# Patient Record
Sex: Female | Born: 1950
Health system: Southern US, Community
[De-identification: ages and names within clinical notes are randomized; demographics above are authoritative.]

## PROBLEM LIST (undated history)

## (undated) DIAGNOSIS — F419 Anxiety disorder, unspecified: Secondary | ICD-10-CM

## (undated) DIAGNOSIS — F32A Depression, unspecified: Secondary | ICD-10-CM

## (undated) DIAGNOSIS — E079 Disorder of thyroid, unspecified: Secondary | ICD-10-CM

## (undated) DIAGNOSIS — I10 Essential (primary) hypertension: Secondary | ICD-10-CM

## (undated) DIAGNOSIS — M1990 Unspecified osteoarthritis, unspecified site: Secondary | ICD-10-CM

## (undated) DIAGNOSIS — F4321 Adjustment disorder with depressed mood: Secondary | ICD-10-CM

## (undated) DIAGNOSIS — D649 Anemia, unspecified: Secondary | ICD-10-CM

## (undated) DIAGNOSIS — Z87440 Personal history of urinary (tract) infections: Secondary | ICD-10-CM

## (undated) DIAGNOSIS — T4145XA Adverse effect of unspecified anesthetic, initial encounter: Secondary | ICD-10-CM

## (undated) DIAGNOSIS — R112 Nausea with vomiting, unspecified: Secondary | ICD-10-CM

## (undated) DIAGNOSIS — T8859XA Other complications of anesthesia, initial encounter: Secondary | ICD-10-CM

## (undated) DIAGNOSIS — F418 Other specified anxiety disorders: Secondary | ICD-10-CM

## (undated) DIAGNOSIS — Z9889 Other specified postprocedural states: Secondary | ICD-10-CM

## (undated) DIAGNOSIS — F329 Major depressive disorder, single episode, unspecified: Secondary | ICD-10-CM

## (undated) HISTORY — PX: TOTAL THYROIDECTOMY: SHX2547

## (undated) HISTORY — DX: Unspecified osteoarthritis, unspecified site: M19.90

## (undated) HISTORY — DX: Anxiety disorder, unspecified: F41.9

## (undated) HISTORY — PX: SHOULDER SURGERY: SHX246

## (undated) HISTORY — PX: PARATHYROIDECTOMY: SHX19

## (undated) HISTORY — DX: Disorder of thyroid, unspecified: E07.9

## (undated) HISTORY — DX: Adjustment disorder with depressed mood: F43.21

## (undated) HISTORY — PX: EYE SURGERY: SHX253

## (undated) HISTORY — PX: APPENDECTOMY: SHX54

## (undated) HISTORY — DX: Major depressive disorder, single episode, unspecified: F32.9

## (undated) HISTORY — PX: COLONOSCOPY: SHX174

## (undated) HISTORY — DX: Depression, unspecified: F32.A

## (undated) HISTORY — DX: Other specified anxiety disorders: F41.8

---

## 1992-02-28 HISTORY — PX: TUBAL LIGATION: SHX77

## 1993-02-27 HISTORY — PX: OTHER SURGICAL HISTORY: SHX169

## 1998-05-07 ENCOUNTER — Emergency Department (HOSPITAL_COMMUNITY): Admission: EM | Admit: 1998-05-07 | Discharge: 1998-05-07 | Payer: Self-pay | Admitting: *Deleted

## 1998-05-07 ENCOUNTER — Encounter: Payer: Self-pay | Admitting: Emergency Medicine

## 1998-11-24 ENCOUNTER — Other Ambulatory Visit: Admission: RE | Admit: 1998-11-24 | Discharge: 1998-11-24 | Payer: Self-pay | Admitting: Obstetrics and Gynecology

## 1999-02-28 HISTORY — PX: OTHER SURGICAL HISTORY: SHX169

## 1999-03-29 ENCOUNTER — Encounter: Admission: RE | Admit: 1999-03-29 | Discharge: 1999-03-29 | Payer: Self-pay | Admitting: Obstetrics and Gynecology

## 1999-03-29 ENCOUNTER — Encounter: Payer: Self-pay | Admitting: Obstetrics and Gynecology

## 1999-07-13 ENCOUNTER — Encounter: Payer: Self-pay | Admitting: Orthopedic Surgery

## 1999-07-19 ENCOUNTER — Inpatient Hospital Stay (HOSPITAL_COMMUNITY): Admission: RE | Admit: 1999-07-19 | Discharge: 1999-07-23 | Payer: Self-pay | Admitting: Orthopedic Surgery

## 1999-11-21 ENCOUNTER — Other Ambulatory Visit: Admission: RE | Admit: 1999-11-21 | Discharge: 1999-11-21 | Payer: Self-pay | Admitting: Obstetrics and Gynecology

## 2001-04-15 ENCOUNTER — Encounter: Payer: Self-pay | Admitting: Obstetrics and Gynecology

## 2001-04-15 ENCOUNTER — Encounter: Admission: RE | Admit: 2001-04-15 | Discharge: 2001-04-15 | Payer: Self-pay | Admitting: Obstetrics and Gynecology

## 2001-08-06 ENCOUNTER — Encounter: Admission: RE | Admit: 2001-08-06 | Discharge: 2001-08-06 | Payer: Self-pay | Admitting: Obstetrics and Gynecology

## 2001-09-19 ENCOUNTER — Other Ambulatory Visit: Admission: RE | Admit: 2001-09-19 | Discharge: 2001-09-19 | Payer: Self-pay | Admitting: Obstetrics and Gynecology

## 2001-09-27 HISTORY — PX: BREAST REDUCTION SURGERY: SHX8

## 2002-09-24 ENCOUNTER — Other Ambulatory Visit: Admission: RE | Admit: 2002-09-24 | Discharge: 2002-09-24 | Payer: Self-pay | Admitting: Obstetrics and Gynecology

## 2003-09-28 DIAGNOSIS — F418 Other specified anxiety disorders: Secondary | ICD-10-CM

## 2003-09-28 HISTORY — DX: Other specified anxiety disorders: F41.8

## 2003-09-30 ENCOUNTER — Other Ambulatory Visit: Admission: RE | Admit: 2003-09-30 | Discharge: 2003-09-30 | Payer: Self-pay | Admitting: Obstetrics and Gynecology

## 2003-11-11 ENCOUNTER — Encounter: Admission: RE | Admit: 2003-11-11 | Discharge: 2003-11-11 | Payer: Self-pay | Admitting: *Deleted

## 2005-01-02 ENCOUNTER — Other Ambulatory Visit: Admission: RE | Admit: 2005-01-02 | Discharge: 2005-01-02 | Payer: Self-pay | Admitting: Obstetrics and Gynecology

## 2006-04-26 ENCOUNTER — Other Ambulatory Visit: Admission: RE | Admit: 2006-04-26 | Discharge: 2006-04-26 | Payer: Self-pay | Admitting: Obstetrics & Gynecology

## 2006-05-15 ENCOUNTER — Encounter: Admission: RE | Admit: 2006-05-15 | Discharge: 2006-05-15 | Payer: Self-pay | Admitting: Obstetrics & Gynecology

## 2007-08-28 HISTORY — PX: THUMB FUSION: SUR636

## 2007-09-30 ENCOUNTER — Encounter: Payer: Self-pay | Admitting: Family Medicine

## 2007-10-01 ENCOUNTER — Encounter: Payer: Self-pay | Admitting: Family Medicine

## 2007-10-01 ENCOUNTER — Other Ambulatory Visit: Admission: RE | Admit: 2007-10-01 | Discharge: 2007-10-01 | Payer: Self-pay | Admitting: Obstetrics and Gynecology

## 2007-10-01 LAB — CONVERTED CEMR LAB: Pap Smear: NORMAL

## 2007-10-29 ENCOUNTER — Ambulatory Visit: Payer: Self-pay | Admitting: Family Medicine

## 2007-10-29 DIAGNOSIS — N951 Menopausal and female climacteric states: Secondary | ICD-10-CM | POA: Insufficient documentation

## 2007-10-29 DIAGNOSIS — M1990 Unspecified osteoarthritis, unspecified site: Secondary | ICD-10-CM | POA: Insufficient documentation

## 2007-12-12 ENCOUNTER — Encounter: Payer: Self-pay | Admitting: Family Medicine

## 2008-11-09 ENCOUNTER — Encounter: Admission: RE | Admit: 2008-11-09 | Discharge: 2008-11-09 | Payer: Self-pay | Admitting: Obstetrics & Gynecology

## 2010-03-20 ENCOUNTER — Encounter: Payer: Self-pay | Admitting: Endocrinology

## 2010-03-27 LAB — CONVERTED CEMR LAB
Albumin: 4.3 g/dL (ref 3.5–5.2)
CO2: 29 meq/L (ref 19–32)
Calcium: 11.2 mg/dL — ABNORMAL HIGH (ref 8.4–10.5)
GFR calc Af Amer: 74 mL/min
GFR calc non Af Amer: 61 mL/min
Glucose, Bld: 84 mg/dL (ref 70–99)
Potassium: 3.9 meq/L (ref 3.5–5.1)
Sodium: 142 meq/L (ref 135–145)
TSH: 1.56 microintl units/mL (ref 0.35–5.50)

## 2010-05-19 ENCOUNTER — Encounter: Payer: Self-pay | Admitting: Family Medicine

## 2010-05-19 ENCOUNTER — Ambulatory Visit (INDEPENDENT_AMBULATORY_CARE_PROVIDER_SITE_OTHER): Payer: PRIVATE HEALTH INSURANCE | Admitting: Family Medicine

## 2010-05-19 VITALS — BP 140/90 | HR 59 | Temp 98.1°F | Wt 130.0 lb

## 2010-05-19 DIAGNOSIS — E785 Hyperlipidemia, unspecified: Secondary | ICD-10-CM

## 2010-05-19 DIAGNOSIS — E7800 Pure hypercholesterolemia, unspecified: Secondary | ICD-10-CM

## 2010-05-19 DIAGNOSIS — E213 Hyperparathyroidism, unspecified: Secondary | ICD-10-CM

## 2010-05-19 DIAGNOSIS — N19 Unspecified kidney failure: Secondary | ICD-10-CM

## 2010-05-19 DIAGNOSIS — M129 Arthropathy, unspecified: Secondary | ICD-10-CM

## 2010-05-19 DIAGNOSIS — M1990 Unspecified osteoarthritis, unspecified site: Secondary | ICD-10-CM

## 2010-05-19 DIAGNOSIS — E039 Hypothyroidism, unspecified: Secondary | ICD-10-CM

## 2010-05-19 MED ORDER — DICLOFENAC SODIUM 75 MG PO TBEC: 75.0000 mg | DELAYED_RELEASE_TABLET | Freq: Two times a day (BID) | ORAL | Status: DC

## 2010-05-19 NOTE — Patient Instructions (Addendum)
Take celebrex until finished 1 twice daily then start diclofenac fo arthrits We'll call results of lab studies

## 2010-05-20 LAB — BASIC METABOLIC PANEL
BUN: 15 mg/dL (ref 6–23)
CO2: 27 mEq/L (ref 19–32)
Calcium: 10.2 mg/dL (ref 8.4–10.5)
Creatinine, Ser: 0.9 mg/dL (ref 0.4–1.2)
GFR: 67.34 mL/min (ref >60.00)
Glucose, Bld: 80 mg/dL (ref 70–99)
Sodium: 140 mEq/L (ref 135–145)

## 2010-05-20 LAB — HEPATIC FUNCTION PANEL
ALT: 19 U/L (ref 0–35)
Albumin: 4.4 g/dL (ref 3.5–5.2)
Bilirubin, Direct: 0.1 mg/dL (ref 0.0–0.3)
Total Protein: 7.1 g/dL (ref 6.0–8.3)

## 2010-05-20 LAB — LDL CHOLESTEROL, DIRECT: Direct LDL: 148.7 mg/dL

## 2010-05-20 LAB — LIPID PANEL
Cholesterol: 226 mg/dL — ABNORMAL HIGH (ref 0–200)
HDL: 65.3 mg/dL (ref >39.00)
Triglycerides: 70 mg/dL (ref 0.0–149.0)

## 2010-05-23 ENCOUNTER — Encounter: Payer: Self-pay | Admitting: Family Medicine

## 2010-05-23 NOTE — Progress Notes (Signed)
  Subjective:    Patient ID: Cassandra Mcfarland, female    DOB: 04-Jan-1951, 60 y.o.   MRN: [id number] This 60 year old white married tract is pleasant female applied to go obtained her left wrist which the left thumb has been fused also pain in the left knee which has persisted and she had been taken Aleve and Advil as needed for pain is never completely gone. She is postmenopausal and her menopausal symptoms are controlled with  Estrogen and testosterone combination of 0.625-0.25 mg Patient desires to have laboratory studies done especially parathyroid level which is been elevated in the past also lipids and calcium level be done, had been under the care of an endocrinologist Problem with hypercalcemia and passed it also having some anxiety and insomnia problems episodically HPI    Review of Systems patient has no complaints see history of present illness, review of systems negative otherwise     Objective:   Physical Exam patient is well well-nourished female who is in no distress but is complaining of pain of her left hand and left knee HEENT negative carotid pulses negative thyroid nonpalpable Heart pulse size no murmurs Lungs are clear to palpation percussion and auscultation no rales no wheezing Breasts not examined Abdominal exam negative liver spleen kidney nonpalpable no masses no tenderness Marked tenderness of the left wrist and base the left thumb Right knee replaced, tenderness left knee medial and lateral aspect No edema        Assessment & Plan:  Osteoarthritis to treat with Celebrex for 2 months and followup with diclofenac Hypercalcemia plan do have lab studies Hyperparathyroidism to evaluate level PTH Anxiety treat with alprazolam

## 2010-05-30 ENCOUNTER — Other Ambulatory Visit (HOSPITAL_COMMUNITY): Payer: Self-pay | Admitting: Endocrinology

## 2010-06-09 ENCOUNTER — Encounter (HOSPITAL_COMMUNITY)
Admission: RE | Admit: 2010-06-09 | Discharge: 2010-06-09 | Disposition: A | Discharge status: Home / Self Care | Payer: PRIVATE HEALTH INSURANCE | Source: Ambulatory Visit | Attending: Endocrinology | Admitting: Endocrinology

## 2010-06-09 ENCOUNTER — Ambulatory Visit (HOSPITAL_COMMUNITY)
Admission: RE | Admit: 2010-06-09 | Discharge: 2010-06-09 | Disposition: A | Discharge status: Home / Self Care | Payer: PRIVATE HEALTH INSURANCE | Source: Ambulatory Visit | Attending: Endocrinology | Admitting: Endocrinology

## 2010-06-09 DIAGNOSIS — E213 Hyperparathyroidism, unspecified: Secondary | ICD-10-CM | POA: Insufficient documentation

## 2010-06-09 MED ORDER — TECHNETIUM TC 99M SESTAMIBI - CARDIOLITE
25.0000 | Freq: Once | INTRAVENOUS | Status: AC | PRN
  Administered 2010-06-09: 17:00:00 26.2 via INTRAVENOUS

## 2010-06-20 ENCOUNTER — Encounter (HOSPITAL_COMMUNITY): Payer: PRIVATE HEALTH INSURANCE

## 2010-06-20 ENCOUNTER — Telehealth: Payer: Self-pay | Admitting: Family Medicine

## 2010-06-20 MED ORDER — ALPRAZOLAM 0.5 MG PO TABS: 0.5000 mg | ORAL_TABLET | Freq: Three times a day (TID) | ORAL | Status: DC | PRN

## 2010-06-20 NOTE — Telephone Encounter (Signed)
Patient would like for Dr Scotty Court to return her call. Has some issues that she would like to discuss.

## 2010-06-20 NOTE — Telephone Encounter (Signed)
Call from pharmasit at [facility] wanted to get xanax filled---ok to fill for 6 months per Dr. Scotty Court

## 2010-06-21 ENCOUNTER — Ambulatory Visit (HOSPITAL_COMMUNITY)
Admission: RE | Admit: 2010-06-21 | Discharge: 2010-06-21 | Disposition: A | Discharge status: Home / Self Care | Payer: PRIVATE HEALTH INSURANCE | Source: Ambulatory Visit | Attending: Surgery | Admitting: Surgery

## 2010-06-21 ENCOUNTER — Other Ambulatory Visit: Payer: Self-pay | Admitting: Surgery

## 2010-06-21 ENCOUNTER — Other Ambulatory Visit (HOSPITAL_COMMUNITY): Payer: Self-pay | Admitting: Surgery

## 2010-06-21 ENCOUNTER — Encounter (HOSPITAL_COMMUNITY): Payer: PRIVATE HEALTH INSURANCE

## 2010-06-21 DIAGNOSIS — E079 Disorder of thyroid, unspecified: Secondary | ICD-10-CM

## 2010-06-21 DIAGNOSIS — Z01812 Encounter for preprocedural laboratory examination: Secondary | ICD-10-CM | POA: Insufficient documentation

## 2010-06-21 DIAGNOSIS — Z01810 Encounter for preprocedural cardiovascular examination: Secondary | ICD-10-CM | POA: Insufficient documentation

## 2010-06-21 DIAGNOSIS — Z01818 Encounter for other preprocedural examination: Secondary | ICD-10-CM | POA: Insufficient documentation

## 2010-06-21 DIAGNOSIS — I10 Essential (primary) hypertension: Secondary | ICD-10-CM | POA: Insufficient documentation

## 2010-06-21 LAB — CBC
HCT: 41.6 % (ref 36.0–46.0)
Hemoglobin: 13.6 g/dL (ref 12.0–15.0)
MCH: 29.1 pg (ref 26.0–34.0)
MCHC: 32.7 g/dL (ref 30.0–36.0)
RBC: 4.67 MIL/uL (ref 3.87–5.11)

## 2010-06-21 LAB — DIFFERENTIAL
Eosinophils Absolute: 0.1 10*3/uL (ref 0.0–0.7)
Lymphs Abs: 1.9 10*3/uL (ref 0.7–4.0)
Monocytes Absolute: 0.3 10*3/uL (ref 0.1–1.0)
Monocytes Relative: 7 % (ref 3–12)
Neutrophils Relative %: 56 % (ref 43–77)

## 2010-06-21 LAB — BASIC METABOLIC PANEL
BUN: 11 mg/dL (ref 6–23)
CO2: 25 mEq/L (ref 19–32)
Calcium: 10.8 mg/dL — ABNORMAL HIGH (ref 8.4–10.5)
Glucose, Bld: 91 mg/dL (ref 70–99)
Potassium: 4.7 mEq/L (ref 3.5–5.1)
Sodium: 139 mEq/L (ref 135–145)

## 2010-06-21 LAB — URINALYSIS, ROUTINE W REFLEX MICROSCOPIC
Bilirubin Urine: NEGATIVE
Glucose, UA: NEGATIVE mg/dL
Hgb urine dipstick: NEGATIVE
Ketones, ur: NEGATIVE mg/dL
Nitrite: NEGATIVE
Specific Gravity, Urine: 1.011 (ref 1.005–1.030)
pH: 7 (ref 5.0–8.0)

## 2010-06-21 LAB — SURGICAL PCR SCREEN
MRSA, PCR: NEGATIVE
Staphylococcus aureus: NEGATIVE

## 2010-06-28 ENCOUNTER — Ambulatory Visit (HOSPITAL_COMMUNITY)
Admission: RE | Admit: 2010-06-28 | Discharge: 2010-06-28 | Disposition: A | Discharge status: Home / Self Care | Payer: PRIVATE HEALTH INSURANCE | Source: Ambulatory Visit | Attending: Surgery | Admitting: Surgery

## 2010-06-28 ENCOUNTER — Other Ambulatory Visit: Payer: Self-pay | Admitting: Surgery

## 2010-06-28 DIAGNOSIS — I10 Essential (primary) hypertension: Secondary | ICD-10-CM | POA: Insufficient documentation

## 2010-06-28 DIAGNOSIS — IMO0002 Reserved for concepts with insufficient information to code with codable children: Secondary | ICD-10-CM | POA: Insufficient documentation

## 2010-06-28 DIAGNOSIS — Z79899 Other long term (current) drug therapy: Secondary | ICD-10-CM | POA: Insufficient documentation

## 2010-06-28 DIAGNOSIS — E213 Hyperparathyroidism, unspecified: Secondary | ICD-10-CM | POA: Insufficient documentation

## 2010-06-28 DIAGNOSIS — Z96659 Presence of unspecified artificial knee joint: Secondary | ICD-10-CM | POA: Insufficient documentation

## 2010-06-28 DIAGNOSIS — M1710 Unilateral primary osteoarthritis, unspecified knee: Secondary | ICD-10-CM | POA: Insufficient documentation

## 2010-07-02 NOTE — Op Note (Signed)
Cassandra Mcfarland, Cassandra Mcfarland                ACCOUNT NO.:  [id number]  MEDICAL RECORD NO.:  [id number]           PATIENT TYPE:  O  LOCATION:  DAYL                         FACILITY:  [facility]  PHYSICIAN:  Velora Heckler, MD      DATE OF BIRTH:  1950-12-19  DATE OF PROCEDURE:  06/28/2010                               OPERATIVE REPORT   PREOPERATIVE DIAGNOSIS:  Primary hyperparathyroidism.  POSTOPERATIVE DIAGNOSIS:  Primary hyperparathyroidism.  PROCEDURE:  Left inferior minimally invasive parathyroidectomy.  SURGEON:  Velora Heckler, MD, FACS  ANESTHESIA:  General.  ESTIMATED BLOOD LOSS:  Minimal.  PREPARATION:  ChloraPrep.  COMPLICATIONS:  None.  INDICATIONS:  The patient is a 60 year old white female from [place], [place].  The patient has had elevated serum calcium levels for approximately 5 years.  This was picked up on routine laboratory work.  She also has hypertension which has become more difficult to control recently.  The patient was seen by Dr. Talmage Nap. Laboratory work showed an elevated calcium of 11.2 with intact PTH level of 112.  Nuclear medicine parathyroid scan was obtained which localized activity to the left inferior position.  The patient now comes to surgery for neck exploration.  BODY OF REPORT.:  Procedure was done in OR #1 at the [facility].  The patient was brought to the operating room, placed in supine position on the operating room table.  Following administration of general anesthesia, the patient was positioned and then prepped and draped in the usual strict aseptic fashion.  After ascertaining that an adequate level of anesthesia had been achieved, an inferior left neck incision was made with a #15 blade.  Dissection was carried through subcutaneous tissues and platysma.  Subplatysmal flaps were developed circumferentially with the electrocautery.  A Weitlaner retractor was placed for exposure.  Strap muscles were incised in  the midline and reflected to the left exposing the inferior pole of the left thyroid lobe.  Exploration just beneath the inferior pole in the tracheoesophageal groove reveals an enlarged parathyroid gland.  This was gently dissected out.  Vascular structures were divided between small Ligaclips and the gland was excised.  It measured approximately 1 cm in diameter.  It was submitted to pathology.  Frozen section by Dr. Miki Kins confirms hyperplastic parathyroid tissue consistent with parathyroid adenoma.  Good hemostasis was achieved.  Surgicel was placed in the operative field.  Strap muscles were reapproximated in the midline with interrupted 3-0 Vicryl sutures.  Platysma was closed with interrupted 3- 0 Vicryl sutures.  Localanesthetic was infiltrated circumferentially.  Skin was closed with running 4-0 Monocryl subcuticular suture.  Wound was washed and dried, and Benzoin and Steri-Strips were applied.  The patient was awakened from anesthesia and brought to the recovery room.  The patient tolerated the procedure well.   Velora Heckler, MD, FACS     TMG/MEDQ  D:  06/28/2010  T:  06/28/2010  Job:  [id number]  cc:   Dorisann Frames, M.D. Fax: [phone]  Ellin Saba., MD [address] [place] [address]  Electronically Signed by Darnell Level MD  on 07/02/2010 12:39:56 PM

## 2010-07-15 NOTE — Discharge Summary (Signed)
[facility]  Patient:    Cassandra Mcfarland, Cassandra Mcfarland                 MRN: [id number] Adm. Date:  [id number] Disc. Date: [id number] Attending:  Loanne Drilling Dictator:   Marveen Reeks Dasnoit, P.A.                           Discharge Summary  ADMISSION DIAGNOSIS:  End-stage osteoarthritic degeneration of the right knee.  DISCHARGE DIAGNOSIS:  End-stage osteoarthritic degeneration of the right knee.  OPERATION PERFORMED:  Right total knee replacement.  HISTORY:  Patient is a 60 year old white female who has been followed by Dr. Ollen Gross for continued right knee pain.  She has a history of right knee pain that dates back to approximately 12 to 15 years ago when she developed medial-sided pain with mechanical symptoms.  At that time, she was doing some very heavy-impact aerobics and heavy exercise.  She underwent previous arthroscopic surgery and found to have medial compartment arthritis with degeneration.  Her pain has been progressive.  She has undergone a series of Hyalgan injections, followed by a series of Synvisc injections without relief.  It was felt that she would eventually need to require total knee replacement.  She was referred to Dr. Zandra Abts for consideration of unicompartmental replacement.  She presented to Dr. Lequita Halt for a second opinion.  It was felt that because of her amount of degeneration, that she would need to undergo total knee replacement.  The procedure, risks, benefits, complications and postoperative course were discussed with the patient at length and she was in agreement with this plan.  LABORATORY AND X-RAY FINDINGS:  Labs on admission revealed a hemoglobin of 13.8 with hematocrit of 40.6, WBC of 5.8 and 249,000 platelets.  Coagulation studies within normal limits.  Chemistry profile revealed a slightly elevated calcium at 10.6 and was otherwise normal.  Urinalysis was normal.  EKG revealed normal sinus rhythm.  Chest  x-ray revealed no cardiopulmonary disease.  HOSPITAL COURSE:  Patient was admitted after undergoing the above-listed surgery Dr. Ollen Gross.  She was started on Ancef for antibiotic prophylaxis; she was also started on Coumadin for DVT prophylaxis.  Physical therapy was begun at weightbearing as tolerated.  First day postop, patient states she had a rough night.  Hemoglobin was 10.6.  She was afebrile with stable vital signs.  She was having a rough time with her CPM, especially with full extension.  Hemovac was clotted and was therefore removed without complication.  Her dressing was dry.  There was minimal swelling.  We added Protonix for some mild nausea and reflux.  She was placed in ice packs.  The following day, on Jul 21, 1999, she was afebrile, feeling much better that day.  Dressing was changed and the wound itself looked very good.  Hemoglobin was stable at 10.2 with hematocrit of 30.6.  There was no hemarthrosis. Physical therapy was progressing very nicely.  On Jul 22, 1999, her third day postop, hemoglobin was 9.7 with hematocrit of 29.4, doing well overall, her pain was improving and there were no complaints of shortness of breath, chest pain or calf pain.  Physical therapy was progressing nicely.  She had a good appetite, voiding without difficulty, no bowel movement yet but she was moving gas.  CPM was 0-65 degrees and she was tolerating this well.  On exam, her dressing was dry, she had minimal swelling, there was  no drainage, her calf was soft.  The following day, on Jul 23, 1999, her fourth day postop, she remained afebrile with stable vital signs, she was doing very well overall, her pain was markedly decreased, her mobility was improving, she had a good appetite and voiding well.  Her physical therapy had progressed to the point where she was ready for discharge from their standpoint.  There were no complaints of shortness of breath, chest pain or calf pain.  On  exam, her dressing was dry, neurovascular and motor functions were intact, her calf was soft.  Plan at this time:  The patient was felt to be stable and ready for discharge to home.  DISCHARGE MEDICATIONS: 1. Percocet 5 mg one or two p.o. q.4-6h. p.r.n. pain. 2. Robaxin 500 mg one p.o. q.8h. p.r.n. muscle spasms. 3. Coumadin 2.5 mg one p.o. q.d. at 6 p.m. or as directed.  DIET:  Regular.  ACTIVITY:  She will continue to ambulate with a walker.  She will be weightbearing as tolerated.  RETURN APPOINTMENT:  She will return to see Dr. Ollen Gross in our office in 10 days time or sooner; she will call for an appointment.  WOUND CARE:  She will continue with daily dressing changes.  Patient started showering on Jul 22, 1999 and she can continue this as tolerated.  ADDITIONAL INSTRUCTIONS:  She will be seen as an outpatient at [facility] for outpatient physical therapy; she will also come to [facility] for blood draws for her pro times, with the first being on Jul 26, 1999.  CONDITION ON DISCHARGE:  Stable and improving.  FINAL DISCHARGE DIAGNOSIS:  End-stage osteoarthritic degeneration of the right knee requiring right total knee replacement. DD:  07/26/99 TD:  07/29/99 Job: [id number]

## 2010-07-15 NOTE — Op Note (Signed)
[facility]  Patient:    Cassandra Mcfarland, Cassandra Mcfarland                 MRN: [id number] Proc. Date: 07/19/99 Adm. Date:  [id number] Disc. Date: [id number] Attending:  Ollen Gross V                           Operative Report  PREOPERATIVE DIAGNOSIS:  Osteoarthritis, right knee.  POSTOPERATIVE DIAGNOSIS:  Osteoarthritis, right knee.  PROCEDURE:  Right total knee arthroplasty.  SURGEON:  Georgena Spurling, M.D.  ASSISTANT:  Druscilla Brownie. Shela Nevin, P.A.  ANESTHESIA:  Spinal.  ESTIMATED BLOOD LOSS:  Minimal.  DRAIN:  Hemovac x 1.  TOURNIQUET TIME:  50 minutes at 350 mmHg.  COMPLICATIONS:  None.  CONDITION:  Stable to recovery.  BRIEF CLINICAL NOTE:  Cassandra Mcfarland is a 60 year old female who has had significant right knee pain for several years now.  She has failed arthroscopic debridement and multiple injections.  She is getting progressively increasing pain and has bone-on-bone changes medially.  She presents now for total knee arthroplasty.  DESCRIPTION OF PROCEDURE:  After successful administration of spinal anesthetic, a tourniquet was placed high on the right thigh and the right lower extremity prepped and draped in the usual sterile fashion.  The extremity was wrapped in Esmarch, knee flexed, the tourniquet inflated to 350 mmHg.  Standard midline incision was made, skin cut with a 10 blade through subcutaneous tissue to the level of the extensor mechanism.  A fresh blade was used to make a medial parapatellar arthrotomy.  Soft tissue over the medial proximal tibia, subperiosteal elevation to the joint line with a knife, and into the semimembranosus bursa with a curved osteotome.  The lateral patellofemoral ligament then cut, the patella everted, the knee flexed 90 degrees.  ACL and PCL were removed.  A drill was used to make a starting hole in the distal femur.  Of note, she has bone-on-bone changes in the medial compartment as well as defect down to bone  in the patella.  Once the drill hole was created, the canal was irrigated and then the liner guide placed 5 degrees right valgus.  Referencing off the femoral condyles, rotation was marked and the block pinned to remove 9 mm off the distal femur.  Distal femoral resection was made with an oscillating saw.  Sizing block was placed, and 2.5 was the most appropriate size.  The 3 degree external rotation jig matched her epicondylar axis.  The cutting block was placed and the anterior and posterior cuts made.  Tibia was then subluxed forward and the menisci are removed.  Extramedullary tibial alignment guide was then placed and referencing proximally at the medial aspect of the tibial tubercle and distally along the tibial crest and second metatarsal axis.  The block is subsequently pinned so as to remove 10 mm off the nondeficient lateral side. Resection was made with an oscillating saw.  A size 2.5 was the most appropriate tibial size also.  The intercondylar cutting block was then placed on the femur and intercondylar and chamfer cuts are made.  The femoral trial size 2.5 posterior stabilizer is placed with a 2.5 tibia and a 10 mm posterior stabilized rotating platform insert.  She achieved full extension with excellent varus-valgus balance, past 90 degrees of flexion.  At this point, the patella is everted, thickness measured to be 21, and a free-hand resection taken down to 11 mm.  A 38  mm template is placed, and the lug holes are drilled.  Trial patella is placed and tracks normally.  At this point, the trial components are removed, the tibial base plate is placed back on the tibia, and pinned into position and the modular drill and keel punch utilized.  Cut bony surface was then prepared with pulsatile lavage and cement mix.  Once ready for implantation, the tibia, then femur and patella cemented in place, and the patella was held with the patella clamp.  Extruded cement was removed.   Trial 10 mm insert was placed, the knee held in full extension, and all the extruded cement is removed.  Once the cement is fully hardened, the patella clamp is removed and the permanent permanent 10 mm posterior stabilized rotating platform inserted, placed into the tibial tray.  Once again, she has excellent stability throughout and full motion.  The wound was then copiously irrigated with antibiotic solution and the extensor mechanism closed over one limb of the Hemovac drain with interrupted #1 PDS.  The tourniquet was then released with a total time of 50 minutes.  The subcutaneous tissue was closed with interrupted 2-0 Vicryl, subcuticular with running 4-0 Monocryl.  The incision was clean and dry and Steri-Strips and a bulky sterile dressing applied.  Drain was hooked to suction and the patient was awakened, placed into a knee immobilizer, and transported to recovery room in stable condition. DD:  07/19/99 TD:  07/23/99 Job: [id number]

## 2010-07-15 NOTE — H&P (Signed)
[facility]  Patient:    Cassandra Mcfarland, Cassandra Mcfarland                      MRN: [id number] Adm. Date:  07/19/99 Attending:  Ollen Gross, M.D. Dictator:   Alexzandrew L. Julien Girt, P.A.-C. CC:         Ollen Gross, M.D.             Leroy Sea., M.D.                         History and Physical  Date of office visit and history and physical, Jul 13, 1999.  DATE OF BIRTH:  17-Apr-1950  CHIEF COMPLAINT:  Right knee pain.  HISTORY OF PRESENT ILLNESS:  The patient is a 60 year old female who comes in and is evaluated by Dr. Ollen Gross for a continued right knee pain.  She comes in for evaluation for surgery.  She has a history of right knee pain that dates back to approximately 12-15 years ago when she developed medial side pain and mechanical symptoms.  At that time she was doing very heavy impact aerobics and heavy exercise.  She was seen by Dr. Kristeen Miss in the past and underwent arthroscopic surgery and found to have medial compartment arthritis and degeneration.  Her pain has been progressive.  She has undergone a series of Hyalgan injections followed by a series of synvisc injections without relief.  It was felt at that time that she would eventually require total knee replacement arthroplasty.  She was subsequently referred to Dr. Zandra Abts at [facility] for consideration of a unicondylar replacement.  She came for a second opinion.  Risks and benefits of the procedures were discussed at length with the patient and after long consideration she has decided to proceed with total knee replacement arthroplasty.  Risks and benefits of the procedure have been reviewed with the patient and she has elected to proceed with surgery.  ALLERGIES:  TETANUS INJECTION caused swelling and breathing difficulty. INTOLERANCES:  CODEINE causes nausea.  CURRENT MEDICATIONS:  Glucosamine and vitamins.  PAST MEDICAL HISTORY:  Osteoarthritis.  History of right wrist  fracture. History of left rib fractures.  PAST SURGICAL HISTORY:  Appendectomy, tubal ligation, abdominoplasty, face lift, rhinoplasty, knee arthroscopy.  SOCIAL HISTORY:  Married, two children.  Denies the use of tobacco products. Very seldom intake of alcohol.  FAMILY HISTORY:  Father, age 62, deceased secondary to a gunshot wound accident.  Mother deceased, age 26, secondary to lymphocytic leukemia.  REVIEW OF SYSTEMS:  General:  No fever, chills, or night sweats.  Neuro:  No seizures, syncope, or paralysis.  Respiratory:  No shortness of breath, productive cough, or hemoptysis.  Cardiovascular:  No chest pain, angina, orthopnea.  GI:  No nausea, vomiting, diarrhea, or constipation.  GU:  No dysuria, hematuria, or discharge.  Musculoskeletal:  Pertinent to that of the right knee found in the history of present illness.  PHYSICAL EXAMINATION:  VITAL SIGNS:  Pulse 64, respirations 14, blood pressure 132/82.  GENERAL:  The patient is a 60 year old white female, thin frame, well-developed, well-nourished.  Appears to be in no acute distress.  She is alert, oriented and cooperative at the time of exam.  HEENT:  Normocephalic, atraumatic.  Pupils round, reactive.  Oropharynx is clear.  EOMs are intact.  NECK:  Supple.  CHEST:  Clear to auscultation and percussion.  HEART:  Regular rate and rhythm.  No murmurs.  ABDOMEN:  Soft, flat abdomen, nontender to palpation.  Bowel sounds present. No rebound or guarding.  RECTAL, BREASTS, GENITALIA:  Not done.  Not pertinent to present illness.  EXTREMITIES:  Significant to the right lower extremity.  The patients range of motion of the right knee is 0-135 degrees.  She has no palpable effusion. Tender to palpation along the medial joint line.  Motor function is intact. There is 5/5 strength in the right leg.  X-RAYS:  X-rays taken in the office show bone-on-bone degeneration and arthritic changes in the right knee.  IMPRESSION:   End-stage osteoarthritis, right knee.  PLAN: The patient will be admitted to [facility] to undergo a right total knee replacement arthroplasty.  The patient was seen by her medical physician, Dr. Scotty Court, for preoperative medical clearance.  She has been cleared to proceed with surgery.  Dr. Scotty Court will be notified of the room number on admission and will be consulted if needed for any medical assistance with this patient during the hospital course. DD:  07/14/99 TD:  07/14/99 Job: [id number]

## 2010-07-24 ENCOUNTER — Emergency Department (HOSPITAL_COMMUNITY)
Admission: EM | Admit: 2010-07-24 | Discharge: 2010-07-25 | Disposition: A | Discharge status: Home / Self Care | Payer: PRIVATE HEALTH INSURANCE | Attending: Emergency Medicine | Admitting: Emergency Medicine

## 2010-07-24 ENCOUNTER — Emergency Department (HOSPITAL_COMMUNITY): Payer: PRIVATE HEALTH INSURANCE

## 2010-07-24 DIAGNOSIS — I10 Essential (primary) hypertension: Secondary | ICD-10-CM | POA: Insufficient documentation

## 2010-07-24 DIAGNOSIS — R55 Syncope and collapse: Secondary | ICD-10-CM | POA: Insufficient documentation

## 2010-07-24 DIAGNOSIS — R040 Epistaxis: Secondary | ICD-10-CM | POA: Insufficient documentation

## 2010-07-24 DIAGNOSIS — R42 Dizziness and giddiness: Secondary | ICD-10-CM | POA: Insufficient documentation

## 2010-07-24 DIAGNOSIS — R110 Nausea: Secondary | ICD-10-CM | POA: Insufficient documentation

## 2010-07-24 LAB — URINALYSIS, ROUTINE W REFLEX MICROSCOPIC
Glucose, UA: NEGATIVE mg/dL
Ketones, ur: NEGATIVE mg/dL
Nitrite: NEGATIVE
Protein, ur: NEGATIVE mg/dL
Urobilinogen, UA: 0.2 mg/dL (ref 0.0–1.0)

## 2010-07-24 LAB — COMPREHENSIVE METABOLIC PANEL
ALT: 40 U/L — ABNORMAL HIGH (ref 0–35)
Alkaline Phosphatase: 55 U/L (ref 39–117)
BUN: 15 mg/dL (ref 6–23)
CO2: 22 mEq/L (ref 19–32)
Chloride: 105 mEq/L (ref 96–112)
GFR calc non Af Amer: >60 mL/min (ref >60)
Glucose, Bld: 101 mg/dL — ABNORMAL HIGH (ref 70–99)
Potassium: 3.5 mEq/L (ref 3.5–5.1)
Sodium: 140 mEq/L (ref 135–145)
Total Bilirubin: 0.2 mg/dL — ABNORMAL LOW (ref 0.3–1.2)

## 2010-07-24 LAB — DIFFERENTIAL
Basophils Absolute: 0 10*3/uL (ref 0.0–0.1)
Lymphocytes Relative: 41 % (ref 12–46)
Lymphs Abs: 2.4 10*3/uL (ref 0.7–4.0)
Neutro Abs: 2.9 10*3/uL (ref 1.7–7.7)

## 2010-07-24 LAB — CBC
HCT: 37.9 % (ref 36.0–46.0)
Hemoglobin: 12.9 g/dL (ref 12.0–15.0)
RBC: 4.31 MIL/uL (ref 3.87–5.11)
WBC: 5.8 10*3/uL (ref 4.0–10.5)

## 2010-07-26 ENCOUNTER — Ambulatory Visit (INDEPENDENT_AMBULATORY_CARE_PROVIDER_SITE_OTHER): Payer: PRIVATE HEALTH INSURANCE | Admitting: Family Medicine

## 2010-07-26 ENCOUNTER — Encounter: Payer: Self-pay | Admitting: Family Medicine

## 2010-07-26 VITALS — BP 160/90 | HR 81 | Temp 98.0°F | Wt 129.5 lb

## 2010-07-26 DIAGNOSIS — F411 Generalized anxiety disorder: Secondary | ICD-10-CM

## 2010-07-26 DIAGNOSIS — M7550 Bursitis of unspecified shoulder: Secondary | ICD-10-CM

## 2010-07-26 DIAGNOSIS — I10 Essential (primary) hypertension: Secondary | ICD-10-CM

## 2010-07-26 DIAGNOSIS — IMO0002 Reserved for concepts with insufficient information to code with codable children: Secondary | ICD-10-CM

## 2010-07-26 DIAGNOSIS — M75100 Unspecified rotator cuff tear or rupture of unspecified shoulder, not specified as traumatic: Secondary | ICD-10-CM

## 2010-07-26 DIAGNOSIS — F419 Anxiety disorder, unspecified: Secondary | ICD-10-CM

## 2010-07-26 MED ORDER — AMLODIPINE BESYLATE 10 MG PO TABS: ORAL_TABLET | ORAL | Status: AC

## 2010-07-26 NOTE — Patient Instructions (Addendum)
Acromioclavicular  bursitis left shoulder, no gym for 1 week Hypertension start 10mg  amlodypine daily appt for blood pressure by doctor on Tuesday Alprazolam 0.25 am and PM

## 2010-08-02 ENCOUNTER — Encounter: Payer: Self-pay | Admitting: Family Medicine

## 2010-08-02 ENCOUNTER — Ambulatory Visit (INDEPENDENT_AMBULATORY_CARE_PROVIDER_SITE_OTHER): Payer: PRIVATE HEALTH INSURANCE | Admitting: Family Medicine

## 2010-08-02 VITALS — Temp 97.7°F | Wt 132.0 lb

## 2010-08-02 DIAGNOSIS — M75100 Unspecified rotator cuff tear or rupture of unspecified shoulder, not specified as traumatic: Secondary | ICD-10-CM

## 2010-08-02 DIAGNOSIS — M7550 Bursitis of unspecified shoulder: Secondary | ICD-10-CM

## 2010-08-02 DIAGNOSIS — F419 Anxiety disorder, unspecified: Secondary | ICD-10-CM

## 2010-08-02 DIAGNOSIS — I10 Essential (primary) hypertension: Secondary | ICD-10-CM

## 2010-08-02 DIAGNOSIS — IMO0002 Reserved for concepts with insufficient information to code with codable children: Secondary | ICD-10-CM

## 2010-08-02 DIAGNOSIS — F411 Generalized anxiety disorder: Secondary | ICD-10-CM

## 2010-08-03 ENCOUNTER — Encounter (INDEPENDENT_AMBULATORY_CARE_PROVIDER_SITE_OTHER): Payer: Self-pay | Admitting: Surgery

## 2010-08-07 ENCOUNTER — Encounter: Payer: Self-pay | Admitting: Family Medicine

## 2010-08-07 MED ORDER — METHYLPREDNISOLONE ACETATE 80 MG/ML IJ SUSP: 120.0000 mg | Freq: Once | INTRAMUSCULAR | Status: DC

## 2010-08-07 NOTE — Progress Notes (Signed)
  Subjective:    Patient ID: Cassandra Mcfarland, female    DOB: 1951/01/21, 60 y.o.   MRN: [id number] This 60 year old white married female had 2 episodes of epistaxis 5 days ago and then a recurrent 3 days ago and was seen in the emergency room where she was found to have elevated blood curve her approximately 200, but has not had any bleeding since that time she is in today regarding 2 problems her blood pressure which is 160/90 as well as pain in the left shoulder. She been treated orally for acromioclavicular bursitis and that his rate turned and also painful on movement as well as at night Patient has no other complaint no headaches nor dizzinessHPI when seen in the hospital the patient was given Norvasc 10 mg p.o. As well as 5 mg metoprolol tartrate    Review of Systemssee history of present illness     Objective:   Physical Exampatient is a fit 24-year-old white female tract is cooperative and alert and no distress HEENT reveals examination of the nose a clotted area of an posterior left nostril but no congestion normal problem nasal Heart and lungs no abnormality Examination of the left shoulder reveals tenderness over the acromioclavicular bursa pain on posterior movement as well as elevation As stated above blood pressure 160/90        Assessment & Plan:  Acromioclavicular bursitis left shoulder injected with 120 mg Depo-Medrol +1.5 cc 1% lidocaine Hypertension amlodipine 10 mg q.d. Epistaxis under control Anxiety start alprazolam oh 0.25 mg in a.m. And p.m.

## 2010-08-15 NOTE — Patient Instructions (Signed)
Hypertension well controlled continue amlodipine 10 mg q. Day Subdeltoid bursitis is under control continue diclofenac 75 mg twice daily for at least another month

## 2010-08-15 NOTE — Progress Notes (Signed)
  Subjective:    Patient ID: Cassandra Mcfarland, female    DOB: 07-23-1950, 60 y.o.   MRN: [id number] This 60 year old white married female lives in the 2 followup on her recent diagnoses of hypertension as well as to discuss her progress of subdeltoid bursitis her lites also no problems with epistaxis recently no new complaints HPI    Review of Systems  Constitutional: Negative.   HENT: Negative.   Eyes: Negative.   Cardiovascular: Negative.   Gastrointestinal: Negative.   Genitourinary: Negative.   Musculoskeletal: Positive for arthralgias.  Skin: Negative.   Neurological: Negative.   Hematological: Negative.   Psychiatric/Behavioral: Negative.        Objective:   Physical Exam the patient is a well-developed wnourished white female who is in no distress cooperative and attractive blood pressure initially 140/80 repeated 136/80 Examination left shoulder reveals minimal tenderness over the subdeltoid bursa area full range of motion no limitations         Assessment & Plan:  Hypertension is well controlled continue amlodipine 10 mg each day Anxiety continue alprazolam as needed Menopausal syndrome continue Estratest h.s. And progesterone Continue diclofenac for least one month, diclofenac 75 mg b.i.d. pc

## 2010-09-05 ENCOUNTER — Encounter (INDEPENDENT_AMBULATORY_CARE_PROVIDER_SITE_OTHER): Payer: Self-pay | Admitting: Surgery

## 2010-09-05 ENCOUNTER — Ambulatory Visit (INDEPENDENT_AMBULATORY_CARE_PROVIDER_SITE_OTHER): Payer: PRIVATE HEALTH INSURANCE | Admitting: Surgery

## 2010-09-05 NOTE — Progress Notes (Signed)
HISTORY: Patient returns for followup having undergone minimally invasive parathyroidectomy on Jun 28, 2010. Laboratory studies on August 19, 2010 show a normal calcium of 9.9 and a low intact PTH level of 4.5.   PERTINENT REVIEW OF SYSTEMS: The patient complains of frequent headaches. She is having problems with control of her hypertension. She has been seen in the emergency room for nosebleeds. She has a scheduled followup appointment with her primary care physician to deal with these issues in the near future.   EXAM: Anterior neck shows a well-healed surgical incision with excellent cosmetic result. Palpation reveals no tenderness. There is no sinus seroma. There is no sign of infection. His quality is normal.   IMPRESSION: Status post minimally invasive parathyroidectomy with normalization of serum calcium levels   PLAN: Patient is released to full activity without restriction. She should have a serum calcium level obtained on a regular basis by her primary care physician. She will return to this office as needed.

## 2010-09-06 ENCOUNTER — Telehealth: Payer: Self-pay

## 2010-09-06 MED ORDER — LISINOPRIL 20 MG PO TABS: 20.0000 mg | ORAL_TABLET | Freq: Every day | ORAL | Status: DC

## 2010-09-06 NOTE — Telephone Encounter (Signed)
Pt called and stated her rx for BP is causing her to have terrible headaches, ankle swelling  And evere  leg cramps.  Pt states she went off her BP medicine for 4 days  And her symptoms went away but her BP went back up.  Pt states once she started the medicaiton again her symptoms came back.  Pt would like to know what she needs to do.    Per Dr. Scotty Court pt is to start Lisinopril 20 mg qd and leave off amlodipine for 2 days then start half a pill and make an appt for next week.    Pt is aware of change in medication. rx has been faxed to pharmacy.

## 2010-09-14 ENCOUNTER — Ambulatory Visit (INDEPENDENT_AMBULATORY_CARE_PROVIDER_SITE_OTHER): Payer: PRIVATE HEALTH INSURANCE | Admitting: Family Medicine

## 2010-09-14 ENCOUNTER — Encounter: Payer: Self-pay | Admitting: Family Medicine

## 2010-09-14 VITALS — BP 138/88 | HR 80 | Temp 98.1°F | Wt 128.0 lb

## 2010-09-14 DIAGNOSIS — I10 Essential (primary) hypertension: Secondary | ICD-10-CM

## 2010-09-14 DIAGNOSIS — T50905A Adverse effect of unspecified drugs, medicaments and biological substances, initial encounter: Secondary | ICD-10-CM

## 2010-09-14 DIAGNOSIS — R51 Headache: Secondary | ICD-10-CM

## 2010-09-14 DIAGNOSIS — M25512 Pain in left shoulder: Secondary | ICD-10-CM

## 2010-09-14 NOTE — Patient Instructions (Signed)
I am sorry you reacted to the Norvasc but we can observe  For a while  Checking at home or call and come in to the office to be checked by the nurse . In the meantime no medicine, if elevated to 150/ or more then start lisinopril 10 mg each day,call me when you start Get an appointment with Dr Butler Denmark

## 2010-10-25 ENCOUNTER — Ambulatory Visit (INDEPENDENT_AMBULATORY_CARE_PROVIDER_SITE_OTHER): Payer: PRIVATE HEALTH INSURANCE | Admitting: Family Medicine

## 2010-10-25 ENCOUNTER — Encounter: Payer: Self-pay | Admitting: Family Medicine

## 2010-10-25 VITALS — BP 130/88 | HR 65 | Temp 97.9°F | Wt 128.0 lb

## 2010-10-25 DIAGNOSIS — I10 Essential (primary) hypertension: Secondary | ICD-10-CM

## 2010-10-25 DIAGNOSIS — G8929 Other chronic pain: Secondary | ICD-10-CM

## 2010-10-25 DIAGNOSIS — F430 Acute stress reaction: Secondary | ICD-10-CM

## 2010-10-25 DIAGNOSIS — R51 Headache: Secondary | ICD-10-CM

## 2010-10-25 DIAGNOSIS — R0989 Other specified symptoms and signs involving the circulatory and respiratory systems: Secondary | ICD-10-CM

## 2010-10-25 MED ORDER — BUTALBITAL-APAP-CAFFEINE 50-325-40 MG PO TABS: ORAL_TABLET | ORAL | Status: AC

## 2010-10-25 MED ORDER — DIAZEPAM 5 MG PO TABS: ORAL_TABLET | ORAL | Status: DC

## 2010-10-25 MED ORDER — BUTALBITAL-APAP-CAFFEINE 50-325-40 MG PO TABS: ORAL_TABLET | ORAL | Status: DC

## 2010-10-25 NOTE — Patient Instructions (Signed)
You labile hypertension which is aggravated by your multiple areas of stress we have discussed the feel it would be with advantageous for you to take Valium 5 mg 3 times daily for stress or if you take alprazolam at bedtime leg off the Valium Start lisinopril 10 mg daily for hypertension Return in one month call is needed

## 2010-10-25 NOTE — Progress Notes (Signed)
  Subjective:    Patient ID: Cassandra Mcfarland, female    DOB: 1950-07-25, 60 y.o.   MRN: [id number] This 60 year old white married female is in today because her blood pressure is been fluctuating and was 170/102 today and the patient did have headache and blood pressure has been fluctuating from 1:30 to 170. The patient is under considerable stress with a new grandchild baby plus a very sick sister and dealing with a very stressful lawsuit. Patient also helps a daughter is in the cookie business Patient has been having headaches periodically and only has Demerol to take but rather have something not as strong. HPI    Review of Systems see history of present illness     Objective:   Physical Exam patient is a well-built well-nourished white female who appears to be in stress but no distress blood pressure x2 130/88 Heart normal sounds regular rhythm Lungs clear to palpation percussion auscultation        Assessment & Plan:  Labile hypertension to start lisinopril 10 mg so treat acute stress reaction resulting in headaches her with Valium 5 mg 3 times a day plus Fioricet as needed not over 4 for per day

## 2010-10-31 NOTE — Progress Notes (Signed)
  Subjective:    Patient ID: Cassandra Mcfarland, female    DOB: 08/27/1950, 60 y.o.   MRN: [id number] This 60 year old white married female who has had some anxiety but had rather severe symptoms from elevated blood pressure was seen in the emergency room and treated with amlodipine and milligram did respond to the treatment however since that time she has reacted to the medication is become shaky dizzy headachy Eric she'll also complains of pain under the left shoulder blade and has had previous shoulder problems subdeltoid bursitis and has been treated by Dr.Grammig in the his also had some legs hand cramping, no other symptoms on system review HPI    Review of Systems see history of his nose     Objective:   Physical Exam the patient is a well-built well-nourished white female contracted who appears to be having discomfort but no severe distress Pressure 138/88 Lungs clear palpation percussion auscultation no rales no dullness no wheezing heart examination revealed no abnormalitiexamination her some tenderness over the subdeltoid bursal area some discomfort on movement         Assessment & Plan:  Since the amlodipine caused you to be shaky dizzy and headache I recommend that she stop this and not take any medication and follow with regular blood pressure readings in fact return in one week to have blood pressure read by a nurse have give you a prescription for lisinopril 10 mg but did not start until we discuss it Subdeltoid bursitis left shoulder recommend seeing Dr. Butler Denmark Continue alprazolam for anxiety and tension

## 2010-11-22 ENCOUNTER — Other Ambulatory Visit: Payer: Self-pay | Admitting: Nurse Practitioner

## 2010-11-22 DIAGNOSIS — M8580 Other specified disorders of bone density and structure, unspecified site: Secondary | ICD-10-CM

## 2010-11-24 ENCOUNTER — Other Ambulatory Visit: Payer: Self-pay | Admitting: Obstetrics & Gynecology

## 2010-11-24 ENCOUNTER — Other Ambulatory Visit: Payer: Self-pay | Admitting: Nurse Practitioner

## 2010-11-24 DIAGNOSIS — Z1231 Encounter for screening mammogram for malignant neoplasm of breast: Secondary | ICD-10-CM

## 2010-12-01 ENCOUNTER — Ambulatory Visit
Admission: RE | Admit: 2010-12-01 | Discharge: 2010-12-01 | Disposition: A | Discharge status: Home / Self Care | Payer: PRIVATE HEALTH INSURANCE | Source: Ambulatory Visit | Attending: Nurse Practitioner | Admitting: Nurse Practitioner

## 2010-12-01 ENCOUNTER — Ambulatory Visit
Admission: RE | Admit: 2010-12-01 | Discharge: 2010-12-01 | Disposition: A | Discharge status: Home / Self Care | Payer: PRIVATE HEALTH INSURANCE | Source: Ambulatory Visit | Attending: Obstetrics & Gynecology | Admitting: Obstetrics & Gynecology

## 2010-12-01 DIAGNOSIS — Z1231 Encounter for screening mammogram for malignant neoplasm of breast: Secondary | ICD-10-CM

## 2010-12-01 DIAGNOSIS — M8580 Other specified disorders of bone density and structure, unspecified site: Secondary | ICD-10-CM

## 2010-12-05 ENCOUNTER — Other Ambulatory Visit: Payer: Self-pay | Admitting: Family Medicine

## 2010-12-06 ENCOUNTER — Other Ambulatory Visit: Payer: Self-pay | Admitting: Obstetrics & Gynecology

## 2010-12-06 DIAGNOSIS — R928 Other abnormal and inconclusive findings on diagnostic imaging of breast: Secondary | ICD-10-CM

## 2010-12-15 ENCOUNTER — Telehealth: Payer: Self-pay

## 2010-12-15 NOTE — Telephone Encounter (Signed)
Pt called and stated she went to Dr. Junie Bame and had a tear in her rotator cuff. Pt states she was put on Tramadol and is going to get an injection.  Pt stated she would like to know if it is okay to take tramadol with lisinopril.  Pt states her bp has been in the 118's and 112's. 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 Scotty Court.

## 2010-12-16 NOTE — Telephone Encounter (Signed)
Pt called and is req to get labs ordered for calcium,pth, cholesterol. Pt also is needing to speak with Dr Scotty Court asap re: MRI results. Pt needs labs drawn by 12/29/10.

## 2010-12-16 NOTE — Telephone Encounter (Signed)
Per Dr. Scotty Court ok for pt to take lisinopril and tramadol. Dr. Scotty Court request a report of MRI

## 2010-12-19 ENCOUNTER — Ambulatory Visit
Admission: RE | Admit: 2010-12-19 | Discharge: 2010-12-19 | Disposition: A | Discharge status: Home / Self Care | Payer: PRIVATE HEALTH INSURANCE | Source: Ambulatory Visit | Attending: Obstetrics & Gynecology | Admitting: Obstetrics & Gynecology

## 2010-12-19 DIAGNOSIS — R928 Other abnormal and inconclusive findings on diagnostic imaging of breast: Secondary | ICD-10-CM

## 2010-12-22 NOTE — Telephone Encounter (Signed)
Left a message for pt to return call 

## 2011-01-12 ENCOUNTER — Ambulatory Visit (INDEPENDENT_AMBULATORY_CARE_PROVIDER_SITE_OTHER): Payer: PRIVATE HEALTH INSURANCE | Admitting: Internal Medicine

## 2011-01-12 ENCOUNTER — Ambulatory Visit: Payer: PRIVATE HEALTH INSURANCE | Admitting: Family Medicine

## 2011-01-12 ENCOUNTER — Encounter: Payer: Self-pay | Admitting: Internal Medicine

## 2011-01-12 DIAGNOSIS — E039 Hypothyroidism, unspecified: Secondary | ICD-10-CM

## 2011-01-12 NOTE — Patient Instructions (Signed)
Limit your sodium (Salt) intake    It is important that you exercise regularly, at least 20 minutes 3 to 4 times per week.  If you develop chest pain or shortness of breath seek  medical attention.  Call or return to clinic prn if these symptoms worsen or fail to improve as anticipated.  

## 2011-01-12 NOTE — Progress Notes (Signed)
  Subjective:    Patient ID: Cassandra Mcfarland, female    DOB: [birth year], 60 y.o.   MRN: [id number]  HPI  60 year old patient who is seen today to discuss an abnormal MRI involving her left shoulder area. She has been followed by [facility] orthopedics and her left shoulder pain secondary to a partial rotator cuff tear has steadily improved. 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 This has been evaluated by orthopedics and they feel this lesion also is benign. She has a history of primary hyperparathyroidism and is status post parathyroidectomy. She also has treated hypothyroidism   Review of Systems  Constitutional: Negative.   HENT: Negative for hearing loss, congestion, sore throat, rhinorrhea, dental problem, sinus pressure and tinnitus.   Eyes: Negative for pain, discharge and visual disturbance.  Respiratory: Negative for cough and shortness of breath.   Cardiovascular: Negative for chest pain, palpitations and leg swelling.  Gastrointestinal: Negative for nausea, vomiting, abdominal pain, diarrhea, constipation, blood in stool and abdominal distention.  Genitourinary: Negative for dysuria, urgency, frequency, hematuria, flank pain, vaginal bleeding, vaginal discharge, difficulty urinating, vaginal pain and pelvic pain.  Musculoskeletal: Negative for joint swelling (left shoulder pain improving), arthralgias and gait problem.  Skin: Negative for rash.  Neurological: Negative for dizziness, syncope, speech difficulty, weakness, numbness and headaches.  Hematological: Negative for adenopathy.  Psychiatric/Behavioral: Negative for behavioral problems, dysphoric mood and agitation. The patient is not nervous/anxious.        Objective:   Physical Exam  Constitutional: She appears well-developed and well-nourished. No distress.          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 This was discussed and the patient reassured Status post parathyroidectomy. We'll check a calcium level and PTH Hypothyroidism. We'll check a followup TSH

## 2011-01-13 LAB — TSH: TSH: 1.53 u[IU]/mL (ref 0.35–5.50)

## 2011-01-18 ENCOUNTER — Telehealth: Payer: Self-pay | Admitting: Family Medicine

## 2011-01-18 NOTE — Telephone Encounter (Signed)
Pt requesting results of lab work.

## 2011-01-23 ENCOUNTER — Telehealth: Payer: Self-pay | Admitting: Family Medicine

## 2011-01-23 NOTE — Telephone Encounter (Signed)
Spoke with pt r/t labs - WNL

## 2011-01-23 NOTE — Telephone Encounter (Signed)
Pt called 11/21, but was still listed as a Scotty Court pt so the call may have been routed incorrectly. At any rate, pt had labs on 11/15 with Dr. Kirtland Bouchard and would like a call with results. Thanks!

## 2011-01-24 ENCOUNTER — Telehealth: Payer: Self-pay | Admitting: Internal Medicine

## 2011-01-24 NOTE — Telephone Encounter (Signed)
Faxed. Pt aware

## 2011-01-24 NOTE — Telephone Encounter (Signed)
Attempted to call pt.  Will try to call at a later time. Labs are stable.

## 2011-01-24 NOTE — Telephone Encounter (Signed)
Pt called and said that her endocrinologist, Dr Horald Pollen at [facility], is needing to get a copy of pts lab results faxed over to his office. The Fax # is [phone]

## 2011-02-03 ENCOUNTER — Encounter: Payer: Self-pay | Admitting: Internal Medicine

## 2011-05-10 DIAGNOSIS — H269 Unspecified cataract: Secondary | ICD-10-CM | POA: Insufficient documentation

## 2011-06-02 ENCOUNTER — Other Ambulatory Visit: Payer: Self-pay | Admitting: Family Medicine

## 2011-08-07 ENCOUNTER — Other Ambulatory Visit: Payer: Self-pay | Admitting: Family Medicine

## 2011-09-25 ENCOUNTER — Other Ambulatory Visit: Payer: Self-pay | Admitting: Family Medicine

## 2011-09-26 ENCOUNTER — Other Ambulatory Visit: Payer: Self-pay

## 2011-09-26 MED ORDER — LISINOPRIL 20 MG PO TABS: 20.0000 mg | ORAL_TABLET | Freq: Every day | ORAL | Status: DC

## 2011-09-26 NOTE — Telephone Encounter (Signed)
Needs to be seen to "establish care" - [place] transfer pt

## 2011-10-11 ENCOUNTER — Other Ambulatory Visit: Payer: Self-pay | Admitting: Family Medicine

## 2011-10-12 ENCOUNTER — Other Ambulatory Visit: Payer: Self-pay

## 2011-10-12 MED ORDER — DIAZEPAM 5 MG PO TABS: ORAL_TABLET | ORAL | Status: DC

## 2011-11-27 ENCOUNTER — Other Ambulatory Visit: Payer: Self-pay | Admitting: Internal Medicine

## 2011-12-14 ENCOUNTER — Telehealth: Payer: Self-pay

## 2011-12-14 ENCOUNTER — Telehealth: Payer: Self-pay | Admitting: Family Medicine

## 2011-12-14 DIAGNOSIS — E039 Hypothyroidism, unspecified: Secondary | ICD-10-CM

## 2011-12-14 NOTE — Telephone Encounter (Signed)
Per dr Amador Cunas - ok to do labs - fasting - order done - VM - LMTCB if questions - need lab only appt now and rov in 4 mos

## 2011-12-14 NOTE — Telephone Encounter (Signed)
Please call/notify patient that lab/test/procedure is normal;  suggest return office visit in one to 4 months

## 2011-12-14 NOTE — Telephone Encounter (Signed)
Pt calling. Last seen Nov. 2012. Would like repeat thyroid labs including calcium that she had at that time, as well lipids. States she does not necessarily want an OV, unless needed based on lab results. Does not want Vit D - just had it done at [facility]. Please advise.

## 2011-12-14 NOTE — Telephone Encounter (Signed)
Attempt to call pt- VM - LMTCB if questions- will send copy to home - no lipid was done.

## 2011-12-14 NOTE — Telephone Encounter (Signed)
Pt had Ca / PTH / TSH  And lipids - last seen 12/2010 ,no up coming appt - but will make appt if needed. These labs were done at last visit. Please advies

## 2011-12-19 ENCOUNTER — Other Ambulatory Visit (INDEPENDENT_AMBULATORY_CARE_PROVIDER_SITE_OTHER): Payer: PRIVATE HEALTH INSURANCE

## 2011-12-19 DIAGNOSIS — Z13220 Encounter for screening for lipoid disorders: Secondary | ICD-10-CM

## 2011-12-19 DIAGNOSIS — E039 Hypothyroidism, unspecified: Secondary | ICD-10-CM

## 2011-12-19 LAB — LIPID PANEL
Cholesterol: 289 mg/dL — ABNORMAL HIGH (ref 0–200)
HDL: 42.3 mg/dL (ref >39.00)
Total CHOL/HDL Ratio: 7
Triglycerides: 151 mg/dL — ABNORMAL HIGH (ref 0.0–149.0)

## 2011-12-20 LAB — PTH, INTACT AND CALCIUM: PTH: 26 pg/mL (ref 14.0–72.0)

## 2011-12-25 ENCOUNTER — Telehealth: Payer: Self-pay | Admitting: Internal Medicine

## 2011-12-25 NOTE — Telephone Encounter (Signed)
Forward to inform Copy sent to home

## 2011-12-25 NOTE — Progress Notes (Signed)
Quick Note:  Attempt to call patient lmtcb to discuss results and new med to start ______

## 2011-12-25 NOTE — Progress Notes (Signed)
Quick Note:  See phone note - pt aware ______

## 2011-12-25 NOTE — Telephone Encounter (Signed)
Please send copy of labs the patient. Suggest repeat fasting lipid profile in 6-8 weeks before considering treatment

## 2011-12-25 NOTE — Telephone Encounter (Signed)
Caller: [name]/Patient; Patient Name: Cassandra Mcfarland; PCP: Eleonore Chiquito [facility]); Best Callback Phone Number: [phone] States had blood work 10-22 and has not heard from office with results. Advised per EPIC, cholesterol was elevated/289. Advised Atorvastatin 40mg  1 daily #90 was sent to pharmacy. Patient thinks was mistake in blood work and states is going to think about it before starting med. States may schedule appointment to see doctor prior to starting mediicine. Advised other test results were normal.  Would like copy of lab results sent to her home. PLEASE SEND PATIENT A COPY OF LABS DONE 10-22.

## 2011-12-26 ENCOUNTER — Telehealth: Payer: Self-pay

## 2011-12-26 NOTE — Telephone Encounter (Signed)
Copy already sent to home adderess

## 2011-12-26 NOTE — Telephone Encounter (Signed)
Spoke with pt- ROV make to discuss lab and do a fasting lipid repeat.

## 2012-01-03 ENCOUNTER — Ambulatory Visit (INDEPENDENT_AMBULATORY_CARE_PROVIDER_SITE_OTHER): Payer: PRIVATE HEALTH INSURANCE | Admitting: Internal Medicine

## 2012-01-03 ENCOUNTER — Encounter: Payer: Self-pay | Admitting: Internal Medicine

## 2012-01-03 VITALS — BP 106/72 | Wt 130.0 lb

## 2012-01-03 DIAGNOSIS — Z23 Encounter for immunization: Secondary | ICD-10-CM

## 2012-01-03 DIAGNOSIS — I10 Essential (primary) hypertension: Secondary | ICD-10-CM

## 2012-01-03 DIAGNOSIS — E785 Hyperlipidemia, unspecified: Secondary | ICD-10-CM

## 2012-01-03 DIAGNOSIS — E039 Hypothyroidism, unspecified: Secondary | ICD-10-CM

## 2012-01-03 LAB — COMPREHENSIVE METABOLIC PANEL
AST: 24 U/L (ref 0–37)
BUN: 20 mg/dL (ref 6–23)
Calcium: 9.1 mg/dL (ref 8.4–10.5)
Chloride: 107 mEq/L (ref 96–112)
Creatinine, Ser: 1 mg/dL (ref 0.4–1.2)
GFR: 60.06 mL/min (ref >60.00)
Total Bilirubin: 0.7 mg/dL (ref 0.3–1.2)

## 2012-01-03 LAB — LIPID PANEL
HDL: 43 mg/dL (ref >39.00)
Triglycerides: 176 mg/dL — ABNORMAL HIGH (ref 0.0–149.0)
VLDL: 35.2 mg/dL (ref 0.0–40.0)

## 2012-01-03 NOTE — Patient Instructions (Signed)
Limit your sodium (Salt) intake    It is important that you exercise regularly, at least 20 minutes 3 to 4 times per week.  If you develop chest pain or shortness of breath seek  medical attention.  Please check your blood pressure on a regular basis.  If it is consistently greater than 150/90, please make an office appointment.  Cholesterol Cholesterol is a white, waxy, fat-like protein needed by your body in small amounts. The liver makes all the cholesterol you need. It is carried from the liver by the blood through the blood vessels. Deposits (plaque) may build up on blood vessel walls. This makes the arteries narrower and stiffer. Plaque increases the risk for heart attack and stroke. You cannot feel your cholesterol level even if it is very high. The only way to know is by a blood test to check your lipid (fats) levels. Once you know your cholesterol levels, you should keep a record of the test results. Work with your caregiver to to keep your levels in the desired range. WHAT THE RESULTS MEAN:  Total cholesterol is a rough measure of all the cholesterol in your blood.   LDL is the so-called bad cholesterol. This is the type that deposits cholesterol in the walls of the arteries. You want this level to be low.   HDL is the good cholesterol because it cleans the arteries and carries the LDL away. You want this level to be high.   Triglycerides are fat that the body can either burn for energy or store. High levels are closely linked to heart disease.  DESIRED LEVELS:  Total cholesterol below 200.   LDL below 100 for people at risk, below 70 for very high risk.   HDL above 50 is good, above 60 is best.   Triglycerides below 150.  HOW TO LOWER YOUR CHOLESTEROL:  Diet.   Choose fish or white meat chicken and [place], roasted or baked. Limit fatty cuts of red meat, fried foods, and processed meats, such as sausage and lunch meat.   Eat lots of fresh fruits and vegetables. Choose whole  grains, beans, pasta, potatoes and cereals.   Use only small amounts of olive, corn or canola oils. Avoid butter, mayonnaise, shortening or palm kernel oils. Avoid foods with trans-fats.   Use skim/nonfat milk and low-fat/nonfat yogurt and cheeses. Avoid whole milk, cream, ice cream, egg yolks and cheeses. Healthy desserts include angel food cake, ginger snaps, animal crackers, hard candy, popsicles, and low-fat/nonfat frozen yogurt. Avoid pastries, cakes, pies and cookies.   Exercise.   A regular program helps decrease LDL and raises HDL.   Helps with weight control.   Do things that increase your activity level like gardening, walking, or taking the stairs.   Medication.   May be prescribed by your caregiver to help lowering cholesterol and the risk for heart disease.   You may need medicine even if your levels are normal if you have several risk factors.  HOME CARE INSTRUCTIONS    Follow your diet and exercise programs as suggested by your caregiver.   Take medications as directed.   Have blood work done when your caregiver feels it is necessary.  MAKE SURE YOU:    Understand these instructions.   Will watch your condition.   Will get help right away if you are not doing well or get worse.  Document Released: 11/08/2000 Document Revised: 05/08/2011 Document Reviewed: 05/01/2007 [facility] Patient Information 2013 [place], [place].   Fat and Cholesterol  Control Diet Cholesterol levels in your body are determined significantly by your diet. Cholesterol levels may also be related to heart disease. The following material helps to explain this relationship and discusses what you can do to help keep your heart healthy. Not all cholesterol is bad. Low-density lipoprotein (LDL) cholesterol is the "bad" cholesterol. It may cause fatty deposits to build up inside your arteries. High-density lipoprotein (HDL) cholesterol is "good." It helps to remove the "bad" LDL cholesterol from your  blood. Cholesterol is a very important risk factor for heart disease. Other risk factors are high blood pressure, smoking, stress, heredity, and weight. The heart muscle gets its supply of blood through the coronary arteries. If your LDL cholesterol is high and your HDL cholesterol is low, you are at risk for having fatty deposits build up in your coronary arteries. This leaves less room through which blood can flow. Without sufficient blood and oxygen, the heart muscle cannot function properly and you may feel chest pains (angina pectoris). When a coronary artery closes up entirely, a part of the heart muscle may die causing a heart attack (myocardial infarction). CHECKING CHOLESTEROL When your caregiver sends your blood to a lab to be examined for cholesterol, a complete lipid (fat) profile may be done. With this test, the total amount of cholesterol and levels of LDL and HDL are determined. Triglycerides are a type of fat that circulates in the blood. They can also be used to determine heart disease risk. The list below describes what the numbers should be: Test: Total Cholesterol.  Less than 200 mg/dl.  Test: LDL "bad cholesterol."  Less than 100 mg/dl.   Less than 70 mg/dl if you are at very high risk of a heart attack or sudden cardiac death.  Test: HDL "good cholesterol."  Greater than 50 mg/dl for women.   Greater than 40 mg/dl for men.  Test: Triglycerides.  Less than 150 mg/dl.  CONTROLLING CHOLESTEROL WITH DIET Although exercise and lifestyle factors are important, your diet is key. That is because certain foods are known to raise cholesterol and others to lower it. The goal is to balance foods for their effect on cholesterol and more importantly, to replace saturated and trans fat with other types of fat, such as monounsaturated fat, polyunsaturated fat, and omega-3 fatty acids. On average, a person should consume no more than 15 to 17 g of saturated fat daily. Saturated and trans  fats are considered "bad" fats, and they will raise LDL cholesterol. Saturated fats are primarily found in animal products such as meats, butter, and cream. However, that does not mean you need to give up all your favorite foods. Today, there are good tasting, low-fat, low-cholesterol substitutes for most of the things you like to eat. Choose low-fat or nonfat alternatives. Choose round or loin cuts of red meat. These types of cuts are lowest in fat and cholesterol. Chicken (without the skin), fish, veal, and ground [place] breast are great choices. Eliminate fatty meats, such as hot dogs and salami. Even shellfish have little or no saturated fat. Have a 3 oz (85 g) portion when you eat lean meat, poultry, or fish. Trans fats are also called "partially hydrogenated oils." They are oils that have been scientifically manipulated so that they are solid at room temperature resulting in a longer shelf life and improved taste and texture of foods in which they are added. Trans fats are found in stick margarine, some tub margarines, cookies, crackers, and baked goods.  When baking and cooking, oils are a great substitute for butter. The monounsaturated oils are especially beneficial since it is believed they lower LDL and raise HDL. The oils you should avoid entirely are saturated tropical oils, such as coconut and palm.   Remember to eat a lot from food groups that are naturally free of saturated and trans fat, including fish, fruit, vegetables, beans, grains (barley, rice, couscous, bulgur wheat), and pasta (without cream sauces).   IDENTIFYING FOODS THAT LOWER CHOLESTEROL   Soluble fiber may lower your cholesterol. This type of fiber is found in fruits such as apples, vegetables such as broccoli, potatoes, and carrots, legumes such as beans, peas, and lentils, and grains such as barley. Foods fortified with plant sterols (phytosterol) may also lower cholesterol. You should eat at least 2 g per day of these foods  for a cholesterol lowering effect.   Read package labels to identify low-saturated fats, trans fat free, and low-fat foods at the supermarket. Select cheeses that have only 2 to 3 g saturated fat per ounce. Use a heart-healthy tub margarine that is free of trans fats or partially hydrogenated oil. When buying baked goods (cookies, crackers), avoid partially hydrogenated oils. Breads and muffins should be made from whole grains (whole-wheat or whole oat flour, instead of "flour" or "enriched flour"). Buy non-creamy canned soups with reduced salt and no added fats.   FOOD PREPARATION TECHNIQUES   Never deep-fry. If you must fry, either stir-fry, which uses very little fat, or use non-stick cooking sprays. When possible, broil, bake, or roast meats, and steam vegetables. Instead of putting butter or margarine on vegetables, use lemon and herbs, applesauce, and cinnamon (for squash and sweet potatoes), nonfat yogurt, salsa, and low-fat dressings for salads.   LOW-SATURATED FAT / LOW-FAT FOOD SUBSTITUTES Meats / Saturated Fat (g)  Avoid: Steak, marbled (3 oz/85 g) / 11 g   Choose: Steak, lean (3 oz/85 g) / 4 g   Avoid: Hamburger (3 oz/85 g) / 7 g   Choose: Hamburger, lean (3 oz/85 g) / 5 g   Avoid: Ham (3 oz/85 g) / 6 g   Choose: Ham, lean cut (3 oz/85 g) / 2.4 g   Avoid: Chicken, with skin, dark meat (3 oz/85 g) / 4 g   Choose: Chicken, skin removed, dark meat (3 oz/85 g) / 2 g   Avoid: Chicken, with skin, light meat (3 oz/85 g) / 2.5 g   Choose: Chicken, skin removed, light meat (3 oz/85 g) / 1 g  Dairy / Saturated Fat (g)  Avoid: Whole milk (1 cup) / 5 g   Choose: Low-fat milk, 2% (1 cup) / 3 g   Choose: Low-fat milk, 1% (1 cup) / 1.5 g   Choose: Skim milk (1 cup) / 0.3 g   Avoid: Hard cheese (1 oz/28 g) / 6 g   Choose: Skim milk cheese (1 oz/28 g) / 2 to 3 g   Avoid: Cottage cheese, 4% fat (1 cup) / 6.5 g   Choose: Low-fat cottage cheese, 1% fat (1 cup) / 1.5 g   Avoid: Ice  cream (1 cup) / 9 g   Choose: Sherbet (1 cup) / 2.5 g   Choose: Nonfat frozen yogurt (1 cup) / 0.3 g   Choose: Frozen fruit bar / trace   Avoid: Whipped cream (1 tbs) / 3.5 g   Choose: Nondairy whipped topping (1 tbs) / 1 g  Condiments / Saturated Fat (g)  Avoid: Mayonnaise (1  tbs) / 2 g   Choose: Low-fat mayonnaise (1 tbs) / 1 g   Avoid: Butter (1 tbs) / 7 g   Choose: Extra light margarine (1 tbs) / 1 g   Avoid: Coconut oil (1 tbs) / 11.8 g   Choose: Olive oil (1 tbs) / 1.8 g   Choose: Corn oil (1 tbs) / 1.7 g   Choose: Safflower oil (1 tbs) / 1.2 g   Choose: Sunflower oil (1 tbs) / 1.4 g   Choose: Soybean oil (1 tbs) / 2.4 g   Choose: Canola oil (1 tbs) / 1 g  Document Released: 02/13/2005 Document Revised: 05/08/2011 Document Reviewed: 08/04/2010 [facility] Patient Information 2013 [place], [place].   Hypercholesterolemia High Blood Cholesterol Cholesterol is a white, waxy, fat-like protein needed by your body in small amounts. The liver makes all the cholesterol you need. It is carried from the liver by the blood through the blood vessels. Deposits (plaque) may build up on blood vessel walls. This makes the arteries narrower and stiffer. Plaque increases the risk for heart attack and stroke. You cannot feel your cholesterol level even if it is very high. The only way to know is by a blood test to check your lipid (fats) levels. Once you know your cholesterol levels, you should keep a record of the test results. Work with your caregiver to to keep your levels in the desired range. WHAT THE RESULTS MEAN:  Total cholesterol is a rough measure of all the cholesterol in your blood.   LDL is the so-called bad cholesterol. This is the type that deposits cholesterol in the walls of the arteries. You want this level to be low.   HDL is the good cholesterol because it cleans the arteries and carries the LDL away. You want this level to be high.   Triglycerides are fat that the  body can either burn for energy or store. High levels are closely linked to heart disease.  DESIRED LEVELS:  Total cholesterol below 200.   LDL below 100 for people at risk, below 70 for very high risk.   HDL above 50 is good, above 60 is best.   Triglycerides below 150.  HOW TO LOWER YOUR CHOLESTEROL:  Diet.   Choose fish or white meat chicken and [place], roasted or baked. Limit fatty cuts of red meat, fried foods, and processed meats, such as sausage and lunch meat.   Eat lots of fresh fruits and vegetables. Choose whole grains, beans, pasta, potatoes and cereals.   Use only small amounts of olive, corn or canola oils. Avoid butter, mayonnaise, shortening or palm kernel oils. Avoid foods with trans-fats.   Use skim/nonfat milk and low-fat/nonfat yogurt and cheeses. Avoid whole milk, cream, ice cream, egg yolks and cheeses. Healthy desserts include angel food cake, gingersnaps, animal crackers, hard candy, popsicles, and low-fat/nonfat frozen yogurt. Avoid pastries, cakes, pies and cookies.   Exercise.   A regular program helps decrease LDL and raises HDL.   Helps with weight control.   Do things that increase your activity level like gardening, walking, or taking the stairs.   Medication.   May be prescribed by your caregiver to help lowering cholesterol and the risk for heart disease.   You may need medicine even if your levels are normal if you have several risk factors.  HOME CARE INSTRUCTIONS    Follow your diet and exercise programs as suggested by your caregiver.   Take medications as directed.   Have blood work  done when your caregiver feels it is necessary.  MAKE SURE YOU:    Understand these instructions.   Will watch your condition.   Will get help right away if you are not doing well or get worse.  Document Released: 02/13/2005 Document Revised: 05/08/2011 Document Reviewed: 08/01/2006 [facility] Patient Information 2013 [place], [place].

## 2012-01-03 NOTE — Progress Notes (Signed)
Subjective:    Patient ID: Cassandra Mcfarland, female    DOB: 1950/07/16, 61 y.o.   MRN: [id number]  HPI  61 year old patient who is seen today for followup. She has had some recent laboratory studies done that revealed significant dyslipidemia and a fairly dramatic change from lab studies one year prior she is here today to discuss statin therapy. Multiple questions were addressed. She does have a history of hypertension and a family history of premature heart disease.  Past Medical History  Diagnosis Date  . Osteoarthritis   . Thyroid disease     hyper parathyroidism    History   Social History  . Marital Status: Married    Spouse Name: N/A    Number of Children: N/A  . Years of Education: N/A   Occupational History  . Not on file.   Social History Main Topics  . Smoking status: Never Smoker   . Smokeless tobacco: Never Used  . Alcohol Use: 4.2 oz/week    7 Glasses of wine per week     Comment: occassionally  . Drug Use: No  . Sexually Active: Yes    Birth Control/ Protection: Post-menopausal   Other Topics Concern  . Not on file   Social History Narrative  . No narrative on file    Past Surgical History  Procedure Date  . Appendectomy   . Thumb fusion     Dr. Cyndee Brightly  . Toatal knee replacement 2001  . Breast redctuion 1998  . Abddominoplasty 1994  . Parathyroidectomy     Family History  Problem Relation Age of Onset  . Coronary artery disease Father   . Heart disease Father   . Coronary artery disease Brother   . Cancer Mother     Allergies  Allergen Reactions  . Ciprofloxacin   . Codeine   . Tetanus-Diphtheria Toxoids Td     Current Outpatient Prescriptions on File Prior to Visit  Medication Sig Dispense Refill  . diazepam (VALIUM) 5 MG tablet 1 morn midafternoon and hs for stress  90 tablet  2  . diclofenac (VOLTAREN) 75 MG EC tablet TAKE 1 TABLET BY MOUTH TWICE DAILY AFTER A MEAL  60 tablet  1  . estrogen-methylTESTOSTERone (ESTRATEST HS)  0.625-1.25 MG per tablet Take 1 tablet by mouth daily.        Marland Kitchen lisinopril (PRINIVIL,ZESTRIL) 20 MG tablet Take 1 tablet (20 mg total) by mouth daily.  90 tablet  0  . medroxyPROGESTERone (PROVERA) 10 MG tablet Take 10 mg by mouth daily.        . Multiple Vitamin (MULTIVITAMIN) capsule Take 1 capsule by mouth daily.        Marland Kitchen ALPRAZolam (XANAX) 0.5 MG tablet TAKE 1 TABLET BY MOUTH THREE TIMES DAILY AS NEEDED  90 tablet  1  . ergocalciferol (VITAMIN D2) 50000 UNITS capsule Take 50,000 Units by mouth once a week.         Current Facility-Administered Medications on File Prior to Visit  Medication Dose Route Frequency Provider Last Rate Last Dose  . methylPREDNISolone acetate (DEPO-MEDROL) injection 120 mg  120 mg Intra-articular Once Damian Leavell., MD        BP 106/72  Wt 130 lb (58.968 kg)       Review of Systems  Constitutional: Negative.   HENT: Negative for hearing loss, congestion, sore throat, rhinorrhea, dental problem, sinus pressure and tinnitus.   Eyes: Negative for pain, discharge and visual disturbance.  Respiratory: Negative for cough  and shortness of breath.   Cardiovascular: Negative for chest pain, palpitations and leg swelling.  Gastrointestinal: Negative for nausea, vomiting, abdominal pain, diarrhea, constipation, blood in stool and abdominal distention.  Genitourinary: Negative for dysuria, urgency, frequency, hematuria, flank pain, vaginal bleeding, vaginal discharge, difficulty urinating, vaginal pain and pelvic pain.  Musculoskeletal: Positive for arthralgias. Negative for joint swelling and gait problem.  Skin: Negative for rash.  Neurological: Negative for dizziness, syncope, speech difficulty, weakness, numbness and headaches.  Hematological: Negative for adenopathy.  Psychiatric/Behavioral: Negative for behavioral problems, dysphoric mood and agitation. The patient is not nervous/anxious.        Objective:   Physical Exam  Constitutional: She  is oriented to person, place, and time. She appears well-developed and well-nourished.  HENT:  Head: Normocephalic.  Right Ear: External ear normal.  Left Ear: External ear normal.  Mouth/Throat: Oropharynx is clear and moist.  Eyes: Conjunctivae normal and EOM are normal. Pupils are equal, round, and reactive to light.  Neck: Normal range of motion. Neck supple. No thyromegaly present.  Cardiovascular: Normal rate, regular rhythm, normal heart sounds and intact distal pulses.   Pulmonary/Chest: Effort normal and breath sounds normal.  Abdominal: Soft. Bowel sounds are normal. She exhibits no mass. There is no tenderness.  Musculoskeletal: Normal range of motion.  Lymphadenopathy:    She has no cervical adenopathy.  Neurological: She is alert and oriented to person, place, and time.  Skin: Skin is warm and dry. No rash noted.  Psychiatric: She has a normal mood and affect. Her behavior is normal.          Assessment & Plan:   Hypertension well controlled Rule out dyslipidemia. We'll recheck a lipid profile Hypothyroidism stable  Recheck 6 months

## 2012-01-04 ENCOUNTER — Other Ambulatory Visit: Payer: Self-pay | Admitting: Internal Medicine

## 2012-01-04 MED ORDER — ATORVASTATIN CALCIUM 20 MG PO TABS: 20.0000 mg | ORAL_TABLET | Freq: Every day | ORAL | Status: DC

## 2012-01-04 NOTE — Progress Notes (Signed)
Quick Note:  rx done ______ 

## 2012-01-19 ENCOUNTER — Other Ambulatory Visit: Payer: Self-pay | Admitting: *Deleted

## 2012-01-19 NOTE — Telephone Encounter (Signed)
Pharmacy request denied too soon for medication refill.

## 2012-02-05 ENCOUNTER — Ambulatory Visit: Payer: PRIVATE HEALTH INSURANCE | Admitting: Internal Medicine

## 2012-02-12 ENCOUNTER — Other Ambulatory Visit: Payer: Self-pay | Admitting: Internal Medicine

## 2012-03-21 ENCOUNTER — Other Ambulatory Visit: Payer: Self-pay | Admitting: Internal Medicine

## 2012-03-21 NOTE — Telephone Encounter (Signed)
Pt would like to switch to simvastatin 20mg  due to cost. Pt stated MD would like her to repeat her lipid feb 2014. Please put order in system.

## 2012-04-01 ENCOUNTER — Telehealth: Payer: Self-pay | Admitting: Internal Medicine

## 2012-04-01 NOTE — Telephone Encounter (Signed)
Okay to switch to simvastatin 20 mg dispense 100 tabs directions one by mouth each bedtime refills x3  Once sees started the new medication then set her up for a 2 month followup with Dr. Kirtland Bouchard.,,,,,,,, have her come in a week ahead of time for fasting lipid and liver panel

## 2012-04-01 NOTE — Telephone Encounter (Signed)
Pt would like to switch to simvastatin 20mg  due to cost. Pt stated MD would like her to repeat her lipid feb 2014. Please put order in system. Per patient she has been waiting for a response since 03/21/12 and now she is completely out of meds. Please assist and refer to last refill note. Please inform patient when done.

## 2012-04-05 MED ORDER — SIMVASTATIN 20 MG PO TABS: 20.0000 mg | ORAL_TABLET | Freq: Every day | ORAL | Status: DC

## 2012-04-23 ENCOUNTER — Other Ambulatory Visit: Payer: Self-pay | Admitting: Internal Medicine

## 2012-04-25 ENCOUNTER — Other Ambulatory Visit: Payer: Self-pay | Admitting: Internal Medicine

## 2012-05-13 ENCOUNTER — Other Ambulatory Visit: Payer: Self-pay | Admitting: Dermatology

## 2012-05-14 ENCOUNTER — Telehealth: Payer: Self-pay | Admitting: Gynecology

## 2012-05-14 NOTE — Telephone Encounter (Signed)
PT WOULD LIKE DR LATHROP TO CALL HER CONCERNING A MEDICATION CHANG/GW

## 2012-05-15 MED ORDER — ESTRADIOL-NORETHINDRONE ACET 0.5-0.1 MG PO TABS: 1.0000 | ORAL_TABLET | Freq: Every day | ORAL | Status: DC

## 2012-05-15 NOTE — Telephone Encounter (Signed)
PATIENT IS REQUESTING TO HAVE MEDICATION CHANGE DUE TO COST. ESTRATEST HS IS NOW #140.00 FOR 3 MONTH SUPPLY. CAN NOT AFFORD THIS EVEN THOUGH THIS HELPS HER WITH SYMPTOMS.  PLEASE ADVISE WHAT HER OPTIONS COULD BE. STATES MAYBE ESTROGEN ONLY WITH NO TESTOSTERONE? ALSO IS ON PROVERA10 MG. CB # [phone]

## 2012-05-15 NOTE — Telephone Encounter (Signed)
Ok to d/c estratest and change pt to estrogen only product such as estradiol 0.5mg  #90, rf 1 it will be generic, or if she wants can try an estrgen/progestin combination that is generic such as activella #3, rf1, and not need to take the prometrium. please document which she wants

## 2012-05-15 NOTE — Telephone Encounter (Signed)
Called patient , explained her option . Chose to complete medication she currently has Extratest HS and Provera and would like to try Activella 0.5mg /0.1mg  take one q day disp 30 with one refill per Dr. Farrel Gobble.

## 2012-05-15 NOTE — Telephone Encounter (Signed)
CHART IN YOUR OFFICE TO REFERR TO MED LIST. Cassandra Mcfarland

## 2012-05-27 ENCOUNTER — Other Ambulatory Visit: Payer: Self-pay | Admitting: Internal Medicine

## 2012-05-31 ENCOUNTER — Telehealth: Payer: Self-pay | Admitting: Gynecology

## 2012-05-31 NOTE — Telephone Encounter (Signed)
Pt would like to remain on estratest, no refill needed at this time.

## 2012-06-03 ENCOUNTER — Telehealth: Payer: Self-pay | Admitting: *Deleted

## 2012-07-02 ENCOUNTER — Other Ambulatory Visit: Payer: Self-pay | Admitting: Internal Medicine

## 2012-07-03 ENCOUNTER — Other Ambulatory Visit: Payer: Self-pay | Admitting: *Deleted

## 2012-07-03 MED ORDER — LISINOPRIL 20 MG PO TABS: ORAL_TABLET | ORAL | Status: DC

## 2012-07-12 ENCOUNTER — Other Ambulatory Visit: Payer: Self-pay | Admitting: Certified Nurse Midwife

## 2012-07-12 ENCOUNTER — Telehealth: Payer: Self-pay | Admitting: *Deleted

## 2012-07-12 MED ORDER — MEDROXYPROGESTERONE ACETATE 10 MG PO TABS: 10.0000 mg | ORAL_TABLET | Freq: Every day | ORAL | Status: DC

## 2012-07-12 NOTE — Telephone Encounter (Signed)
Please advise. aex was 11/24/11 & looks like rx was only given for 90 with 1 refill.

## 2012-07-12 NOTE — Telephone Encounter (Signed)
Per Dr. Farrel Gobble ok to refill until 9/14- Pt was here for AEX 11/24/11 #90/1 refill given for both provera and Estratest. #90/1 rf sent to [facility].

## 2012-07-12 NOTE — Telephone Encounter (Signed)
Routed to tl

## 2012-07-12 NOTE — Telephone Encounter (Signed)
Pharmacy needs clarification (prescription).

## 2012-07-12 NOTE — Telephone Encounter (Signed)
Pharmacy clarified pt is to take one by mouth days 1-7 of every month

## 2012-07-12 NOTE — Telephone Encounter (Signed)
Per Dr. Lathrop okay to refill until 9/14 

## 2012-07-12 NOTE — Telephone Encounter (Signed)
Per Dr. Farrel Gobble okay to refill until 9/14

## 2012-07-29 ENCOUNTER — Telehealth: Payer: Self-pay | Admitting: Internal Medicine

## 2012-07-29 DIAGNOSIS — E785 Hyperlipidemia, unspecified: Secondary | ICD-10-CM

## 2012-07-29 NOTE — Telephone Encounter (Signed)
Pt called, stated 7mo ago Dr Kirtland Bouchard put her on lipid med and told her to get a recheck on cholesterol in 7mos. I check ed the dictation - it does say to recheck in 7mos. However, I have no orders. Not sure if she needs lipid or lipid & liver. She is coming in for labs on Fri 6/6. Please advise. Thank you.

## 2012-07-29 NOTE — Telephone Encounter (Signed)
Pt needs to be seen and then he will order labs, recheck 6 months is a visit with him. Please schedule appt with Dr.K for when he returns and cancel lab appt.

## 2012-07-29 NOTE — Telephone Encounter (Signed)
I called and spoke w/patient. She states the labs were to know if the rx was working, and that if the labs are good and the rx is working, she does not see the sense in paying $200 for an OV. I stated I would have to pass this info to Dr Kirtland Bouchard and he would decide whether she needed labs, or OV and labs. I told her he was out this week and we'd let her know next week after he returns.  I cancelled the lab appt for 6/6 and did not sched an OV at this time.

## 2012-08-02 ENCOUNTER — Other Ambulatory Visit: Payer: PRIVATE HEALTH INSURANCE

## 2012-08-05 NOTE — Telephone Encounter (Signed)
Appt sched. Pt aware. Encounter closed.

## 2012-08-05 NOTE — Telephone Encounter (Signed)
Okay to order lipid profile only without ROV

## 2012-08-05 NOTE — Telephone Encounter (Signed)
Please see message and advise whether pt needs follow up appt or just lab work ordered?

## 2012-08-08 ENCOUNTER — Other Ambulatory Visit (INDEPENDENT_AMBULATORY_CARE_PROVIDER_SITE_OTHER): Payer: PRIVATE HEALTH INSURANCE

## 2012-08-08 DIAGNOSIS — E785 Hyperlipidemia, unspecified: Secondary | ICD-10-CM

## 2012-08-08 LAB — LIPID PANEL
Cholesterol: 136 mg/dL (ref 0–200)
HDL: 46.1 mg/dL (ref >39.00)
LDL Cholesterol: 74 mg/dL (ref 0–99)
VLDL: 15.6 mg/dL (ref 0.0–40.0)

## 2012-10-14 ENCOUNTER — Other Ambulatory Visit: Payer: Self-pay

## 2012-10-14 DIAGNOSIS — Z1231 Encounter for screening mammogram for malignant neoplasm of breast: Secondary | ICD-10-CM

## 2012-10-25 ENCOUNTER — Ambulatory Visit
Admission: RE | Admit: 2012-10-25 | Discharge: 2012-10-25 | Disposition: A | Discharge status: Home / Self Care | Payer: PRIVATE HEALTH INSURANCE | Source: Ambulatory Visit

## 2012-10-25 DIAGNOSIS — Z1231 Encounter for screening mammogram for malignant neoplasm of breast: Secondary | ICD-10-CM

## 2012-10-29 ENCOUNTER — Other Ambulatory Visit: Payer: Self-pay | Admitting: Internal Medicine

## 2012-11-20 ENCOUNTER — Encounter: Payer: Self-pay | Admitting: Gynecology

## 2012-11-27 ENCOUNTER — Ambulatory Visit (INDEPENDENT_AMBULATORY_CARE_PROVIDER_SITE_OTHER): Payer: PRIVATE HEALTH INSURANCE | Admitting: Gynecology

## 2012-11-27 ENCOUNTER — Encounter: Payer: Self-pay | Admitting: Gynecology

## 2012-11-27 VITALS — BP 100/68 | HR 66 | Resp 18 | Ht 65.5 in | Wt 133.0 lb

## 2012-11-27 DIAGNOSIS — N951 Menopausal and female climacteric states: Secondary | ICD-10-CM

## 2012-11-27 DIAGNOSIS — Z01419 Encounter for gynecological examination (general) (routine) without abnormal findings: Secondary | ICD-10-CM

## 2012-11-27 DIAGNOSIS — Z79890 Hormone replacement therapy (postmenopausal): Secondary | ICD-10-CM

## 2012-11-27 DIAGNOSIS — Z7189 Other specified counseling: Secondary | ICD-10-CM

## 2012-11-27 MED ORDER — EST ESTROGENS-METHYLTEST 0.625-1.25 MG PO TABS: 1.0000 | ORAL_TABLET | Freq: Every day | ORAL | Status: DC

## 2012-11-27 NOTE — Progress Notes (Signed)
62 y.o. Married Caucasian female   G2P2002 here for annual exam. Pt reports menses are regular.  She does not report hot flashes, does not have night sweats, does not have vaginal dryness.  She is using lubricants, .  She does report menopasual bleeding withdrawal.  Pt is tired of the bleeding but would like to continue with current treatment  Patient's last menstrual period was 11/05/2012.          Sexually active: yes  The current method of family planning is post menopausal status.    Exercising: yes  yoga, pilates 2x/wk ; weights 2x/wk Last pap: 11/24/11 NEG HR HPV Abnormal PAP: yes, 1992/1993 Mammogram: 10/29/12 BI RADS 1 BSE: yes Colonoscopy: 06/25/2006 DEXA: 10/12 Alcohol:  3 glasses of wine Tobacco: no  Hgb: PCP ; Urine: PCP  Health Maintenance  Topic Date Due  . Tetanus/tdap  10/03/1970  . Zostavax  10/03/2011  . Influenza Vaccine  09/27/2012  . Mammogram  10/26/2014  . Pap Smear  11/24/2014  . Colonoscopy  06/24/2016    Family History  Problem Relation Age of Onset  . Coronary artery disease Father   . Heart disease Father   . Heart attack Father   . Coronary artery disease Brother   . Hypertension Brother   . Cancer Mother   . Leukemia Mother 18  . Osteoporosis Mother   . Hypertension Sister   . Osteoporosis Sister   . Osteoporosis Maternal Aunt   . Hypertension Brother     Patient Active Problem List   Diagnosis Date Noted  . Dyslipidemia 01/03/2012  . Hypertension 01/03/2012  . HYPOTHYROIDISM 10/29/2007  . HYPERCALCEMIA 10/29/2007  . MENOPAUSAL SYNDROME 10/29/2007  . OSTEOARTHRITIS 10/29/2007    Past Medical History  Diagnosis Date  . Osteoarthritis   . Thyroid disease     hyper parathyroidism  . Anxiety   . Depression   . Situational depression   . Situational anxiety 09/2003    Past Surgical History  Procedure Laterality Date  . Thumb fusion      Dr. Cyndee Brightly  . Toatal knee replacement Right 2001  . Abddominoplasty  1995  .  Parathyroidectomy  5/1/112    due to hypercalcemia  . Tubal ligation  1994  . Breast reduction surgery  09/2001    & Lift  . Thumb fusion  08/2007    with bone graft- post trauma  . Appendectomy  Age 34    Allergies: Ciprofloxacin; Codeine; and Tetanus-diphtheria toxoids td  Current Outpatient Prescriptions  Medication Sig Dispense Refill  . ALPRAZolam (XANAX) 0.5 MG tablet TAKE 1 TABLET BY MOUTH THREE TIMES DAILY AS NEEDED  90 tablet  1  . diazepam (VALIUM) 5 MG tablet as needed. 1 morn midafternoon and hs for stress      . diclofenac (VOLTAREN) 75 MG EC tablet TAKE 1 TABLET BY MOUTH TWICE DAILY AFTER A MEAL  60 tablet  2  . EST ESTROGENS-METHYLTEST HS 0.625-1.25 MG per tablet TAKE 1 TABLET BY MOUTH EVERY DAY  90 tablet  1  . lisinopril (PRINIVIL,ZESTRIL) 20 MG tablet 10 mg. TAKE 1 TABLET BY MOUTH EVERY DAY      . medroxyPROGESTERone (PROVERA) 10 MG tablet Take 1 tablet (10 mg total) by mouth daily. Take one by mouth days 1-7 of the first of every month.  90 tablet  1  . Multiple Vitamin (MULTIVITAMIN) capsule Take 1 capsule by mouth daily.        . simvastatin (ZOCOR) 20 MG tablet  Take 1 tablet (20 mg total) by mouth at bedtime.  90 tablet  3  . ergocalciferol (VITAMIN D2) 50000 UNITS capsule Take 50,000 Units by mouth once a week.         Current Facility-Administered Medications  Medication Dose Route Frequency Provider Last Rate Last Dose  . methylPREDNISolone acetate (DEPO-MEDROL) injection 120 mg  120 mg Intra-articular Once Damian Leavell., MD        ROS: Pertinent items are noted in HPI.  Exam:    BP 100/68  Pulse 66  Resp 18  LMP 11/05/2012 Weight change: @WEIGHTCHANGE @ Last 3 height recordings:  Ht Readings from Last 3 Encounters:  No data found for Ht   General appearance: alert, cooperative and appears stated age Head: Normocephalic, without obvious abnormality, atraumatic Neck: no adenopathy, no carotid bruit, no JVD, supple, symmetrical, trachea  midline and thyroid not enlarged, symmetric, no tenderness/mass/nodules Lungs: clear to auscultation bilaterally Breasts: normal appearance, no masses or tenderness Heart: regular rate and rhythm, S1, S2 normal, no murmur, click, rub or gallop Abdomen: soft, non-tender; bowel sounds normal; no masses,  no organomegaly Extremities: extremities normal, atraumatic, no cyanosis or edema Skin: Skin color, texture, turgor normal. No rashes or lesions Lymph nodes: Cervical, supraclavicular, and axillary nodes normal. no inguinal nodes palpated Neurologic: Grossly normal   Pelvic: External genitalia:  no lesions              Urethra: normal appearing urethra with no masses, tenderness or lesions              Bartholins and Skenes: normal                 Vagina: normal appearing vagina with normal color and discharge, no lesions              Cervix: normal appearance              Pap taken: no        Bimanual Exam:  Uterus:  uterus is normal size, shape, consistency and nontender                                      Adnexa:    normal adnexa in size, nontender and no masses                                      Rectovaginal: Confirms                                      Anus:  normal sphincter tone, no lesions  A: well woman Hormone replacement     P: mammogram annually pap smear guidelines reviewed counseled on breast self exam, mammography screening, use and side effects of HRT, adequate intake of calcium and vitamin D, diet and exercise Reviewed findings of [facility] regarding increased breast cancer in progestin arm, pt would like to continue-refill given return annually or prn Discussed PAP guideline changes, importance of weight bearing exercises, calcium, vit D and balanced diet.  An After Visit Summary was printed and given to the patient.

## 2012-11-27 NOTE — Patient Instructions (Signed)

## 2013-01-02 ENCOUNTER — Other Ambulatory Visit: Payer: Self-pay

## 2013-02-03 ENCOUNTER — Other Ambulatory Visit: Payer: Self-pay | Admitting: Internal Medicine

## 2013-02-25 ENCOUNTER — Encounter: Payer: Self-pay | Admitting: Internal Medicine

## 2013-02-25 ENCOUNTER — Ambulatory Visit (INDEPENDENT_AMBULATORY_CARE_PROVIDER_SITE_OTHER): Payer: PRIVATE HEALTH INSURANCE | Admitting: Internal Medicine

## 2013-02-25 VITALS — BP 120/72 | HR 66 | Temp 97.4°F | Resp 18 | Wt 132.0 lb

## 2013-02-25 DIAGNOSIS — E039 Hypothyroidism, unspecified: Secondary | ICD-10-CM

## 2013-02-25 DIAGNOSIS — M1990 Unspecified osteoarthritis, unspecified site: Secondary | ICD-10-CM

## 2013-02-25 DIAGNOSIS — E785 Hyperlipidemia, unspecified: Secondary | ICD-10-CM

## 2013-02-25 DIAGNOSIS — I10 Essential (primary) hypertension: Secondary | ICD-10-CM

## 2013-02-25 LAB — CBC WITH DIFFERENTIAL/PLATELET
Basophils Relative: 0.7 % (ref 0.0–3.0)
Eosinophils Absolute: 0.1 10*3/uL (ref 0.0–0.7)
Eosinophils Relative: 1.2 % (ref 0.0–5.0)
HCT: 38.7 % (ref 36.0–46.0)
Hemoglobin: 13.1 g/dL (ref 12.0–15.0)
MCHC: 33.9 g/dL (ref 30.0–36.0)
MCV: 91.6 fl (ref 78.0–100.0)
Monocytes Absolute: 0.5 10*3/uL (ref 0.1–1.0)
Neutro Abs: 2.8 10*3/uL (ref 1.4–7.7)
Neutrophils Relative %: 53.4 % (ref 43.0–77.0)
RBC: 4.22 Mil/uL (ref 3.87–5.11)
WBC: 5.2 10*3/uL (ref 4.5–10.5)

## 2013-02-25 LAB — COMPREHENSIVE METABOLIC PANEL
ALT: 20 U/L (ref 0–35)
AST: 23 U/L (ref 0–37)
Alkaline Phosphatase: 36 U/L — ABNORMAL LOW (ref 39–117)
BUN: 15 mg/dL (ref 6–23)
Chloride: 107 mEq/L (ref 96–112)
Creatinine, Ser: 1 mg/dL (ref 0.4–1.2)
Glucose, Bld: 78 mg/dL (ref 70–99)
Total Bilirubin: 0.8 mg/dL (ref 0.3–1.2)

## 2013-02-25 LAB — TSH: TSH: 1.48 u[IU]/mL (ref 0.35–5.50)

## 2013-02-25 MED ORDER — ALPRAZOLAM 0.5 MG PO TABS: ORAL_TABLET | ORAL | Status: DC

## 2013-02-25 MED ORDER — SIMVASTATIN 20 MG PO TABS: 20.0000 mg | ORAL_TABLET | Freq: Every day | ORAL | Status: DC

## 2013-02-25 MED ORDER — DIAZEPAM 5 MG PO TABS: ORAL_TABLET | ORAL | Status: DC

## 2013-02-25 MED ORDER — LISINOPRIL 20 MG PO TABS: 20.0000 mg | ORAL_TABLET | Freq: Every day | ORAL | Status: DC

## 2013-02-25 MED ORDER — DICLOFENAC SODIUM 75 MG PO TBEC: DELAYED_RELEASE_TABLET | ORAL | Status: DC

## 2013-02-25 NOTE — Progress Notes (Signed)
Pre-visit discussion using our clinic review tool. No additional management support is needed unless otherwise documented below in the visit note.  

## 2013-02-25 NOTE — Patient Instructions (Signed)
Limit your sodium (Salt) intake    It is important that you exercise regularly, at least 20 minutes 3 to 4 times per week.  If you develop chest pain or shortness of breath seek  medical attention.  Please check your blood pressure on a regular basis.  If it is consistently greater than 150/90, please make an office appointment.  

## 2013-02-25 NOTE — Progress Notes (Signed)
Subjective:    Patient ID: Cassandra Mcfarland, female    DOB: [birth year], 62 y.o.   MRN: [id number]  HPI  62 year old patient who is seen today for followup. She has a history of primary hyperparathyroidism and is status post surgery about 5 years ago. She has mild hypertension dyslipidemia and osteoarthritis. She does have a history of hypothyroidism but apparently has never been treated. She has had a recent gynecologic examination. No new concerns or complaints  Past Medical History  Diagnosis Date  . Osteoarthritis   . Thyroid disease     hyper parathyroidism  . Anxiety   . Depression   . Situational depression   . Situational anxiety 09/2003    History   Social History  . Marital Status: Married    Spouse Name: N/A    Number of Children: N/A  . Years of Education: N/A   Occupational History  . Not on file.   Social History Main Topics  . Smoking status: Never Smoker   . Smokeless tobacco: Never Used  . Alcohol Use: 1.8 oz/week    3 Glasses of wine per week     Comment: occassionally  . Drug Use: No  . Sexual Activity: Yes    Partners: Male    Birth Control/ Protection: Post-menopausal   Other Topics Concern  . Not on file   Social History Narrative  . No narrative on file    Past Surgical History  Procedure Laterality Date  . Thumb fusion      Dr. Cyndee Brightly  . Toatal knee replacement Right 2001  . Abddominoplasty  1995  . Parathyroidectomy  5/1/112    due to hypercalcemia  . Tubal ligation  1994  . Breast reduction surgery  09/2001    & Lift  . Thumb fusion  08/2007    with bone graft- post trauma  . Appendectomy  Age 62    Family History  Problem Relation Age of Onset  . Coronary artery disease Father   . Heart disease Father   . Heart attack Father   . Coronary artery disease Brother   . Hypertension Brother   . Cancer Mother   . Leukemia Mother 61  . Osteoporosis Mother   . Hypertension Sister   . Osteoporosis Sister   . Osteoporosis  Maternal Aunt   . Hypertension Brother     Allergies  Allergen Reactions  . Ciprofloxacin Other (See Comments)    Severe burning, blisters on toes  . Codeine Nausea And Vomiting  . Tetanus-Diphtheria Toxoids Td Swelling    Current Outpatient Prescriptions on File Prior to Visit  Medication Sig Dispense Refill  . ALPRAZolam (XANAX) 0.5 MG tablet TAKE 1 TABLET BY MOUTH THREE TIMES DAILY AS NEEDED  90 tablet  1  . diazepam (VALIUM) 5 MG tablet TAKE 1 TABLET BY MOUTH EVERY MORNING, MIDAFTERNOON AND AT BEDTIME FOR STRESS  90 tablet  0  . diclofenac (VOLTAREN) 75 MG EC tablet TAKE 1 TABLET BY MOUTH TWICE DAILY AFTER A MEAL  60 tablet  2  . estrogen-methylTESTOSTERone (EST ESTROGENS-METHYLTEST HS) 0.625-1.25 MG per tablet Take 1 tablet by mouth daily.  90 tablet  3  . lisinopril (PRINIVIL,ZESTRIL) 20 MG tablet 10 mg. TAKE 1 TABLET BY MOUTH EVERY DAY      . medroxyPROGESTERone (PROVERA) 10 MG tablet Take 1 tablet (10 mg total) by mouth daily. Take one by mouth days 1-7 of the first of every month.  90 tablet  1  .  Multiple Vitamin (MULTIVITAMIN) capsule Take 1 capsule by mouth daily.        . simvastatin (ZOCOR) 20 MG tablet Take 1 tablet (20 mg total) by mouth at bedtime.  90 tablet  3   No current facility-administered medications on file prior to visit.    BP 120/72  Pulse 66  Temp(Src) 97.4 F (36.3 C) (Oral)  Resp 18  Wt 132 lb (59.875 kg)  SpO2 97%  LMP 02/04/2013      Review of Systems  Constitutional: Negative.   HENT: Negative for congestion, dental problem, hearing loss, rhinorrhea, sinus pressure, sore throat and tinnitus.   Eyes: Negative for pain, discharge and visual disturbance.  Respiratory: Negative for cough and shortness of breath.   Cardiovascular: Negative for chest pain, palpitations and leg swelling.  Gastrointestinal: Negative for nausea, vomiting, abdominal pain, diarrhea, constipation, blood in stool and abdominal distention.  Genitourinary: Negative  for dysuria, urgency, frequency, hematuria, flank pain, vaginal bleeding, vaginal discharge, difficulty urinating, vaginal pain and pelvic pain.  Musculoskeletal: Negative for arthralgias, gait problem and joint swelling.  Skin: Negative for rash.  Neurological: Negative for dizziness, syncope, speech difficulty, weakness, numbness and headaches.  Hematological: Negative for adenopathy.  Psychiatric/Behavioral: Negative for behavioral problems, dysphoric mood and agitation. The patient is not nervous/anxious.        Objective:   Physical Exam  Constitutional: She is oriented to person, place, and time. She appears well-developed and well-nourished.  HENT:  Head: Normocephalic.  Right Ear: External ear normal.  Left Ear: External ear normal.  Mouth/Throat: Oropharynx is clear and moist.  Eyes: Conjunctivae and EOM are normal. Pupils are equal, round, and reactive to light.  Neck: Normal range of motion. Neck supple. No thyromegaly present.  Cardiovascular: Normal rate, regular rhythm, normal heart sounds and intact distal pulses.   Pulmonary/Chest: Effort normal and breath sounds normal.  Abdominal: Soft. Bowel sounds are normal. She exhibits no mass. There is no tenderness.  Musculoskeletal: Normal range of motion.  Lymphadenopathy:    She has no cervical adenopathy.  Neurological: She is alert and oriented to person, place, and time.  Skin: Skin is warm and dry. No rash noted.  Psychiatric: She has a normal mood and affect. Her behavior is normal.          Assessment & Plan:   Hypertension well controlled Dyslipidemia. Continue simvastatin and start her disorder. Medications refilled osteoarthritis   Recheck one year or as needed

## 2013-03-10 ENCOUNTER — Telehealth: Payer: Self-pay | Admitting: Internal Medicine

## 2013-03-10 NOTE — Telephone Encounter (Signed)
Pt was viewing her blood work on [employer] and would like nurse to explain the results

## 2013-03-12 NOTE — Telephone Encounter (Signed)
Spoke to pt, had question about kidney test and other labs. Told pt labs normal except GFR is low filtering of kidneys and that will be monitored, and need to keep blood pressure under control. Pt verbalized understanding.

## 2013-03-17 ENCOUNTER — Telehealth: Payer: Self-pay | Admitting: Internal Medicine

## 2013-03-17 NOTE — Telephone Encounter (Signed)
Called pt back, pt had questions about drug screen urine testing done, was upset due to not aware being done and saw on her bill. Explained to pt the new procedure and apologized to her for her not being informed. Pt verbalized understanding.

## 2013-03-17 NOTE — Telephone Encounter (Signed)
Pt would like to know her ua results and is upset that no one told her about the [facility] process/cost, etc.  Patient would like a call back in regards to her results.   Please advise. I gave patient the [facility] billing number.

## 2013-04-02 ENCOUNTER — Encounter: Payer: Self-pay | Admitting: Internal Medicine

## 2013-09-15 ENCOUNTER — Other Ambulatory Visit: Payer: Self-pay | Admitting: Gynecology

## 2013-09-15 DIAGNOSIS — N951 Menopausal and female climacteric states: Secondary | ICD-10-CM

## 2013-09-15 MED ORDER — EST ESTROGENS-METHYLTEST 0.625-1.25 MG PO TABS: 1.0000 | ORAL_TABLET | Freq: Every day | ORAL | Status: DC

## 2013-09-15 NOTE — Addendum Note (Signed)
Addended by: Gerda Diss on: 09/15/2013 02:21 PM   Modules accepted: Orders

## 2013-09-15 NOTE — Telephone Encounter (Addendum)
Sent a 3 mths supply to last until her AEX. No further refills until then Encounter closed  Opened and changed  in error.[place]

## 2013-09-15 NOTE — Telephone Encounter (Addendum)
Last AEX: 11/27/12 Last refill: 11/27/12 #90, 3 ref Current AEX: 11/28/13 Last MMG: 10/29/12 BI-RADS Neg  Pt was given a 90 day supply with 3 refills. She should have enough to cover until her AEX in Oct.  Encounter closed

## 2013-09-15 NOTE — Telephone Encounter (Signed)
Patient was in for aex in October 2014 she still had about 1.5 month left on her rx at that time so she couldn't get the first refill in October. This continued until April when the rx expired with refills so she was unable to get it before it expired to get it to last until her appt in October. She never got all of the refills on the rx. She is short 1 because the pharmacy wouldn't refill it in April because she still had some medication and they said it was to early. She currently only has enough to last her a week. And needs the 1 refill for 3 mth supply to get her until her aex.

## 2013-09-18 ENCOUNTER — Other Ambulatory Visit: Payer: Self-pay

## 2013-09-18 DIAGNOSIS — N951 Menopausal and female climacteric states: Secondary | ICD-10-CM

## 2013-09-18 MED ORDER — EST ESTROGENS-METHYLTEST 0.625-1.25 MG PO TABS: 1.0000 | ORAL_TABLET | Freq: Every day | ORAL | Status: DC

## 2013-09-18 NOTE — Telephone Encounter (Signed)
Last AEX: 11/27/12  Last refill: 11/27/12 #90, 3 ref  Current AEX: 11/28/13  Last MMG: 10/29/12 BI-RADS Neg  Pt called stating her rx refills were expired after 6 mths. Called pharmacy and they verified that. Pt was given a year of refills on 11/27/12. Pt now needs a 90 supply to last until next AEX. Made new encounter from encounter 09/15/13. I missed the rx being controlled and forgot to print the rx and have Dr. Charlies Constable sign.   Please approve or deny

## 2013-09-18 NOTE — Addendum Note (Signed)
Addended by: Gerda Diss on: 09/18/2013 09:28 AM   Modules accepted: Orders

## 2013-09-18 NOTE — Telephone Encounter (Signed)
Pt refills expired after 6 mth per pharmacy. Pt needs a 90 day supply to last until AEX. The rx was never faxed and pharmacy never received a rx.   Can you approve or deny.

## 2013-09-18 NOTE — Telephone Encounter (Signed)
rx faxed to [facility], pt informed Encounter closed

## 2013-10-22 ENCOUNTER — Encounter: Payer: Self-pay | Admitting: Obstetrics and Gynecology

## 2013-11-26 ENCOUNTER — Other Ambulatory Visit: Payer: Self-pay | Admitting: Internal Medicine

## 2013-11-27 NOTE — Telephone Encounter (Signed)
ok 

## 2013-11-28 ENCOUNTER — Ambulatory Visit: Payer: PRIVATE HEALTH INSURANCE | Admitting: Obstetrics and Gynecology

## 2013-11-28 ENCOUNTER — Ambulatory Visit (INDEPENDENT_AMBULATORY_CARE_PROVIDER_SITE_OTHER): Payer: No Typology Code available for payment source | Admitting: Gynecology

## 2013-11-28 ENCOUNTER — Encounter: Payer: Self-pay | Admitting: Gynecology

## 2013-11-28 ENCOUNTER — Ambulatory Visit: Payer: No Typology Code available for payment source | Admitting: Obstetrics and Gynecology

## 2013-11-28 VITALS — BP 114/64 | HR 70 | Resp 12 | Ht 65.5 in | Wt 134.0 lb

## 2013-11-28 DIAGNOSIS — Z79890 Hormone replacement therapy (postmenopausal): Secondary | ICD-10-CM

## 2013-11-28 DIAGNOSIS — Z7189 Other specified counseling: Secondary | ICD-10-CM

## 2013-11-28 DIAGNOSIS — Z01419 Encounter for gynecological examination (general) (routine) without abnormal findings: Secondary | ICD-10-CM

## 2013-11-28 DIAGNOSIS — N951 Menopausal and female climacteric states: Secondary | ICD-10-CM

## 2013-11-28 MED ORDER — EST ESTROGENS-METHYLTEST 0.625-1.25 MG PO TABS: 1.0000 | ORAL_TABLET | Freq: Every day | ORAL | Status: DC

## 2013-11-28 MED ORDER — MEDROXYPROGESTERONE ACETATE 10 MG PO TABS: 10.0000 mg | ORAL_TABLET | Freq: Every day | ORAL | Status: DC

## 2013-11-28 NOTE — Progress Notes (Signed)
63 y.o. Married Caucasian female   G2P2002 here for annual exam. Pt reports menses are regular.  She does not report hot flashes, does not have night sweats, does not have vaginal dryness.  She is not using lubricants..  She does report post-menopasual bleeding. Pt has a routine withdraw bleed on HRT-5d-pt tried to decrease her estro-test but after 57m she noticed depression,  No hot flashes, resumed prior dose and sx resolved after 1w.  Pt is aware of risks of HRT but feels at this time she cannot stop it.  No history of depression.  Patient's last menstrual period was 02/04/2013.          Sexually active: Yes.    The current method of family planning is tubal ligation and post menopausal status.    Exercising: Yes.    weights Last pap: 11/24/11 NEG HR HPV not indicated  Abnormal PAP:  Yes 25 years ago  Mammogram: 10/29/12  BSE: yes  Colonoscopy: 2009 - Normal f/u 10 years DEXA: 12/01/10 Alcohol: no Tobacco: no  Labs: Bluford Kaufmann, MD  Health Maintenance  Topic Date Due  . Tetanus/tdap  10/03/1970  . Zostavax  10/03/2011  . Influenza Vaccine  09/27/2013  . Mammogram  10/26/2014  . Pap Smear  11/28/2015  . Colonoscopy  06/24/2016    Family History  Problem Relation Age of Onset  . Coronary artery disease Father   . Heart disease Father   . Heart attack Father   . Coronary artery disease Brother   . Hypertension Brother   . Cancer Mother   . Leukemia Mother 29  . Osteoporosis Mother   . Hypertension Sister   . Osteoporosis Sister   . Osteoporosis Maternal Aunt   . Hypertension Brother     Patient Active Problem List   Diagnosis Date Noted  . Dyslipidemia 01/03/2012  . Hypertension 01/03/2012  . HYPOTHYROIDISM 10/29/2007  . HYPERCALCEMIA 10/29/2007  . MENOPAUSAL SYNDROME 10/29/2007  . OSTEOARTHRITIS 10/29/2007    Past Medical History  Diagnosis Date  . Osteoarthritis   . Thyroid disease     hyper parathyroidism  . Anxiety   . Depression   . Situational  depression   . Situational anxiety 09/2003    Past Surgical History  Procedure Laterality Date  . Thumb fusion      Dr. Loney Loh  . Toatal knee replacement Right 2001  . Abddominoplasty  1995  . Parathyroidectomy  5/1/112    due to hypercalcemia  . Tubal ligation  1994  . Breast reduction surgery  09/2001    & Lift  . Thumb fusion  08/2007    with bone graft- post trauma  . Appendectomy  Age 69    Allergies: Ciprofloxacin; Codeine; and Tetanus-diphtheria toxoids td  Current Outpatient Prescriptions  Medication Sig Dispense Refill  . ALPRAZolam (XANAX) 0.5 MG tablet TAKE 1 TABLET BY MOUTH THREE TIMES DAILY AS NEEDED.  90 tablet  0  . diazepam (VALIUM) 5 MG tablet TAKE 1 TABLET BY MOUTH EVERY MORNING, MIDAFTERNOON AND AT BEDTIME FOR STRESS  90 tablet  0  . diclofenac (VOLTAREN) 75 MG EC tablet TAKE 1 TABLET BY MOUTH TWICE DAILY AFTER A MEAL  180 tablet  4  . estrogen-methylTESTOSTERone (EST ESTROGENS-METHYLTEST HS) 0.625-1.25 MG per tablet Take 1 tablet by mouth daily.  90 tablet  0  . lisinopril (PRINIVIL,ZESTRIL) 20 MG tablet Take 1 tablet (20 mg total) by mouth daily. TAKE 1 TABLET BY MOUTH EVERY DAY  90 tablet  4  .  medroxyPROGESTERone (PROVERA) 10 MG tablet Take 1 tablet (10 mg total) by mouth daily. Take one by mouth days 1-7 of the first of every month.  90 tablet  1  . Multiple Vitamin (MULTIVITAMIN) capsule Take 1 capsule by mouth daily.        . simvastatin (ZOCOR) 20 MG tablet Take 1 tablet (20 mg total) by mouth at bedtime.  90 tablet  3   No current facility-administered medications for this visit.    ROS: Pertinent items are noted in HPI.  Exam:    Ht 5' 5.5" (1.664 m)  Wt 134 lb (60.782 kg)  BMI 21.95 kg/m2  LMP 02/04/2013 Weight change: @WEIGHTCHANGE @ Last 3 height recordings:  Ht Readings from Last 3 Encounters:  11/28/13 5' 5.5" (1.664 m)  11/27/12 5' 5.5" (1.664 m)   General appearance: alert, cooperative and appears stated age Head: Normocephalic,  without obvious abnormality, atraumatic Neck: no adenopathy, no carotid bruit, no JVD, supple, symmetrical, trachea midline and thyroid not enlarged, symmetric, no tenderness/mass/nodules Lungs: clear to auscultation bilaterally Breasts: Inspection negative, No nipple retraction or dimpling, No nipple discharge or bleeding, No axillary or supraclavicular adenopathy, Normal to palpation without dominant masses Heart: regular rate and rhythm, S1, S2 normal, no murmur, click, rub or gallop Abdomen: soft, non-tender; bowel sounds normal; no masses,  no organomegaly Extremities: extremities normal, atraumatic, no cyanosis or edema Skin: Skin color, texture, turgor normal. No rashes or lesions Lymph nodes: Cervical, supraclavicular, and axillary nodes normal. no inguinal nodes palpated Neurologic: Grossly normal   Pelvic: External genitalia:  no lesions              Urethra: normal appearing urethra with no masses, tenderness or lesions              Bartholins and Skenes: Bartholin's, Urethra, Skene's normal                 Vagina: cystocele and rectocele              Cervix: normal appearance              Pap taken: No.        Bimanual Exam:  Uterus:  uterus is normal size, shape, consistency and nontender                                      Adnexa:    normal adnexa in size, nontender and no masses                                      Rectovaginal: Confirms                                      Anus:  normal sphincter tone, no lesions       1. Encounter for routine gynecological examination  mammogram counseled on breast self exam, mammography screening, osteoporosis-forearm DEXA  -2.1, done for removal of parathyroid, recommend repeat as was baseline, defers due to no insurance to cover,  adequate intake of calcium and vitamin D, diet and exercise return annually or prn Discussed PAP guideline changes, importance of weight bearing exercises, calcium, vit D and balanced diet.   2.  Menopausal symptom  - estrogen-methylTESTOSTERone (EST ESTROGENS-METHYLTEST  HS) 0.625-1.25 MG per tablet; Take 1 tablet by mouth daily.  Dispense: 90 tablet; Refill: 1 - medroxyPROGESTERone (PROVERA) 10 MG tablet; Take 1 tablet (10 mg total) by mouth daily. Take one by mouth days 1-7 of the first of every month.  Dispense: 90 tablet; Refill: 1  3. Counseling for hormone replacement therapy reviewed findings of [facility] and nurses study regarding risks of breast cancer, CVD, CVA-pt understands risks, also understand there are non-hormonal alternatives, defers change in therapy  An After Visit Summary was printed and given to the patient.

## 2013-12-29 ENCOUNTER — Encounter: Payer: Self-pay | Admitting: Gynecology

## 2014-02-02 ENCOUNTER — Telehealth: Payer: Self-pay | Admitting: Internal Medicine

## 2014-02-02 NOTE — Telephone Encounter (Signed)
Left message on voicemail to call office. Pt needs to schedule physical, last seen 01/2013.

## 2014-02-03 NOTE — Telephone Encounter (Signed)
Pt notified Rx called into pharmacy enough till appointment.

## 2014-02-03 NOTE — Telephone Encounter (Signed)
Patient called back and cpx is scheduled for 03/10/14 and labs 03/05/14.  She would like to know if you can fill her medication until she is seen??

## 2014-03-05 ENCOUNTER — Other Ambulatory Visit: Payer: PRIVATE HEALTH INSURANCE

## 2014-03-10 ENCOUNTER — Ambulatory Visit (INDEPENDENT_AMBULATORY_CARE_PROVIDER_SITE_OTHER): Payer: No Typology Code available for payment source | Admitting: Internal Medicine

## 2014-03-10 ENCOUNTER — Encounter: Payer: Self-pay | Admitting: Internal Medicine

## 2014-03-10 VITALS — BP 118/80 | HR 60 | Temp 97.4°F | Resp 18 | Ht 64.75 in | Wt 133.0 lb

## 2014-03-10 DIAGNOSIS — I10 Essential (primary) hypertension: Secondary | ICD-10-CM

## 2014-03-10 DIAGNOSIS — Z0000 Encounter for general adult medical examination without abnormal findings: Secondary | ICD-10-CM

## 2014-03-10 DIAGNOSIS — M159 Polyosteoarthritis, unspecified: Secondary | ICD-10-CM

## 2014-03-10 DIAGNOSIS — E785 Hyperlipidemia, unspecified: Secondary | ICD-10-CM

## 2014-03-10 DIAGNOSIS — M150 Primary generalized (osteo)arthritis: Secondary | ICD-10-CM

## 2014-03-10 LAB — CBC WITH DIFFERENTIAL/PLATELET
BASOS PCT: 0.8 % (ref 0.0–3.0)
Basophils Absolute: 0.1 10*3/uL (ref 0.0–0.1)
Eosinophils Absolute: 0.1 10*3/uL (ref 0.0–0.7)
Eosinophils Relative: 0.7 % (ref 0.0–5.0)
HCT: 39.1 % (ref 36.0–46.0)
Hemoglobin: 12.8 g/dL (ref 12.0–15.0)
LYMPHS ABS: 2.4 10*3/uL (ref 0.7–4.0)
Lymphocytes Relative: 33.9 % (ref 12.0–46.0)
MCHC: 32.6 g/dL (ref 30.0–36.0)
MCV: 93.4 fl (ref 78.0–100.0)
Monocytes Absolute: 0.5 10*3/uL (ref 0.1–1.0)
Monocytes Relative: 7.4 % (ref 3.0–12.0)
NEUTROS PCT: 57.2 % (ref 43.0–77.0)
Neutro Abs: 4 10*3/uL (ref 1.4–7.7)
PLATELETS: 249 10*3/uL (ref 150.0–400.0)
RBC: 4.19 Mil/uL (ref 3.87–5.11)
RDW: 14.2 % (ref 11.5–15.5)
WBC: 7 10*3/uL (ref 4.0–10.5)

## 2014-03-10 LAB — COMPREHENSIVE METABOLIC PANEL
ALBUMIN: 4.5 g/dL (ref 3.5–5.2)
ALT: 22 U/L (ref 0–35)
AST: 25 U/L (ref 0–37)
Alkaline Phosphatase: 38 U/L — ABNORMAL LOW (ref 39–117)
BILIRUBIN TOTAL: 1 mg/dL (ref 0.2–1.2)
BUN: 17 mg/dL (ref 6–23)
CO2: 24 meq/L (ref 19–32)
CREATININE: 1 mg/dL (ref 0.4–1.2)
Calcium: 9.2 mg/dL (ref 8.4–10.5)
Chloride: 102 mEq/L (ref 96–112)
GFR: 62.5 mL/min (ref >60.00)
GLUCOSE: 67 mg/dL — AB (ref 70–99)
Potassium: 4.1 mEq/L (ref 3.5–5.1)
Sodium: 134 mEq/L — ABNORMAL LOW (ref 135–145)
TOTAL PROTEIN: 7.4 g/dL (ref 6.0–8.3)

## 2014-03-10 LAB — TSH: TSH: 2.32 u[IU]/mL (ref 0.35–4.50)

## 2014-03-10 LAB — SEDIMENTATION RATE: Sed Rate: 7 mm/hr (ref 0–22)

## 2014-03-10 MED ORDER — ALPRAZOLAM 0.5 MG PO TABS: 0.5000 mg | ORAL_TABLET | Freq: Every evening | ORAL | Status: DC | PRN

## 2014-03-10 MED ORDER — ALPRAZOLAM 0.5 MG PO TABS: 0.5000 mg | ORAL_TABLET | Freq: Three times a day (TID) | ORAL | Status: DC | PRN

## 2014-03-10 MED ORDER — LISINOPRIL 10 MG PO TABS: 10.0000 mg | ORAL_TABLET | Freq: Every day | ORAL | Status: DC

## 2014-03-10 MED ORDER — DIAZEPAM 5 MG PO TABS: 5.0000 mg | ORAL_TABLET | Freq: Every day | ORAL | Status: DC | PRN

## 2014-03-10 MED ORDER — SIMVASTATIN 20 MG PO TABS: 20.0000 mg | ORAL_TABLET | Freq: Every day | ORAL | Status: DC

## 2014-03-10 NOTE — Progress Notes (Signed)
Subjective:    Patient ID: Cassandra Mcfarland, female    DOB: 1950-10-23, 64 y.o.   MRN: [id number]  HPI  64 year old patient who is seen today for a health maintenance exam.  She has had a recent gynecologic exam She states that she has felt poorly for the past 6 months with general weakness and fatigue.  She describes poor energy level.  Additionally, she describes muscle pain and discomfort.  In spite of her symptoms.  She does get to the gym 3-4 times per week.  No change in weight.  No recent lab Past medical history is pertinent for a history of hyperparathyroidism.  She is status post resection.  She has hypothyroidism and has been compliant with her medications.  She has a history of dyslipidemia and has been on statin therapy.  She has mild hypertension and has been on low-dose lisinopril.  Blood pressure readings generally have been in a low-normal range  Past Medical History  Diagnosis Date  . Osteoarthritis   . Thyroid disease     hyper parathyroidism  . Anxiety   . Depression   . Situational depression   . Situational anxiety 09/2003    History   Social History  . Marital Status: Married    Spouse Name: N/A    Number of Children: N/A  . Years of Education: N/A   Occupational History  . Not on file.   Social History Main Topics  . Smoking status: Never Smoker   . Smokeless tobacco: Never Used  . Alcohol Use: No  . Drug Use: No  . Sexual Activity:    Partners: Male    Birth Control/ Protection: Post-menopausal   Other Topics Concern  . Not on file   Social History Narrative    Past Surgical History  Procedure Laterality Date  . Thumb fusion      Dr. Loney Loh  . Toatal knee replacement Right 2001  . Abddominoplasty  1995  . Parathyroidectomy  5/1/112    due to hypercalcemia  . Tubal ligation  1994  . Breast reduction surgery  09/2001    & Lift  . Thumb fusion  08/2007    with bone graft- post trauma  . Appendectomy  Age 64    Family History    Problem Relation Age of Onset  . Coronary artery disease Father   . Heart disease Father   . Heart attack Father   . Coronary artery disease Brother   . Hypertension Brother   . Cancer Mother   . Leukemia Mother 31  . Osteoporosis Mother   . Hypertension Sister   . Osteoporosis Sister   . Osteoporosis Maternal Aunt   . Hypertension Brother     Allergies  Allergen Reactions  . Ciprofloxacin Other (See Comments)    Severe burning, blisters on toes  . Codeine Nausea And Vomiting  . Tetanus-Diphtheria Toxoids Td Swelling    Current Outpatient Prescriptions on File Prior to Visit  Medication Sig Dispense Refill  . diclofenac (VOLTAREN) 75 MG EC tablet TAKE 1 TABLET BY MOUTH TWICE DAILY AFTER A MEAL 180 tablet 4  . estrogen-methylTESTOSTERone (EST ESTROGENS-METHYLTEST HS) 0.625-1.25 MG per tablet Take 1 tablet by mouth daily. 90 tablet 1  . medroxyPROGESTERone (PROVERA) 10 MG tablet Take 1 tablet (10 mg total) by mouth daily. Take one by mouth days 1-7 of the first of every month. 90 tablet 1  . Multiple Vitamin (MULTIVITAMIN) capsule Take 1 capsule by mouth daily.  No current facility-administered medications on file prior to visit.    BP 118/80 mmHg  Pulse 60  Temp(Src) 97.4 F (36.3 C) (Oral)  Resp 18  Ht 5' 4.75" (1.645 m)  Wt 133 lb (60.328 kg)  BMI 22.29 kg/m2  SpO2 95%  LMP 03/07/2014     Review of Systems  Constitutional: Positive for activity change and fatigue. Negative for fever, appetite change and unexpected weight change.  HENT: Negative for congestion, dental problem, ear pain, hearing loss, mouth sores, nosebleeds, sinus pressure, sore throat, tinnitus, trouble swallowing and voice change.   Eyes: Negative for photophobia, pain, redness and visual disturbance.  Respiratory: Negative for cough, chest tightness and shortness of breath.   Cardiovascular: Negative for chest pain, palpitations and leg swelling.  Gastrointestinal: Negative for nausea,  vomiting, abdominal pain, diarrhea, constipation, blood in stool, abdominal distention and rectal pain.  Genitourinary: Negative for dysuria, urgency, frequency, hematuria, flank pain, vaginal bleeding, vaginal discharge, difficulty urinating, genital sores, vaginal pain, menstrual problem and pelvic pain.  Musculoskeletal: Positive for myalgias. Negative for back pain, arthralgias and neck stiffness.  Skin: Negative for rash.  Neurological: Positive for weakness. Negative for dizziness, syncope, speech difficulty, light-headedness, numbness and headaches.  Hematological: Negative for adenopathy. Does not bruise/bleed easily.  Psychiatric/Behavioral: Negative for suicidal ideas, behavioral problems, self-injury, dysphoric mood and agitation. The patient is not nervous/anxious.        Objective:   Physical Exam  Constitutional: She is oriented to person, place, and time. She appears well-developed and well-nourished.  HENT:  Head: Normocephalic and atraumatic.  Right Ear: External ear normal.  Left Ear: External ear normal.  Mouth/Throat: Oropharynx is clear and moist.  Eyes: Conjunctivae and EOM are normal.  Neck: Normal range of motion. Neck supple. No JVD present. No thyromegaly present.  Cardiovascular: Normal rate, regular rhythm, normal heart sounds and intact distal pulses.   No murmur heard. Pulmonary/Chest: Effort normal and breath sounds normal. She has no wheezes. She has no rales.  Abdominal: Soft. Bowel sounds are normal. She exhibits no distension and no mass. There is no tenderness. There is no rebound and no guarding.  Musculoskeletal: Normal range of motion. She exhibits no edema or tenderness.  Neurological: She is alert and oriented to person, place, and time. She has normal reflexes. No cranial nerve deficit. She exhibits normal muscle tone. Coordination normal.  Skin: Skin is warm and dry. No rash noted.  Surgical scar right knee Soft tissue swelling right lateral  ankle  Psychiatric: She has a normal mood and affect. Her behavior is normal.          Assessment & Plan:   Preventive health exam Generalized fatigue.  We'll check updated lab.  Will have statin therapy and lisinopril.  Place on hold.  Follow-up 6 weeks with lipid panel Hypertension.  Blood pressure low normal.  Hold lisinopril Hypothyroidism.  We'll check TSH History of primary hyperparathyroidism.  Will check a serum calcium

## 2014-03-10 NOTE — Progress Notes (Signed)
Pre visit review using our clinic review tool, if applicable. No additional management support is needed unless otherwise documented below in the visit note. 

## 2014-03-10 NOTE — Patient Instructions (Addendum)
It is important that you exercise regularly, at least 20 minutes 3 to 4 times per week.  If you develop chest pain or shortness of breath seek  medical attention.  Return in 6 weeks for follow-up Health Maintenance Adopting a healthy lifestyle and getting preventive care can go a long way to promote health and wellness. Talk with your health care provider about what schedule of regular examinations is right for you. This is a good chance for you to check in with your provider about disease prevention and staying healthy. In between checkups, there are plenty of things you can do on your own. Experts have done a lot of research about which lifestyle changes and preventive measures are most likely to keep you healthy. Ask your health care provider for more information. WEIGHT AND DIET  Eat a healthy diet  Be sure to include plenty of vegetables, fruits, low-fat dairy products, and lean protein.  Do not eat a lot of foods high in solid fats, added sugars, or salt.  Get regular exercise. This is one of the most important things you can do for your health.  Most adults should exercise for at least 150 minutes each week. The exercise should increase your heart rate and make you sweat (moderate-intensity exercise).  Most adults should also do strengthening exercises at least twice a week. This is in addition to the moderate-intensity exercise.  Maintain a healthy weight  Body mass index (BMI) is a measurement that can be used to identify possible weight problems. It estimates body fat based on height and weight. Your health care provider can help determine your BMI and help you achieve or maintain a healthy weight.  For females 86 years of age and older:   A BMI below 18.5 is considered underweight.  A BMI of 18.5 to 24.9 is normal.  A BMI of 25 to 29.9 is considered overweight.  A BMI of 30 and above is considered obese.  Watch levels of cholesterol and blood lipids  You should start  having your blood tested for lipids and cholesterol at 63 years of age, then have this test every 5 years.  You may need to have your cholesterol levels checked more often if:  Your lipid or cholesterol levels are high.  You are older than 63 years of age.  You are at high risk for heart disease.  CANCER SCREENING   Lung Cancer  Lung cancer screening is recommended for adults 43-45 years old who are at high risk for lung cancer because of a history of smoking.  A yearly low-dose CT scan of the lungs is recommended for people who:  Currently smoke.  Have quit within the past 15 years.  Have at least a 30-pack-year history of smoking. A pack year is smoking an average of one pack of cigarettes a day for 1 year.  Yearly screening should continue until it has been 15 years since you quit.  Yearly screening should stop if you develop a health problem that would prevent you from having lung cancer treatment.  Breast Cancer  Practice breast self-awareness. This means understanding how your breasts normally appear and feel.  It also means doing regular breast self-exams. Let your health care provider know about any changes, no matter how small.  If you are in your 20s or 30s, you should have a clinical breast exam (CBE) by a health care provider every 1-3 years as part of a regular health exam.  If you are 40 or  older, have a CBE every year. Also consider having a breast X-ray (mammogram) every year.  If you have a family history of breast cancer, talk to your health care provider about genetic screening.  If you are at high risk for breast cancer, talk to your health care provider about having an MRI and a mammogram every year.  Breast cancer gene (BRCA) assessment is recommended for women who have family members with BRCA-related cancers. BRCA-related cancers include:  Breast.  Ovarian.  Tubal.  Peritoneal cancers.  Results of the assessment will determine the need for  genetic counseling and BRCA1 and BRCA2 testing. Cervical Cancer Routine pelvic examinations to screen for cervical cancer are no longer recommended for nonpregnant women who are considered low risk for cancer of the pelvic organs (ovaries, uterus, and vagina) and who do not have symptoms. A pelvic examination may be necessary if you have symptoms including those associated with pelvic infections. Ask your health care provider if a screening pelvic exam is right for you.   The Pap test is the screening test for cervical cancer for women who are considered at risk.  If you had a hysterectomy for a problem that was not cancer or a condition that could lead to cancer, then you no longer need Pap tests.  If you are older than 65 years, and you have had normal Pap tests for the past 10 years, you no longer need to have Pap tests.  If you have had past treatment for cervical cancer or a condition that could lead to cancer, you need Pap tests and screening for cancer for at least 20 years after your treatment.  If you no longer get a Pap test, assess your risk factors if they change (such as having a new sexual partner). This can affect whether you should start being screened again.  Some women have medical problems that increase their chance of getting cervical cancer. If this is the case for you, your health care provider may recommend more frequent screening and Pap tests.  The human papillomavirus (HPV) test is another test that may be used for cervical cancer screening. The HPV test looks for the virus that can cause cell changes in the cervix. The cells collected during the Pap test can be tested for HPV.  The HPV test can be used to screen women 74 years of age and older. Getting tested for HPV can extend the interval between normal Pap tests from three to five years.  An HPV test also should be used to screen women of any age who have unclear Pap test results.  After 63 years of age, women  should have HPV testing as often as Pap tests.  Colorectal Cancer  This type of cancer can be detected and often prevented.  Routine colorectal cancer screening usually begins at 63 years of age and continues through 63 years of age.  Your health care provider may recommend screening at an earlier age if you have risk factors for colon cancer.  Your health care provider may also recommend using home test kits to check for hidden blood in the stool.  A small camera at the end of a tube can be used to examine your colon directly (sigmoidoscopy or colonoscopy). This is done to check for the earliest forms of colorectal cancer.  Routine screening usually begins at age 4.  Direct examination of the colon should be repeated every 5-10 years through 63 years of age. However, you may need to be  screened more often if early forms of precancerous polyps or small growths are found. Skin Cancer  Check your skin from head to toe regularly.  Tell your health care provider about any new moles or changes in moles, especially if there is a change in a mole's shape or color.  Also tell your health care provider if you have a mole that is larger than the size of a pencil eraser.  Always use sunscreen. Apply sunscreen liberally and repeatedly throughout the day.  Protect yourself by wearing long sleeves, pants, a wide-brimmed hat, and sunglasses whenever you are outside. HEART DISEASE, DIABETES, AND HIGH BLOOD PRESSURE   Have your blood pressure checked at least every 1-2 years. High blood pressure causes heart disease and increases the risk of stroke.  If you are between 72 years and 40 years old, ask your health care provider if you should take aspirin to prevent strokes.  Have regular diabetes screenings. This involves taking a blood sample to check your fasting blood sugar level.  If you are at a normal weight and have a low risk for diabetes, have this test once every three years after 63 years  of age.  If you are overweight and have a high risk for diabetes, consider being tested at a younger age or more often. PREVENTING INFECTION  Hepatitis B  If you have a higher risk for hepatitis B, you should be screened for this virus. You are considered at high risk for hepatitis B if:  You were born in a country where hepatitis B is common. Ask your health care provider which countries are considered high risk.  Your parents were born in a high-risk country, and you have not been immunized against hepatitis B (hepatitis B vaccine).  You have HIV or AIDS.  You use needles to inject street drugs.  You live with someone who has hepatitis B.  You have had sex with someone who has hepatitis B.  You get hemodialysis treatment.  You take certain medicines for conditions, including cancer, organ transplantation, and autoimmune conditions. Hepatitis C  Blood testing is recommended for:  Everyone born from [birth year] through 1964.  Anyone with known risk factors for hepatitis C. Sexually transmitted infections (STIs)  You should be screened for sexually transmitted infections (STIs) including gonorrhea and chlamydia if:  You are sexually active and are younger than 63 years of age.  You are older than 63 years of age and your health care provider tells you that you are at risk for this type of infection.  Your sexual activity has changed since you were last screened and you are at an increased risk for chlamydia or gonorrhea. Ask your health care provider if you are at risk.  If you do not have HIV, but are at risk, it may be recommended that you take a prescription medicine daily to prevent HIV infection. This is called pre-exposure prophylaxis (PrEP). You are considered at risk if:  You are sexually active and do not regularly use condoms or know the HIV status of your partner(s).  You take drugs by injection.  You are sexually active with a partner who has HIV. Talk with your  health care provider about whether you are at high risk of being infected with HIV. If you choose to begin PrEP, you should first be tested for HIV. You should then be tested every 3 months for as long as you are taking PrEP.  PREGNANCY   If you are premenopausal and you  may become pregnant, ask your health care provider about preconception counseling.  If you may become pregnant, take 400 to 800 micrograms (mcg) of folic acid every day.  If you want to prevent pregnancy, talk to your health care provider about birth control (contraception). OSTEOPOROSIS AND MENOPAUSE   Osteoporosis is a disease in which the bones lose minerals and strength with aging. This can result in serious bone fractures. Your risk for osteoporosis can be identified using a bone density scan.  If you are 35 years of age or older, or if you are at risk for osteoporosis and fractures, ask your health care provider if you should be screened.  Ask your health care provider whether you should take a calcium or vitamin D supplement to lower your risk for osteoporosis.  Menopause may have certain physical symptoms and risks.  Hormone replacement therapy may reduce some of these symptoms and risks. Talk to your health care provider about whether hormone replacement therapy is right for you.  HOME CARE INSTRUCTIONS   Schedule regular health, dental, and eye exams.  Stay current with your immunizations.   Do not use any tobacco products including cigarettes, chewing tobacco, or electronic cigarettes.  If you are pregnant, do not drink alcohol.  If you are breastfeeding, limit how much and how often you drink alcohol.  Limit alcohol intake to no more than 1 drink per day for nonpregnant women. One drink equals 12 ounces of beer, 5 ounces of wine, or 1 ounces of hard liquor.  Do not use street drugs.  Do not share needles.  Ask your health care provider for help if you need support or information about quitting  drugs.  Tell your health care provider if you often feel depressed.  Tell your health care provider if you have ever been abused or do not feel safe at home. Document Released: 08/29/2010 Document Revised: 06/30/2013 Document Reviewed: 01/15/2013 [facility] Patient Information 2015 [place], [place]. This information is not intended to replace advice given to you by your health care provider. Make sure you discuss any questions you have with your health care provider.

## 2014-04-14 ENCOUNTER — Telehealth: Payer: Self-pay | Admitting: Internal Medicine

## 2014-04-14 DIAGNOSIS — E785 Hyperlipidemia, unspecified: Secondary | ICD-10-CM

## 2014-04-14 NOTE — Telephone Encounter (Signed)
Pt would like blood work results from 02-2014. Per AVS follow up in 6 wk with lipid panel. Can pt have labs in advance is so please put order in system.

## 2014-04-14 NOTE — Telephone Encounter (Signed)
Spoke to pt, told her labs were normal and I put order in for Lipid panel to be done on the 23 rd of this month, need to be fasting. Pt verbalized understanding. Also need to schedule lab appt only and then follow up day or two later with Dr.K. Pt verbalized understanding.

## 2014-04-15 ENCOUNTER — Other Ambulatory Visit: Payer: Self-pay | Admitting: Internal Medicine

## 2014-04-22 ENCOUNTER — Other Ambulatory Visit (INDEPENDENT_AMBULATORY_CARE_PROVIDER_SITE_OTHER): Payer: No Typology Code available for payment source

## 2014-04-22 DIAGNOSIS — E785 Hyperlipidemia, unspecified: Secondary | ICD-10-CM

## 2014-04-22 LAB — LIPID PANEL
CHOL/HDL RATIO: 5
CHOLESTEROL: 253 mg/dL — AB (ref 0–200)
HDL: 47 mg/dL (ref >39.00)
LDL Cholesterol: 174 mg/dL — ABNORMAL HIGH (ref 0–99)
NonHDL: 206
Triglycerides: 159 mg/dL — ABNORMAL HIGH (ref 0.0–149.0)
VLDL: 31.8 mg/dL (ref 0.0–40.0)

## 2014-04-29 ENCOUNTER — Ambulatory Visit (INDEPENDENT_AMBULATORY_CARE_PROVIDER_SITE_OTHER): Payer: No Typology Code available for payment source | Admitting: Internal Medicine

## 2014-04-29 ENCOUNTER — Encounter: Payer: Self-pay | Admitting: Internal Medicine

## 2014-04-29 VITALS — BP 160/100 | HR 62 | Temp 97.8°F | Resp 20 | Ht 64.75 in | Wt 133.0 lb

## 2014-04-29 DIAGNOSIS — E785 Hyperlipidemia, unspecified: Secondary | ICD-10-CM

## 2014-04-29 DIAGNOSIS — I10 Essential (primary) hypertension: Secondary | ICD-10-CM

## 2014-04-29 DIAGNOSIS — M159 Polyosteoarthritis, unspecified: Secondary | ICD-10-CM

## 2014-04-29 DIAGNOSIS — M150 Primary generalized (osteo)arthritis: Secondary | ICD-10-CM

## 2014-04-29 DIAGNOSIS — E039 Hypothyroidism, unspecified: Secondary | ICD-10-CM

## 2014-04-29 MED ORDER — LISINOPRIL 10 MG PO TABS: 10.0000 mg | ORAL_TABLET | Freq: Every day | ORAL | Status: DC

## 2014-04-29 NOTE — Progress Notes (Signed)
Pre visit review using our clinic review tool, if applicable. No additional management support is needed unless otherwise documented below in the visit note. 

## 2014-04-29 NOTE — Progress Notes (Signed)
Subjective:    Patient ID: Cassandra Mcfarland, female    DOB: Jan 10, 1951, 64 y.o.   MRN: [id number]  HPI  64 year old patient who is seen today in follow-up.  She was seen for a physical recently and felt quite poorly with weakness and myalgias.  Statin therapy was held as well as lisinopril.  Within 3 days.  She was dramatically improved.  Repeat lipid profile off therapy.  Discussed and reviewed as well as current guidelines concerning treatment of hypercholesterolemia Blood pressure elevated today  Past Medical History  Diagnosis Date  . Osteoarthritis   . Thyroid disease     hyper parathyroidism  . Anxiety   . Depression   . Situational depression   . Situational anxiety 09/2003    History   Social History  . Marital Status: Married    Spouse Name: N/A  . Number of Children: N/A  . Years of Education: N/A   Occupational History  . Not on file.   Social History Main Topics  . Smoking status: Never Smoker   . Smokeless tobacco: Never Used  . Alcohol Use: No  . Drug Use: No  . Sexual Activity:    Partners: Male    Birth Control/ Protection: Post-menopausal   Other Topics Concern  . Not on file   Social History Narrative    Past Surgical History  Procedure Laterality Date  . Thumb fusion      Dr. Loney Loh  . Toatal knee replacement Right 2001  . Abddominoplasty  1995  . Parathyroidectomy  5/1/112    due to hypercalcemia  . Tubal ligation  1994  . Breast reduction surgery  09/2001    & Lift  . Thumb fusion  08/2007    with bone graft- post trauma  . Appendectomy  Age 22    Family History  Problem Relation Age of Onset  . Coronary artery disease Father   . Heart disease Father   . Heart attack Father   . Coronary artery disease Brother   . Hypertension Brother   . Cancer Mother   . Leukemia Mother 3  . Osteoporosis Mother   . Hypertension Sister   . Osteoporosis Sister   . Osteoporosis Maternal Aunt   . Hypertension Brother     Allergies    Allergen Reactions  . Ciprofloxacin Other (See Comments)    Severe burning, blisters on toes  . Codeine Nausea And Vomiting  . Tetanus-Diphtheria Toxoids Td Swelling    Current Outpatient Prescriptions on File Prior to Visit  Medication Sig Dispense Refill  . ALPRAZolam (XANAX) 0.5 MG tablet Take 1 tablet (0.5 mg total) by mouth at bedtime as needed. 90 tablet 1  . diazepam (VALIUM) 5 MG tablet Take 1 tablet (5 mg total) by mouth daily as needed for anxiety. 90 tablet 1  . diclofenac (VOLTAREN) 75 MG EC tablet TAKE 1 TABLET BY MOUTH TWICE DAILY AFTER A MEAL 180 tablet 4  . estrogen-methylTESTOSTERone (EST ESTROGENS-METHYLTEST HS) 0.625-1.25 MG per tablet Take 1 tablet by mouth daily. 90 tablet 1  . medroxyPROGESTERone (PROVERA) 10 MG tablet Take 1 tablet (10 mg total) by mouth daily. Take one by mouth days 1-7 of the first of every month. 90 tablet 1  . Multiple Vitamin (MULTIVITAMIN) capsule Take 1 capsule by mouth daily.       No current facility-administered medications on file prior to visit.    BP 160/100 mmHg  Pulse 62  Temp(Src) 97.8 F (36.6 C) (Oral)  Resp 20  Ht 5' 4.75" (1.645 m)  Wt 133 lb (60.328 kg)  BMI 22.29 kg/m2  SpO2 96%  LMP 04/07/2014       Review of Systems  Constitutional: Negative.   HENT: Negative for congestion, dental problem, hearing loss, rhinorrhea, sinus pressure, sore throat and tinnitus.   Eyes: Negative for pain, discharge and visual disturbance.  Respiratory: Negative for cough and shortness of breath.   Cardiovascular: Negative for chest pain, palpitations and leg swelling.  Gastrointestinal: Negative for nausea, vomiting, abdominal pain, diarrhea, constipation, blood in stool and abdominal distention.  Genitourinary: Negative for dysuria, urgency, frequency, hematuria, flank pain, vaginal bleeding, vaginal discharge, difficulty urinating, vaginal pain and pelvic pain.  Musculoskeletal: Negative for joint swelling, arthralgias and gait  problem.  Skin: Negative for rash.  Neurological: Negative for dizziness, syncope, speech difficulty, weakness, numbness and headaches.  Hematological: Negative for adenopathy.  Psychiatric/Behavioral: Negative for behavioral problems, dysphoric mood and agitation. The patient is not nervous/anxious.        Objective:   Physical Exam  Constitutional: She appears well-developed and well-nourished. No distress.  Blood pressure 140/94          Assessment & Plan:   Hypertension.  Resume lisinopril.  Low-salt diet recommended.  Home blood pressure monitoring recommended.  Patient will call if blood pressure is consistently greater than 140 over 90 Dyslipidemia.  Options discussed including current guidelines for treatment with statin drugs. CHD risk is 6.1 percent over 10 years and optimize risk is 2.9 percent over 10 years.  Patient does not wish to consider other statin drugs  Return in one year for annual physical

## 2014-04-29 NOTE — Patient Instructions (Signed)
Resume lisinopril  Limit your sodium (Salt) intake  Please check your blood pressure on a regular basis.  If it is consistently greater than 150/90, please make an office appointment.  Fat and Cholesterol Control Diet Fat and cholesterol levels in your blood and organs are influenced by your diet. High levels of fat and cholesterol may lead to diseases of the heart, small and large blood vessels, gallbladder, liver, and pancreas. CONTROLLING FAT AND CHOLESTEROL WITH DIET Although exercise and lifestyle factors are important, your diet is key. That is because certain foods are known to raise cholesterol and others to lower it. The goal is to balance foods for their effect on cholesterol and more importantly, to replace saturated and trans fat with other types of fat, such as monounsaturated fat, polyunsaturated fat, and omega-3 fatty acids. On average, a person should consume no more than 15 to 17 g of saturated fat daily. Saturated and trans fats are considered "bad" fats, and they will raise LDL cholesterol. Saturated fats are primarily found in animal products such as meats, butter, and cream. However, that does not mean you need to give up all your favorite foods. Today, there are good tasting, low-fat, low-cholesterol substitutes for most of the things you like to eat. Choose low-fat or nonfat alternatives. Choose round or loin cuts of red meat. These types of cuts are lowest in fat and cholesterol. Chicken (without the skin), fish, veal, and ground [place] breast are great choices. Eliminate fatty meats, such as hot dogs and salami. Even shellfish have little or no saturated fat. Have a 3 oz (85 g) portion when you eat lean meat, poultry, or fish. Trans fats are also called "partially hydrogenated oils." They are oils that have been scientifically manipulated so that they are solid at room temperature resulting in a longer shelf life and improved taste and texture of foods in which they are added.  Trans fats are found in stick margarine, some tub margarines, cookies, crackers, and baked goods.  When baking and cooking, oils are a great substitute for butter. The monounsaturated oils are especially beneficial since it is believed they lower LDL and raise HDL. The oils you should avoid entirely are saturated tropical oils, such as coconut and palm.  Remember to eat a lot from food groups that are naturally free of saturated and trans fat, including fish, fruit, vegetables, beans, grains (barley, rice, couscous, bulgur wheat), and pasta (without cream sauces).  IDENTIFYING FOODS THAT LOWER FAT AND CHOLESTEROL  Soluble fiber may lower your cholesterol. This type of fiber is found in fruits such as apples, vegetables such as broccoli, potatoes, and carrots, legumes such as beans, peas, and lentils, and grains such as barley. Foods fortified with plant sterols (phytosterol) may also lower cholesterol. You should eat at least 2 g per day of these foods for a cholesterol lowering effect.  Read package labels to identify low-saturated fats, trans fat free, and low-fat foods at the supermarket. Select cheeses that have only 2 to 3 g saturated fat per ounce. Use a heart-healthy tub margarine that is free of trans fats or partially hydrogenated oil. When buying baked goods (cookies, crackers), avoid partially hydrogenated oils. Breads and muffins should be made from whole grains (whole-wheat or whole oat flour, instead of "flour" or "enriched flour"). Buy non-creamy canned soups with reduced salt and no added fats.  FOOD PREPARATION TECHNIQUES  Never deep-fry. If you must fry, either stir-fry, which uses very little fat, or use non-stick cooking sprays.  When possible, broil, bake, or roast meats, and steam vegetables. Instead of putting butter or margarine on vegetables, use lemon and herbs, applesauce, and cinnamon (for squash and sweet potatoes). Use nonfat yogurt, salsa, and low-fat dressings for salads.   LOW-SATURATED FAT / LOW-FAT FOOD SUBSTITUTES Meats / Saturated Fat (g)  Avoid: Steak, marbled (3 oz/85 g) / 11 g  Choose: Steak, lean (3 oz/85 g) / 4 g  Avoid: Hamburger (3 oz/85 g) / 7 g  Choose: Hamburger, lean (3 oz/85 g) / 5 g  Avoid: Ham (3 oz/85 g) / 6 g  Choose: Ham, lean cut (3 oz/85 g) / 2.4 g  Avoid: Chicken, with skin, dark meat (3 oz/85 g) / 4 g  Choose: Chicken, skin removed, dark meat (3 oz/85 g) / 2 g  Avoid: Chicken, with skin, light meat (3 oz/85 g) / 2.5 g  Choose: Chicken, skin removed, light meat (3 oz/85 g) / 1 g Dairy / Saturated Fat (g)  Avoid: Whole milk (1 cup) / 5 g  Choose: Low-fat milk, 2% (1 cup) / 3 g  Choose: Low-fat milk, 1% (1 cup) / 1.5 g  Choose: Skim milk (1 cup) / 0.3 g  Avoid: Hard cheese (1 oz/28 g) / 6 g  Choose: Skim milk cheese (1 oz/28 g) / 2 to 3 g  Avoid: Cottage cheese, 4% fat (1 cup) / 6.5 g  Choose: Low-fat cottage cheese, 1% fat (1 cup) / 1.5 g  Avoid: Ice cream (1 cup) / 9 g  Choose: Sherbet (1 cup) / 2.5 g  Choose: Nonfat frozen yogurt (1 cup) / 0.3 g  Choose: Frozen fruit bar / trace  Avoid: Whipped cream (1 tbs) / 3.5 g  Choose: Nondairy whipped topping (1 tbs) / 1 g Condiments / Saturated Fat (g)  Avoid: Mayonnaise (1 tbs) / 2 g  Choose: Low-fat mayonnaise (1 tbs) / 1 g  Avoid: Butter (1 tbs) / 7 g  Choose: Extra light margarine (1 tbs) / 1 g  Avoid: Coconut oil (1 tbs) / 11.8 g  Choose: Olive oil (1 tbs) / 1.8 g  Choose: Corn oil (1 tbs) / 1.7 g  Choose: Safflower oil (1 tbs) / 1.2 g  Choose: Sunflower oil (1 tbs) / 1.4 g  Choose: Soybean oil (1 tbs) / 2.4 g  Choose: Canola oil (1 tbs) / 1 g Document Released: 02/13/2005 Document Revised: 06/10/2012 Document Reviewed: 05/14/2013 ExitCare Patient Information 2015 [place], [place]. This information is not intended to replace advice given to you by your health care provider. Make sure you discuss any questions you have with your health care  provider.

## 2014-05-01 ENCOUNTER — Telehealth: Payer: Self-pay | Admitting: Internal Medicine

## 2014-05-01 NOTE — Telephone Encounter (Signed)
emmi emailed °

## 2014-06-22 ENCOUNTER — Other Ambulatory Visit: Payer: Self-pay | Admitting: *Deleted

## 2014-06-22 DIAGNOSIS — N951 Menopausal and female climacteric states: Secondary | ICD-10-CM

## 2014-06-22 MED ORDER — EST ESTROGENS-METHYLTEST 0.625-1.25 MG PO TABS: 1.0000 | ORAL_TABLET | Freq: Every day | ORAL | Status: DC

## 2014-06-22 NOTE — Telephone Encounter (Addendum)
LM for pt to call back.

## 2014-06-22 NOTE — Telephone Encounter (Signed)
Patient is returning a call to [place]. She states she has not had a mammogram done since 2014. Pt. States Dr. Charlies Constable told her she was not due for a mammogram until 2016. She is in the hospital with her grandson who is being tested for Leukemia right now. She is asking for 1 mo of refill until she can get scheduled for a mammogram if possible.

## 2014-06-22 NOTE — Telephone Encounter (Signed)
Rx done for one month.  As pt on HRT, this makes her "higher risk" and should have yearly MMG.

## 2014-06-22 NOTE — Telephone Encounter (Signed)
Patient is returning a call to [place]. °

## 2014-06-22 NOTE — Telephone Encounter (Signed)
Pt returned call

## 2014-06-22 NOTE — Telephone Encounter (Signed)
Incoming fax from [facility]  Medication refill request: est estrgn methtest  Last AEX:  11/28/13 TL Next AEX: not scheduled  Last MMG (if hormonal medication request): 10/29/12 [id number]:Neg Refill authorized: 11/28/13 #[id number]/1R. Today please advise.   LM for pt to call back re: last MMG

## 2014-06-23 NOTE — Telephone Encounter (Signed)
Rx faxed to [facility].  Left voicemail for pt to call back re: only one month of her Rx. No more refills until MMG report.

## 2014-06-26 ENCOUNTER — Other Ambulatory Visit: Payer: Self-pay

## 2014-06-26 DIAGNOSIS — Z1231 Encounter for screening mammogram for malignant neoplasm of breast: Secondary | ICD-10-CM

## 2014-07-02 ENCOUNTER — Ambulatory Visit
Admission: RE | Admit: 2014-07-02 | Discharge: 2014-07-02 | Disposition: A | Discharge status: Home / Self Care | Payer: No Typology Code available for payment source | Source: Ambulatory Visit

## 2014-07-02 DIAGNOSIS — Z1231 Encounter for screening mammogram for malignant neoplasm of breast: Secondary | ICD-10-CM

## 2014-07-08 ENCOUNTER — Other Ambulatory Visit: Payer: Self-pay | Admitting: Internal Medicine

## 2014-08-10 ENCOUNTER — Other Ambulatory Visit: Payer: Self-pay | Admitting: Obstetrics & Gynecology

## 2014-08-10 NOTE — Telephone Encounter (Signed)
Medication refill request: EST ESTROGENS-METHYLTEST HS 0.625-1.25 MG  Last AEX:  11-28-13  Next AEX: 12-02-14  Last MMG (if hormonal medication request): 07-03-14 WNl Bi RADS 1  Refill authorized: please advise

## 2014-10-19 ENCOUNTER — Other Ambulatory Visit: Payer: Self-pay | Admitting: Internal Medicine

## 2014-11-06 ENCOUNTER — Telehealth: Payer: Self-pay | Admitting: Internal Medicine

## 2014-11-06 NOTE — Telephone Encounter (Signed)
Patient Name: Cassandra Mcfarland DOB: 08-29-1950 Initial Comment Caller states, has a question about whooping cough vaccines, Her grandson has cancer, his specialist says she needs to take this, but she may be allergic. Nurse Assessment Nurse: Vallery Sa, RN, [place] Date/Time (Eastern Time): 11/06/2014 1:42:27 PM Confirm and document reason for call. If symptomatic, describe symptoms. ---Caller states that she was advised to have the Whooping Cough vaccination due to 42 year old grandchild with cancer and a new baby in the family. She had a severe reaction to Tetanus in 1971 and is concerned about taking the Whooping Cough vaccination. She would like a callback from MD with medical direction. Has the patient traveled out of the country within the last 30 days? ---Not Applicable Does the patient require triage? ---No Guidelines Guideline Title Affirmed Question Affirmed Notes Final Disposition User Clinical Call [place], RN, [employer]

## 2014-11-06 NOTE — Telephone Encounter (Signed)
Called patient and she is aware

## 2014-11-06 NOTE — Telephone Encounter (Signed)
Pt is allergic to the Tetanus vaccine so she can not get the Tdap which has pertussis in it due to allergy. I would recommend pt wear mask around grandchild with cancer, and also with the new baby until they have there first Dtap injection.

## 2014-12-02 ENCOUNTER — Ambulatory Visit: Payer: No Typology Code available for payment source | Admitting: Nurse Practitioner

## 2014-12-16 ENCOUNTER — Other Ambulatory Visit: Payer: Self-pay | Admitting: Internal Medicine

## 2014-12-16 ENCOUNTER — Encounter: Payer: Self-pay | Admitting: Nurse Practitioner

## 2014-12-16 ENCOUNTER — Ambulatory Visit (INDEPENDENT_AMBULATORY_CARE_PROVIDER_SITE_OTHER): Payer: No Typology Code available for payment source | Admitting: Nurse Practitioner

## 2014-12-16 VITALS — BP 122/80 | HR 68 | Ht 64.25 in | Wt 131.0 lb

## 2014-12-16 DIAGNOSIS — Z01419 Encounter for gynecological examination (general) (routine) without abnormal findings: Secondary | ICD-10-CM

## 2014-12-16 DIAGNOSIS — Z0000 Encounter for general adult medical examination without abnormal findings: Secondary | ICD-10-CM

## 2014-12-16 NOTE — Progress Notes (Signed)
Patient ID: Cassandra Mcfarland, female   DOB: [birth year], 64 y.o.   MRN: [id number] 64 y.o. [id number] Married  Caucasian Fe here for annual exam. On HRT and still having withdrawal bleed for 5 days after the Provera 10 mg day 1-7 monthly.  The withdrawal occurs on day 2 with a heavy flow X 1 day requiring pad and tampon.  For past 2 months taking 1/2 tablet of Estratest HS and same dose of Provera.  This past menses was much better and lighter but still 5 days. She is very concerned and helping to care for her 9 1/2 yo grandson who is in treatment for leukemia.  She wants nothing extra done today and is only here to get HRT.  Patient's last menstrual period was 11/10/2014 (exact date).          Sexually active: Yes.    The current method of family planning is tubal ligation and post menopausal status.    Exercising: Yes.    yard and house work and taking care of 63 year old Smoker:  no  Health Maintenance: Pap:  11/24/11, Negative with neg HR HPV MMG:  07/02/14, Bi-Rads 1: Negative  Colonoscopy: 2009, normal, repeat in 10 years BMD: 12/01/10, T-Score -1.4 Spine / -1.5 Left Femur Neck / -2.1 Left Radius TDaP:  Allergic, not a candidate Shingles: not yet Labs:  January 2016 in EPIC   reports that she has never smoked. She has never used smokeless tobacco. She reports that she does not drink alcohol or use illicit drugs.  Past Medical History  Diagnosis Date  . Osteoarthritis   . Thyroid disease     hyper parathyroidism  . Anxiety   . Depression   . Situational depression   . Situational anxiety 09/2003    Past Surgical History  Procedure Laterality Date  . Thumb fusion      Dr. Loney Loh  . Toatal knee replacement Right 2001  . Abddominoplasty  1995  . Parathyroidectomy  5/1/112    due to hypercalcemia  . Tubal ligation  1994  . Breast reduction surgery  09/2001    & Lift  . Thumb fusion  08/2007    with bone graft- post trauma  . Appendectomy  Age 58    Current Outpatient Prescriptions   Medication Sig Dispense Refill  . ALPRAZolam (XANAX) 0.5 MG tablet TAKE 1 TABLET BY MOUTH EVERY NIGHT AT BEDTIME AS NEEDED 90 tablet 1  . diazepam (VALIUM) 5 MG tablet Take 1 tablet (5 mg total) by mouth daily as needed for anxiety. 90 tablet 1  . diclofenac (VOLTAREN) 75 MG EC tablet TAKE 1 TABLET BY MOUTH TWICE DAILY AFTER A MEAL 180 tablet 0  . EST ESTROGENS-METHYLTEST HS 0.625-1.25 MG per tablet TAKE 1 TABLET BY MOUTH EVERY DAY 30 tablet 4  . lisinopril (PRINIVIL,ZESTRIL) 10 MG tablet Take 1 tablet (10 mg total) by mouth daily. 90 tablet 3  . medroxyPROGESTERone (PROVERA) 10 MG tablet Take 1 tablet (10 mg total) by mouth daily. Take one by mouth days 1-7 of the first of every month. 90 tablet 1  . Multiple Vitamin (MULTIVITAMIN) capsule Take 1 capsule by mouth daily.       No current facility-administered medications for this visit.    Family History  Problem Relation Age of Onset  . Coronary artery disease Father   . Heart disease Father   . Heart attack Father   . Coronary artery disease Brother   . Hypertension Brother   .  Cancer Mother   . Leukemia Mother 33  . Osteoporosis Mother   . Hypertension Sister   . Osteoporosis Sister   . Osteoporosis Maternal Aunt   . Hypertension Brother     ROS:  Pertinent items are noted in HPI.  Otherwise, a comprehensive ROS was negative.  Exam:   BP 122/80 mmHg  Pulse 68  Ht 5' 4.25" (1.632 m)  Wt 131 lb (59.421 kg)  BMI 22.31 kg/m2  LMP 11/10/2014 (Exact Date) Height: 5' 4.25" (163.2 cm) Ht Readings from Last 3 Encounters:  12/16/14 5' 4.25" (1.632 m)  04/29/14 5' 4.75" (1.645 m)  03/10/14 5' 4.75" (1.645 m)    General appearance: alert, cooperative and appears stated age Head: Normocephalic, without obvious abnormality, atraumatic Neck: no adenopathy, supple, symmetrical, trachea midline and thyroid normal to inspection and palpation Lungs: clear to auscultation bilaterally Breasts: normal appearance, no masses or  tenderness Heart: regular rate and rhythm Abdomen: soft, non-tender; no masses,  no organomegaly Extremities: extremities normal, atraumatic, no cyanosis or edema Skin: Skin color, texture, turgor normal. No rashes or lesions Lymph nodes: Cervical, supraclavicular, and axillary nodes normal. No abnormal inguinal nodes palpated Neurologic: Grossly normal   Pelvic: External genitalia:  no lesions              Urethra:  normal appearing urethra with no masses, tenderness or lesions              Bartholin's and Skene's: normal                 Vagina: normal appearing vagina with normal color and discharge, no lesions              Cervix: anteverted              Pap taken: No. Bimanual Exam:  Uterus:  normal size, contour, position, consistency, mobility, non-tender              Adnexa: no mass, fullness, tenderness               Rectovaginal: Confirms               Anus:  normal sphincter tone, no lesions  Chaperone present: no  A:  Well Woman with normal exam  Postmenopausal on HRT and still with monthly withdrawal bleed  S/P parathyroidectomy secondary to hypercalcemia  History of hyperlipidemia  Situational anxiety     P:   Reviewed health and wellness pertinent to exam  Pap smear as above  Mammogram is due 06/2015  No refill given at this time of HRT - will need to consult with Dr. Collier Flowers with risk of DVT, CVA, cancer, etc  Counseled on breast self exam, mammography screening, use and side effects of HRT, adequate intake of calcium and vitamin D, diet and exercise return annually or prn  An After Visit Summary was printed and given to the patient.

## 2014-12-16 NOTE — Patient Instructions (Signed)

## 2014-12-17 NOTE — Progress Notes (Addendum)
Encounter reviewed by Dr. Aundria Rud.  I do recommend evaluation of the patient's bleeding with a sonohysterogram and endometrial biopsy.  No refill of HRT until completion of evaluation. Need to determine if this is appropriate.

## 2014-12-23 ENCOUNTER — Telehealth: Payer: Self-pay | Admitting: Nurse Practitioner

## 2014-12-23 NOTE — Telephone Encounter (Signed)
Patient is notified of the consult with Dr. Quincy Simmonds about her bleeding on HRT.  She is made aware that our main concern is to rule out endometrial cancer with her bleeding profile.  She is advised to have a PUS and endo biopsy.  She declined due to out of pocket cost - has a high deductible plan.  She is asked if maybe one of these can be done and she again declines.  She will at this time finish the current estrogen at 1/2 tablet daily and first of November will take the last of the Provera.  She does expect a withdrawal bleed.  She is aware that after going off HRT if any bleeding to call.  She has promised to call in 4-6 wks after the next bleed with a progress report.  She is also aware that if any further bleeding it is still our recommendation that she pursue evaluation.  She is aware that no further HRT is given.

## 2014-12-24 NOTE — Telephone Encounter (Signed)
I would prefer for the patient to return for a recheck appointment in 4 - 6 weeks so we can keep a close eye on her bleeding and menopausal symptoms.  Please call to have her see Patty or me please.  We want to be a good partner in her care!  [place]

## 2014-12-24 NOTE — Telephone Encounter (Signed)
Spoke with patient. Advised of message as seen below from [place]. Patient states "I am not having and problems or symptoms. I am just having a period. I am not having any bleeding in between." Advised it is very important that she have follow up regarding her bleeding. "I do not like to have appointments I do not need because I do not have insurance and it is expensive. You can relay the message to that too." Advised follow up appointment is recommended to monitor and provide the best care with her bleeding. Patient is agreeable. Appointment scheduled for 12/19 at 10:15 am with Kem Boroughs, FNP. Agreeable to date and time.  Cc; Kem Boroughs, FNP   Routing to provider for final review. Patient agreeable to disposition. Will close encounter.

## 2014-12-29 ENCOUNTER — Telehealth: Payer: Self-pay | Admitting: *Deleted

## 2014-12-29 NOTE — Telephone Encounter (Signed)
Follow-up call to patient. She has already talked with [facility] and scheduled follow-up on 02-15-15 with Edman Circle FNP. Patient states she feels like no one is listening to her about the bleeding. She has had significant less bleeding since decreased to half tablet of Estratest HS for last two months. States she has now discontinued Estratest HS completely and she is expecting that her cycles will stop completely.  She has high deductible insurance plan and feels she must be cost effective with her insurance and doesn't want to pay for unnecessary tests. She has agreed to come in for follow-up in December. Encouraged to call back if should have bleeding prior to appointment or if has menopausal symptoms since discontinuing Estratest HS.     Patient is concerned about cost of ultrasound if indicated. Advised I can have business office call with estimate. In effort to be cost effective, should we move follow-up appointment to MD on ultrasound day? Routing to Dr Quincy Simmonds for review.

## 2014-12-29 NOTE — Telephone Encounter (Signed)
-----   Message from Nunzio Cobbs, MD sent at 12/17/2014  6:07 AM EDT ----- Regarding: needs evalution of bleeding Hi Cassandra Mcfarland saw this patient yesterday and did not refill her HRT. Patient is postmenopausal and on cyclic HRT.  She is having heavy withdrawal bleeding.   I reviewed her care, and I do recommend proceeding with a sonohysterogram and endometrial biopsy prior to refilling any HRT.  No orders have been placed yet.   Patient is aware that we were reviewing her care.  She did not accept any further evaluation yesterday.     Thank you,   [facility]

## 2014-12-30 NOTE — Telephone Encounter (Signed)
Ok to see Edman Circle for her recheck on 02/15/15.  If patient has any further bleeding or spotting, will need sonohysterogram and EMB. Patty and I have reviewed her care.

## 2014-12-31 NOTE — Telephone Encounter (Signed)
Call to patient. Advised to keep scheduled appointment with Patty. Call if unusual bleeding between now and then. Patient agreeable. Patient spoke to [place] in business office regarding ultrasound costs and questions were answered. See account notes. Encounter closed.

## 2015-02-09 ENCOUNTER — Telehealth: Payer: Self-pay | Admitting: Nurse Practitioner

## 2015-02-09 NOTE — Telephone Encounter (Signed)
Patient canceled her 6 week recheck appointment 02/15/15. Patient did not wish to reschedule.

## 2015-02-15 ENCOUNTER — Ambulatory Visit: Payer: No Typology Code available for payment source | Admitting: Nurse Practitioner

## 2015-04-21 ENCOUNTER — Other Ambulatory Visit: Payer: Self-pay | Admitting: Internal Medicine

## 2015-04-28 ENCOUNTER — Other Ambulatory Visit (INDEPENDENT_AMBULATORY_CARE_PROVIDER_SITE_OTHER): Payer: BLUE CROSS/BLUE SHIELD

## 2015-04-28 DIAGNOSIS — Z0000 Encounter for general adult medical examination without abnormal findings: Secondary | ICD-10-CM | POA: Diagnosis not present

## 2015-04-28 LAB — HEPATIC FUNCTION PANEL
ALK PHOS: 53 U/L (ref 39–117)
ALT: 39 U/L — ABNORMAL HIGH (ref 0–35)
AST: 28 U/L (ref 0–37)
Albumin: 4.5 g/dL (ref 3.5–5.2)
BILIRUBIN DIRECT: 0.1 mg/dL (ref 0.0–0.3)
Total Bilirubin: 0.8 mg/dL (ref 0.2–1.2)
Total Protein: 7.1 g/dL (ref 6.0–8.3)

## 2015-04-28 LAB — CBC WITH DIFFERENTIAL/PLATELET
BASOS ABS: 0 10*3/uL (ref 0.0–0.1)
Basophils Relative: 0.6 % (ref 0.0–3.0)
EOS ABS: 0.1 10*3/uL (ref 0.0–0.7)
Eosinophils Relative: 1.2 % (ref 0.0–5.0)
HEMATOCRIT: 38.4 % (ref 36.0–46.0)
Hemoglobin: 13 g/dL (ref 12.0–15.0)
LYMPHS ABS: 2 10*3/uL (ref 0.7–4.0)
LYMPHS PCT: 35.9 % (ref 12.0–46.0)
MCHC: 33.8 g/dL (ref 30.0–36.0)
MCV: 91.1 fl (ref 78.0–100.0)
MONOS PCT: 8.9 % (ref 3.0–12.0)
Monocytes Absolute: 0.5 10*3/uL (ref 0.1–1.0)
NEUTROS ABS: 3 10*3/uL (ref 1.4–7.7)
NEUTROS PCT: 53.4 % (ref 43.0–77.0)
PLATELETS: 237 10*3/uL (ref 150.0–400.0)
RBC: 4.22 Mil/uL (ref 3.87–5.11)
RDW: 13.6 % (ref 11.5–15.5)
WBC: 5.6 10*3/uL (ref 4.0–10.5)

## 2015-04-28 LAB — POC URINALSYSI DIPSTICK (AUTOMATED)
BILIRUBIN UA: NEGATIVE
GLUCOSE UA: NEGATIVE
KETONES UA: NEGATIVE
Nitrite, UA: NEGATIVE
PH UA: 7.5
Protein, UA: NEGATIVE
RBC UA: NEGATIVE
SPEC GRAV UA: 1.015
Urobilinogen, UA: 0.2

## 2015-04-28 LAB — TSH: TSH: 1.72 u[IU]/mL (ref 0.35–4.50)

## 2015-04-28 LAB — BASIC METABOLIC PANEL
BUN: 19 mg/dL (ref 6–23)
CALCIUM: 9.9 mg/dL (ref 8.4–10.5)
CO2: 25 meq/L (ref 19–32)
CREATININE: 0.91 mg/dL (ref 0.40–1.20)
Chloride: 103 mEq/L (ref 96–112)
GFR: 66.24 mL/min (ref >60.00)
Glucose, Bld: 85 mg/dL (ref 70–99)
POTASSIUM: 4.8 meq/L (ref 3.5–5.1)
SODIUM: 138 meq/L (ref 135–145)

## 2015-04-28 LAB — LIPID PANEL
CHOL/HDL RATIO: 5
Cholesterol: 296 mg/dL — ABNORMAL HIGH (ref 0–200)
HDL: 53.9 mg/dL (ref >39.00)
LDL Cholesterol: 208 mg/dL — ABNORMAL HIGH (ref 0–99)
NonHDL: 241.74
Triglycerides: 170 mg/dL — ABNORMAL HIGH (ref 0.0–149.0)
VLDL: 34 mg/dL (ref 0.0–40.0)

## 2015-05-04 ENCOUNTER — Ambulatory Visit (INDEPENDENT_AMBULATORY_CARE_PROVIDER_SITE_OTHER): Payer: BLUE CROSS/BLUE SHIELD | Admitting: Internal Medicine

## 2015-05-04 ENCOUNTER — Encounter: Payer: Self-pay | Admitting: Internal Medicine

## 2015-05-04 VITALS — BP 120/70 | HR 69 | Temp 98.5°F | Resp 20 | Ht 64.5 in | Wt 131.0 lb

## 2015-05-04 DIAGNOSIS — Z0000 Encounter for general adult medical examination without abnormal findings: Secondary | ICD-10-CM | POA: Diagnosis not present

## 2015-05-04 DIAGNOSIS — E785 Hyperlipidemia, unspecified: Secondary | ICD-10-CM

## 2015-05-04 DIAGNOSIS — M150 Primary generalized (osteo)arthritis: Secondary | ICD-10-CM

## 2015-05-04 DIAGNOSIS — M159 Polyosteoarthritis, unspecified: Secondary | ICD-10-CM

## 2015-05-04 DIAGNOSIS — E039 Hypothyroidism, unspecified: Secondary | ICD-10-CM | POA: Diagnosis not present

## 2015-05-04 DIAGNOSIS — I10 Essential (primary) hypertension: Secondary | ICD-10-CM

## 2015-05-04 MED ORDER — ROSUVASTATIN CALCIUM 10 MG PO TABS: ORAL_TABLET | ORAL | Status: DC

## 2015-05-04 MED ORDER — DIAZEPAM 5 MG PO TABS: 5.0000 mg | ORAL_TABLET | Freq: Every day | ORAL | Status: DC | PRN

## 2015-05-04 NOTE — Progress Notes (Signed)
Subjective:    Patient ID: Cassandra Mcfarland, female    DOB: 1950-03-08, 65 y.o.   MRN: [id number]  HPI    Subjective:    Patient ID: Cassandra Mcfarland, female    DOB: 03/29/1950, 65 y.o.   MRN: [id number]  HPI  65 -year-old patient who is seen today for a health maintenance exam.  She has had a recent gynecologic exam and hormone replacement therapy has been discontinued  Past medical history is pertinent for a history of hyperparathyroidism.  She is status post resection.  She has hypothyroidism and has been compliant with her medications.  She has a history of dyslipidemia and has been on statin therapy in the past.  She has mild hypertension and has been on low-dose lisinopril.  Blood pressure readings generally have been in a low-normal range.  She has remote history of right total knee replacement surgery and is scheduled for elective left total knee replacement surgery.  Clinically she does well.  Remains active and has no cardiopulmonary complaints  Laboratory studies reviewed and unremarkable except for a elevated LDL cholesterol in excess of 200. Simvastatin therapy has been discontinued in the past after being well tolerated for years  Past Medical History  Diagnosis Date  . Osteoarthritis   . Thyroid disease     hyper parathyroidism  . Anxiety   . Depression   . Situational depression   . Situational anxiety 09/2003    Social History   Social History  . Marital Status: Married    Spouse Name: N/A  . Number of Children: N/A  . Years of Education: N/A   Occupational History  . Not on file.   Social History Main Topics  . Smoking status: Never Smoker   . Smokeless tobacco: Never Used  . Alcohol Use: No  . Drug Use: No  . Sexual Activity:    Partners: Male    Birth Control/ Protection: Post-menopausal   Other Topics Concern  . Not on file   Social History Narrative    Past Surgical History  Procedure Laterality Date  . Thumb fusion      Dr. Loney Loh  .  Toatal knee replacement Right 2001  . Abddominoplasty  1995  . Parathyroidectomy  5/1/112    due to hypercalcemia  . Tubal ligation  1994  . Breast reduction surgery  09/2001    & Lift  . Thumb fusion  08/2007    with bone graft- post trauma  . Appendectomy  Age 22    Family History  Problem Relation Age of Onset  . Coronary artery disease Father   . Heart disease Father   . Heart attack Father   . Coronary artery disease Brother   . Hypertension Brother   . Cancer Mother   . Leukemia Mother 69  . Osteoporosis Mother   . Hypertension Sister   . Osteoporosis Sister   . Osteoporosis Maternal Aunt   . Hypertension Brother     Allergies  Allergen Reactions  . Ciprofloxacin Other (See Comments)    Severe burning, blisters on toes  . Codeine Nausea And Vomiting  . Tetanus-Diphtheria Toxoids Td Swelling    Current Outpatient Prescriptions on File Prior to Visit  Medication Sig Dispense Refill  . ALPRAZolam (XANAX) 0.5 MG tablet TAKE 1 TABLET BY MOUTH AT BEDTIME AS NEEDED 90 tablet 1  . diclofenac (VOLTAREN) 75 MG EC tablet TAKE 1 TABLET BY MOUTH TWICE DAILY AFTER A MEAL 180 tablet 1  .  lisinopril (PRINIVIL,ZESTRIL) 10 MG tablet Take 1 tablet (10 mg total) by mouth daily. 90 tablet 3  . Multiple Vitamin (MULTIVITAMIN) capsule Take 1 capsule by mouth daily.       No current facility-administered medications on file prior to visit.    BP 120/70 mmHg  Pulse 69  Temp(Src) 98.5 F (36.9 C) (Oral)  Resp 20  Ht 5' 4.5" (1.638 m)  Wt 131 lb (59.421 kg)  BMI 22.15 kg/m2  SpO2 97%  LMP 11/10/2014 (Exact Date)          Objective:   Physical Exam  Constitutional: She is oriented to person, place, and time. She appears well-developed and well-nourished.  HENT:  Head: Normocephalic and atraumatic.  Right Ear: External ear normal.  Left Ear: External ear normal.  Mouth/Throat: Oropharynx is clear and moist.  Eyes: Conjunctivae and EOM are normal.  Neck: Normal range of  motion. Neck supple. No JVD present. No thyromegaly present.  Cardiovascular: Normal rate, regular rhythm, normal heart sounds and intact distal pulses.   No murmur heard. Pulmonary/Chest: Effort normal and breath sounds normal. She has no wheezes. She has no rales.  Abdominal: Soft. Bowel sounds are normal. She exhibits no distension and no mass. There is no tenderness. There is no rebound and no guarding.  Musculoskeletal: Normal range of motion. She exhibits no edema or tenderness.  Neurological: She is alert and oriented to person, place, and time. She has normal reflexes. No cranial nerve deficit. She exhibits normal muscle tone. Coordination normal.  Skin: Skin is warm and dry. No rash noted.  Surgical scar right knee Soft tissue swelling right lateral ankle  Psychiatric: She has a normal mood and affect. Her behavior is normal.          Assessment & Plan:   Preventive health exam    Review of Systems  Musculoskeletal:       Left knee pain       Objective:   Physical Exam  Constitutional: She is oriented to person, place, and time. She appears well-developed and well-nourished.  HENT:  Head: Normocephalic.  Right Ear: External ear normal.  Left Ear: External ear normal.  Mouth/Throat: Oropharynx is clear and moist.  Eyes: Conjunctivae and EOM are normal. Pupils are equal, round, and reactive to light.  Neck: Normal range of motion. Neck supple. No thyromegaly present.  Cardiovascular: Normal rate, regular rhythm, normal heart sounds and intact distal pulses.   Pulmonary/Chest: Effort normal and breath sounds normal.  Abdominal: Soft. Bowel sounds are normal. She exhibits no mass. There is no tenderness.  Musculoskeletal: Normal range of motion.  Surgical scar right knee  Lymphadenopathy:    She has no cervical adenopathy.  Neurological: She is alert and oriented to person, place, and time.  Skin: Skin is warm and dry. No rash noted.  Psychiatric: She has a normal  mood and affect. Her behavior is normal.          Assessment & Plan:   Preventive health exam Symptomatic left knee osteoarthritis.  No contraindications to surgery History of primary hyperparathyroidism.  Status post resection Dyslipidemia.  LDL cholesterol is greater than 200.  Will placed on Crestor 10 mg twice weekly, low-cholesterol diet.  Encouraged

## 2015-05-04 NOTE — Progress Notes (Signed)
Pre visit review using our clinic review tool, if applicable. No additional management support is needed unless otherwise documented below in the visit note. 

## 2015-05-04 NOTE — Patient Instructions (Signed)
Limit your sodium (Salt) intake  Please check your blood pressure on a regular basis.  If it is consistently greater than 150/90, please make an office appointment.    It is important that you exercise regularly, at least 20 minutes 3 to 4 times per week.  If you develop chest pain or shortness of breath seek  medical attention.  Fat and Cholesterol Restricted Diet Getting too much fat and cholesterol in your diet may cause health problems. Following this diet helps keep your fat and cholesterol at normal levels. This can keep you from getting sick. WHAT TYPES OF FAT SHOULD I CHOOSE?  Choose monosaturated and polyunsaturated fats. These are found in foods such as olive oil, canola oil, flaxseeds, walnuts, almonds, and seeds.  Eat more omega-3 fats. Good choices include salmon, mackerel, sardines, tuna, flaxseed oil, and ground flaxseeds.  Limit saturated fats. These are in animal products such as meats, butter, and cream. They can also be in plant products such as palm oil, palm kernel oil, and coconut oil.   Avoid foods with partially hydrogenated oils in them. These contain trans fats. Examples of foods that have trans fats are stick margarine, some tub margarines, cookies, crackers, and other baked goods. WHAT GENERAL GUIDELINES DO I NEED TO FOLLOW?   Check food labels. Look for the words "trans fat" and "saturated fat."  When preparing a meal:  Fill half of your plate with vegetables and green salads.  Fill one fourth of your plate with whole grains. Look for the word "whole" as the first word in the ingredient list.  Fill one fourth of your plate with lean protein foods.  Limit fruit to two servings a day. Choose fruit instead of juice.  Eat more foods with soluble fiber. Examples of foods with this type of fiber are apples, broccoli, carrots, beans, peas, and barley. Try to get 20-30 g (grams) of fiber per day.  Eat more home-cooked foods. Eat less at restaurants and  buffets.  Limit or avoid alcohol.  Limit foods high in starch and sugar.  Limit fried foods.  Cook foods without frying them. Baking, boiling, grilling, and broiling are all great options.  Lose weight if you are overweight. Losing even a small amount of weight can help your overall health. It can also help prevent diseases such as diabetes and heart disease. WHAT FOODS CAN I EAT? Grains Whole grains, such as whole wheat or whole grain breads, crackers, cereals, and pasta. Unsweetened oatmeal, bulgur, barley, quinoa, or brown rice. Corn or whole wheat flour tortillas. Vegetables Fresh or frozen vegetables (raw, steamed, roasted, or grilled). Green salads. Fruits All fresh, canned (in natural juice), or frozen fruits. Meat and Other Protein Products Ground beef (85% or leaner), grass-fed beef, or beef trimmed of fat. Skinless chicken or [place]. Ground chicken or [place]. Pork trimmed of fat. All fish and seafood. Eggs. Dried beans, peas, or lentils. Unsalted nuts or seeds. Unsalted canned or dry beans. Dairy Low-fat dairy products, such as skim or 1% milk, 2% or reduced-fat cheeses, low-fat ricotta or cottage cheese, or plain low-fat yogurt. Fats and Oils Tub margarines without trans fats. Light or reduced-fat mayonnaise and salad dressings. Avocado. Olive, canola, sesame, or safflower oils. Natural peanut or almond butter (choose ones without added sugar and oil). The items listed above may not be a complete list of recommended foods or beverages. Contact your dietitian for more options. WHAT FOODS ARE NOT RECOMMENDED? Grains White bread. White pasta. White rice. Cornbread. Bagels,  pastries, and croissants. Crackers that contain trans fat. Vegetables White potatoes. Corn. Creamed or fried vegetables. Vegetables in a cheese sauce. Fruits Dried fruits. Canned fruit in light or heavy syrup. Fruit juice. Meat and Other Protein Products Fatty cuts of meat. Ribs, chicken wings, bacon,  sausage, bologna, salami, chitterlings, fatback, hot dogs, bratwurst, and packaged luncheon meats. Liver and organ meats. Dairy Whole or 2% milk, cream, half-and-half, and cream cheese. Whole milk cheeses. Whole-fat or sweetened yogurt. Full-fat cheeses. Nondairy creamers and whipped toppings. Processed cheese, cheese spreads, or cheese curds. Sweets and Desserts Corn syrup, sugars, honey, and molasses. Candy. Jam and jelly. Syrup. Sweetened cereals. Cookies, pies, cakes, donuts, muffins, and ice cream. Fats and Oils Butter, stick margarine, lard, shortening, ghee, or bacon fat. Coconut, palm kernel, or palm oils. Beverages Alcohol. Sweetened drinks (such as sodas, lemonade, and fruit drinks or punches). The items listed above may not be a complete list of foods and beverages to avoid. Contact your dietitian for more information.   This information is not intended to replace advice given to you by your health care provider. Make sure you discuss any questions you have with your health care provider.   Document Released: 08/15/2011 Document Revised: 03/06/2014 Document Reviewed: 05/15/2013 Elsevier Interactive Patient Education 2016 [employer]. Dyslipidemia Dyslipidemia is an imbalance of the lipids in your blood. Lipids are waxy, fat-like proteins that your body needs in small amounts. Dyslipidemia often involves the lipids cholesterol or triglycerides. Common forms of dyslipidemia are:  High levels of bad cholesterol (LDL cholesterol). LDL cholesterol is the type of cholesterol that causes heart disease.  Low levels of good cholesterol (HDL cholesterol). HDL cholesterol is the type of cholesterol that helps protect against heart disease.  High levels of triglycerides. Triglycerides are a fatty substance in the blood linked to a buildup of plaque on your arteries. RISK FACTORS  Increased age.  Having a family history of high cholesterol.  Certain medicines, including birth control  pills, diuretics, beta-blockers, and some medicines for depression.  Smoking.  Eating a high-fat diet.  Being overweight.  Medical conditions such as diabetes, polycystic ovary syndrome, pregnancy, kidney disease, and hypothyroidism.  Lack of regular exercise. SIGNS AND SYMPTOMS There are no signs or symptoms with dyslipidemia. DIAGNOSIS A simple blood test called a fasting blood test can be done to determine your level of:  Total cholesterol. This is the combined number of LDL cholesterol and HDL cholesterol. A healthy number is lower than 200.  LDL cholesterol. The goal number for LDL cholesterol is different for each person depending on risk factors. Ask your health care provider what your LDL cholesterol number should be.  HDL cholesterol. A healthy level of HDL cholesterol is 60 or higher. A number lower than 40 for men or 50 for women is a danger sign.  Triglycerides. A healthy triglyceride number is less than 150. TREATMENT Dyslipidemia is a treatable condition. Your health care provider will advise you on what type of treatment is best based on your age, your test results, and current guidelines. Treatment may include:  Dietary changes. A dietitian may help you create a diet that is based on your risk factors, conditions, and lifestyle.  Regular exercise. This can help lower your LDL cholesterol, raise your HDL cholesterol, and help with weight management. Check with your health care provider before beginning an exercise program. Most people should participate in 30 minutes of brisk exercise 5 days a week.  Quitting smoking.  Medicines to lower LDL cholesterol  and triglycerides.  If you have high levels of triglycerides, your health care provider may:  Have you stop drinking alcohol.  Have you restrict your fat intake.  Have you eliminate refined sugars from your diet.  Treat you for other conditions, such as underactive thyroid gland (hypothyroidism) and high blood  sugar (hyperglycemia). Your health care provider will monitor your lipid levels with regular blood tests. HOME CARE INSTRUCTIONS  Eat a healthy diet. Follow any diet instructions if they were given to you by your health care provider.  Maintain a healthy weight.  Exercise regularly based on the recommendations of your health care provider.  Do not use any tobacco products, including cigarettes, chewing tobacco, or electronic cigarettes.  Take medicines only as directed by your health care provider.  Keep all follow-up visits as directed by your health care provider. SEEK MEDICAL CARE IF: You are having possible side effects from your medicines.   This information is not intended to replace advice given to you by your health care provider. Make sure you discuss any questions you have with your health care provider.   Document Released: 02/18/2013 Document Revised: 03/06/2014 Document Reviewed: 02/18/2013 Elsevier Interactive Patient Education [employer].

## 2015-05-20 ENCOUNTER — Other Ambulatory Visit: Payer: Self-pay | Admitting: Internal Medicine

## 2015-05-28 ENCOUNTER — Ambulatory Visit (INDEPENDENT_AMBULATORY_CARE_PROVIDER_SITE_OTHER): Payer: BLUE CROSS/BLUE SHIELD | Admitting: Internal Medicine

## 2015-05-28 ENCOUNTER — Encounter: Payer: Self-pay | Admitting: Internal Medicine

## 2015-05-28 VITALS — BP 140/80 | HR 73 | Temp 98.0°F | Resp 20 | Ht 64.5 in | Wt 134.0 lb

## 2015-05-28 DIAGNOSIS — I10 Essential (primary) hypertension: Secondary | ICD-10-CM

## 2015-05-28 DIAGNOSIS — W458XXA Other foreign body or object entering through skin, initial encounter: Secondary | ICD-10-CM | POA: Diagnosis not present

## 2015-05-28 DIAGNOSIS — S61243A Puncture wound with foreign body of left middle finger without damage to nail, initial encounter: Secondary | ICD-10-CM | POA: Diagnosis not present

## 2015-05-28 DIAGNOSIS — E785 Hyperlipidemia, unspecified: Secondary | ICD-10-CM | POA: Diagnosis not present

## 2015-05-28 DIAGNOSIS — S61223A Laceration with foreign body of left middle finger without damage to nail, initial encounter: Secondary | ICD-10-CM | POA: Insufficient documentation

## 2015-05-28 NOTE — Progress Notes (Signed)
Pre visit review using our clinic review tool, if applicable. No additional management support is needed unless otherwise documented below in the visit note. 

## 2015-05-28 NOTE — Progress Notes (Signed)
Subjective:    Patient ID: Cassandra Mcfarland, female    DOB: [birth year], 65 y.o.   MRN: [id number]  HPI  65 year old patient who has a history of hypertension and dyslipidemia. She was clean and about 1 week ago and sustained a puncture wound to the left third distal finger.  A wooden splinter was removed manually with the patient as such can develop some local swelling and some drainage.  Patient has made attempts to open the wound with a needle, but she has had a persistent foreign body sensation  Past Medical History  Diagnosis Date  . Osteoarthritis   . Thyroid disease     hyper parathyroidism  . Anxiety   . Depression   . Situational depression   . Situational anxiety 09/2003    Social History   Social History  . Marital Status: Married    Spouse Name: N/A  . Number of Children: N/A  . Years of Education: N/A   Occupational History  . Not on file.   Social History Main Topics  . Smoking status: Never Smoker   . Smokeless tobacco: Never Used  . Alcohol Use: No  . Drug Use: No  . Sexual Activity:    Partners: Male    Birth Control/ Protection: Post-menopausal   Other Topics Concern  . Not on file   Social History Narrative    Past Surgical History  Procedure Laterality Date  . Thumb fusion      Dr. Loney Loh  . Toatal knee replacement Right 2001  . Abddominoplasty  1995  . Parathyroidectomy  5/1/112    due to hypercalcemia  . Tubal ligation  1994  . Breast reduction surgery  09/2001    & Lift  . Thumb fusion  08/2007    with bone graft- post trauma  . Appendectomy  Age 65    Family History  Problem Relation Age of Onset  . Coronary artery disease Father   . Heart disease Father   . Heart attack Father   . Coronary artery disease Brother   . Hypertension Brother   . Cancer Mother   . Leukemia Mother 42  . Osteoporosis Mother   . Hypertension Sister   . Osteoporosis Sister   . Osteoporosis Maternal Aunt   . Hypertension Brother     Allergies    Allergen Reactions  . Ciprofloxacin Other (See Comments)    Severe burning, blisters on toes  . Codeine Nausea And Vomiting  . Tetanus-Diphtheria Toxoids Td Swelling    Current Outpatient Prescriptions on File Prior to Visit  Medication Sig Dispense Refill  . ALPRAZolam (XANAX) 0.5 MG tablet TAKE 1 TABLET BY MOUTH AT BEDTIME AS NEEDED 90 tablet 1  . diazepam (VALIUM) 5 MG tablet Take 1 tablet (5 mg total) by mouth daily as needed for anxiety. 90 tablet 1  . diclofenac (VOLTAREN) 75 MG EC tablet TAKE 1 TABLET BY MOUTH TWICE DAILY AFTER A MEAL 180 tablet 1  . lisinopril (PRINIVIL,ZESTRIL) 10 MG tablet TAKE 1 TABLET BY MOUTH DAILY 90 tablet 1  . Multiple Vitamin (MULTIVITAMIN) capsule Take 1 capsule by mouth daily.      . rosuvastatin (CRESTOR) 10 MG tablet 1 tablet twice weekly 60 tablet 3   No current facility-administered medications on file prior to visit.    BP 140/80 mmHg  Pulse 73  Temp(Src) 98 F (36.7 C) (Oral)  Resp 20  Ht 5' 4.5" (1.638 m)  Wt 134 lb (60.782 kg)  BMI 22.65  kg/m2  LMP 11/10/2014 (Exact Date)     Review of Systems  Constitutional: Negative.   HENT: Negative for congestion, dental problem, hearing loss, rhinorrhea, sinus pressure, sore throat and tinnitus.   Eyes: Negative for pain, discharge and visual disturbance.  Respiratory: Negative for cough and shortness of breath.   Cardiovascular: Negative for chest pain, palpitations and leg swelling.  Gastrointestinal: Negative for nausea, vomiting, abdominal pain, diarrhea, constipation, blood in stool and abdominal distention.  Genitourinary: Negative for dysuria, urgency, frequency, hematuria, flank pain, vaginal bleeding, vaginal discharge, difficulty urinating, vaginal pain and pelvic pain.  Musculoskeletal: Negative for joint swelling, arthralgias and gait problem.  Skin: Positive for wound. Negative for rash.  Neurological: Negative for dizziness, syncope, speech difficulty, weakness, numbness and  headaches.  Hematological: Negative for adenopathy.  Psychiatric/Behavioral: Negative for behavioral problems, dysphoric mood and agitation. The patient is not nervous/anxious.        Objective:   Physical Exam  Skin:  5 mm foreign body reaction, left distal third finger          Assessment & Plan:   Foreign body, left distal third finger.  After prep with alcohol and Betadine and After anesthesia with 1% Xylocaine without epinephrine, a 1 cm excision made over the foreign body reaction; blunt  exploration with forceps suggested the present of a foreign body but could not be extracted.  A second provider also attempted without success.  The wound closed with Steri-Strips and a bandage.  Plan refer hand surgery

## 2015-05-31 ENCOUNTER — Telehealth: Payer: Self-pay | Admitting: Internal Medicine

## 2015-05-31 NOTE — Telephone Encounter (Signed)
Spoke to pt, told her I faxed the form this morning. Pt said she needs to pick it up because she has not contacted them yet. Told pt okay I will put at the front desk. Pt verbalized understanding.

## 2015-05-31 NOTE — Telephone Encounter (Signed)
Pt wanted md to know the [facility] center took care of her. Please call pt once her form is completed

## 2015-06-21 ENCOUNTER — Encounter: Payer: Self-pay | Admitting: Family Medicine

## 2015-06-21 ENCOUNTER — Ambulatory Visit (INDEPENDENT_AMBULATORY_CARE_PROVIDER_SITE_OTHER): Payer: BLUE CROSS/BLUE SHIELD | Admitting: Family Medicine

## 2015-06-21 VITALS — BP 120/80 | HR 74 | Temp 97.7°F | Ht 64.5 in | Wt 133.5 lb

## 2015-06-21 DIAGNOSIS — R300 Dysuria: Secondary | ICD-10-CM

## 2015-06-21 LAB — POCT URINALYSIS DIPSTICK
BILIRUBIN UA: NEGATIVE
Glucose, UA: NEGATIVE
KETONES UA: NEGATIVE
Nitrite, UA: NEGATIVE
PH UA: 6
PROTEIN UA: NEGATIVE
RBC UA: NEGATIVE
Spec Grav, UA: 1.01
Urobilinogen, UA: 0.2

## 2015-06-21 NOTE — Progress Notes (Addendum)
HPI:  Cassandra Mcfarland is a pleasant 65 year old here for an acute visit for dysuria. She reports this started 3 days ago. Symptoms include mild frequency, urgency and mild burning after urination. No fevers, chills, nausea, vomiting, flank pain, malaise, hematuria or vaginal symptoms.  ROS: See pertinent positives and negatives per HPI.  Past Medical History  Diagnosis Date  . Osteoarthritis   . Thyroid disease     hyper parathyroidism  . Anxiety   . Depression   . Situational depression   . Situational anxiety 09/2003    Past Surgical History  Procedure Laterality Date  . Thumb fusion      Dr. Loney Loh  . Toatal knee replacement Right 2001  . Abddominoplasty  1995  . Parathyroidectomy  5/1/112    due to hypercalcemia  . Tubal ligation  1994  . Breast reduction surgery  09/2001    & Lift  . Thumb fusion  08/2007    with bone graft- post trauma  . Appendectomy  Age 46    Family History  Problem Relation Age of Onset  . Coronary artery disease Father   . Heart disease Father   . Heart attack Father   . Coronary artery disease Brother   . Hypertension Brother   . Cancer Mother   . Leukemia Mother 49  . Osteoporosis Mother   . Hypertension Sister   . Osteoporosis Sister   . Osteoporosis Maternal Aunt   . Hypertension Brother     Social History   Social History  . Marital Status: Married    Spouse Name: N/A  . Number of Children: N/A  . Years of Education: N/A   Social History Main Topics  . Smoking status: Never Smoker   . Smokeless tobacco: Never Used  . Alcohol Use: No  . Drug Use: No  . Sexual Activity:    Partners: Male    Birth Control/ Protection: Post-menopausal   Other Topics Concern  . None   Social History Narrative     Current outpatient prescriptions:  .  ALPRAZolam (XANAX) 0.5 MG tablet, TAKE 1 TABLET BY MOUTH AT BEDTIME AS NEEDED, Disp: 90 tablet, Rfl: 1 .  diazepam (VALIUM) 5 MG tablet, Take 1 tablet (5 mg total) by mouth daily as  needed for anxiety., Disp: 90 tablet, Rfl: 1 .  diclofenac (VOLTAREN) 75 MG EC tablet, TAKE 1 TABLET BY MOUTH TWICE DAILY AFTER A MEAL, Disp: 180 tablet, Rfl: 1 .  lisinopril (PRINIVIL,ZESTRIL) 10 MG tablet, TAKE 1 TABLET BY MOUTH DAILY, Disp: 90 tablet, Rfl: 1 .  Multiple Vitamin (MULTIVITAMIN) capsule, Take 1 capsule by mouth daily.  , Disp: , Rfl:  .  rosuvastatin (CRESTOR) 10 MG tablet, 1 tablet twice weekly, Disp: 60 tablet, Rfl: 3  EXAM:  Filed Vitals:   06/21/15 1430  BP: 120/80  Pulse: 74  Temp: 97.7 F (36.5 C)    Body mass index is 22.57 kg/(m^2).  GENERAL: vitals reviewed and listed above, alert, oriented, appears well hydrated and in no acute distress  HEENT: atraumatic, conjunttiva clear, no obvious abnormalities on inspection of external nose and ears  NECK: no obvious masses on inspection  LUNGS: clear to auscultation bilaterally, no wheezes, rales or rhonchi, good air movement  CV: HRRR, no peripheral edema  ABD: BS+, soft, NTTP, no CVA TTP  MS: moves all extremities without noticeable abnormality  PSYCH: pleasant and cooperative, no obvious depression or anxiety  ASSESSMENT AND PLAN:  Discussed the following assessment and plan:  Dysuria - Plan: POC Urinalysis Dipstick, Culture, Urine  -we discussed possible serious and likely etiologies, workup and treatment, treatment risks and return precautions -reviewed dip results, after this discussion, Akeela opted for symptom treatment while waiting on culture results as thinks is improving -follow up advised as needed; by phone on Friday if she has not heard from our office and is still having symptoms -of course, we advised Aneila  to return or notify a doctor immediately if symptoms worsen or persist or new concerns arise.   -Patient advised to return or notify a doctor immediately if symptoms worsen or persist or new concerns arise.  Patient Instructions  [place] of water.   Cranberry if you wish.  Azo  if needed for symptoms.  Please let us know if symptoms worsening or persist. Please call Friday if you have not heard from [place] regarding the culture.      Colin Benton R.

## 2015-06-21 NOTE — Patient Instructions (Signed)
[place] of water.   Cranberry if you wish.  Azo if needed for symptoms.  Please let us know if symptoms worsening or persist. Please call Friday if you have not heard from [place] regarding the culture.

## 2015-06-21 NOTE — Progress Notes (Signed)
Pre visit review using our clinic review tool, if applicable. No additional management support is needed unless otherwise documented below in the visit note. 

## 2015-06-23 LAB — URINE CULTURE: Colony Count: >=100000

## 2015-06-23 MED ORDER — CEPHALEXIN 500 MG PO CAPS: 500.0000 mg | ORAL_CAPSULE | Freq: Three times a day (TID) | ORAL | Status: DC

## 2015-06-23 NOTE — Addendum Note (Signed)
Addended by: Lucretia Kern on: 06/23/2015 03:02 PM   Modules accepted: Orders

## 2015-07-19 ENCOUNTER — Ambulatory Visit (INDEPENDENT_AMBULATORY_CARE_PROVIDER_SITE_OTHER): Payer: BLUE CROSS/BLUE SHIELD | Admitting: Family Medicine

## 2015-07-19 ENCOUNTER — Telehealth: Payer: Self-pay | Admitting: Internal Medicine

## 2015-07-19 ENCOUNTER — Encounter: Payer: Self-pay | Admitting: Family Medicine

## 2015-07-19 VITALS — BP 122/80 | HR 76 | Temp 97.9°F | Ht 64.5 in | Wt 132.7 lb

## 2015-07-19 DIAGNOSIS — W57XXXA Bitten or stung by nonvenomous insect and other nonvenomous arthropods, initial encounter: Secondary | ICD-10-CM

## 2015-07-19 DIAGNOSIS — S00461A Insect bite (nonvenomous) of right ear, initial encounter: Secondary | ICD-10-CM | POA: Diagnosis not present

## 2015-07-19 MED ORDER — DOXYCYCLINE HYCLATE 100 MG PO TABS: ORAL_TABLET | ORAL | Status: DC

## 2015-07-19 NOTE — Progress Notes (Signed)
Subjective:    Patient ID: Cassandra Mcfarland, female    DOB: 01/01/1951, 65 y.o.   MRN: [id number]  HPI  Ms. Cassandra Mcfarland is a 65 year old female who present today after a tick bite on her right ear that she noticed approximately 2 days ago. She suspects that the tick has been present for 7 days due to itching and redness that have been present. Pertinent history of clearing brush and riding horses 7 days prior is noted.  Associated symptoms of itching and edema in right ear occurred which led her to further investigate if a tick was present.  After husband and daughter looked further, a tick was noticed in the right ear and was removed via tweezers two days ago. Tick including head and legs were removed and symptoms of edema, redness, and itching are improving. Associated symptoms of post nasal drip, ear pressure/pain, are present today. She denies fever, chills, sweats, rash, or muscle, joint, or bone pain. She has an upcoming knee replacement surgery that is scheduled in 2 weeks. Treatment at home included cleaning area with soap and water and she reports taking 2 Keflex that she had in her home. She reports that symptoms are improving.  Review of Systems  Constitutional: Negative for fever, chills and fatigue.  HENT: Positive for postnasal drip. Negative for congestion and rhinorrhea.        Ear pressure  Respiratory: Negative for cough and shortness of breath.   Cardiovascular: Negative for chest pain, palpitations and leg swelling.  Gastrointestinal: Negative for nausea, vomiting, abdominal pain and diarrhea.  Musculoskeletal: Negative for myalgias, joint swelling and arthralgias.  Skin: Negative for color change and rash.  Neurological: Negative for dizziness, light-headedness and headaches.   Past Medical History  Diagnosis Date  . Osteoarthritis   . Thyroid disease     hyper parathyroidism  . Anxiety   . Depression   . Situational depression   . Situational anxiety 09/2003     Social  History   Social History  . Marital Status: Married    Spouse Name: N/A  . Number of Children: N/A  . Years of Education: N/A   Occupational History  . Not on file.   Social History Main Topics  . Smoking status: Never Smoker   . Smokeless tobacco: Never Used  . Alcohol Use: No  . Drug Use: No  . Sexual Activity:    Partners: Male    Birth Control/ Protection: Post-menopausal   Other Topics Concern  . Not on file   Social History Narrative    Past Surgical History  Procedure Laterality Date  . Thumb fusion      Dr. Loney Loh  . Toatal knee replacement Right 2001  . Abddominoplasty  1995  . Parathyroidectomy  5/1/112    due to hypercalcemia  . Tubal ligation  1994  . Breast reduction surgery  09/2001    & Lift  . Thumb fusion  08/2007    with bone graft- post trauma  . Appendectomy  Age 64    Family History  Problem Relation Age of Onset  . Coronary artery disease Father   . Heart disease Father   . Heart attack Father   . Coronary artery disease Brother   . Hypertension Brother   . Cancer Mother   . Leukemia Mother 9  . Osteoporosis Mother   . Hypertension Sister   . Osteoporosis Sister   . Osteoporosis Maternal Aunt   . Hypertension Brother  Allergies  Allergen Reactions  . Ciprofloxacin Other (See Comments)    Severe burning, blisters on toes  . Codeine Nausea And Vomiting  . Tetanus-Diphtheria Toxoids Td Swelling    Current Outpatient Prescriptions on File Prior to Visit  Medication Sig Dispense Refill  . acetaminophen (TYLENOL) 500 MG tablet Take 1,000 mg by mouth every 6 (six) hours as needed (For pain.).    Marland Kitchen ALPRAZolam (XANAX) 0.5 MG tablet TAKE 1 TABLET BY MOUTH AT BEDTIME AS NEEDED (Patient taking differently: TAKE 1 TABLET BY MOUTH AT BEDTIME AS NEEDED FOR ANXIETY.) 90 tablet 1  . diazepam (VALIUM) 5 MG tablet Take 1 tablet (5 mg total) by mouth daily as needed for anxiety. 90 tablet 1  . diclofenac (VOLTAREN) 75 MG EC tablet TAKE 1  TABLET BY MOUTH TWICE DAILY AFTER A MEAL 180 tablet 1  . lisinopril (PRINIVIL,ZESTRIL) 10 MG tablet TAKE 1 TABLET BY MOUTH DAILY 90 tablet 1  . Multiple Vitamin (MULTIVITAMIN) capsule Take 1 capsule by mouth daily.      . Probiotic Product (PROBIOTIC DAILY PO) Take 1 capsule by mouth at bedtime.    . rosuvastatin (CRESTOR) 10 MG tablet 1 tablet twice weekly (Patient taking differently: Take 10 mg by mouth 2 (two) times a week. Take on Wednesdays and Saturdays.) 60 tablet 3   No current facility-administered medications on file prior to visit.    BP 122/80 mmHg  Pulse 76  Temp(Src) 97.9 F (36.6 C) (Oral)  Ht 5' 4.5" (1.638 m)  Wt 132 lb 11.2 oz (60.192 kg)  BMI 22.43 kg/m2  SpO2 95%  LMP 11/10/2014 (Exact Date)        Objective:   Physical Exam  Constitutional: She is oriented to person, place, and time. She appears well-developed and well-nourished.  HENT:  TMs dull bilaterally. Minimal redness and edema noted in right auricle. Inspected external and internal structures of ear which are WNL in appearance.   Eyes: Pupils are equal, round, and reactive to light. No scleral icterus.  Neck: Neck supple.  Cardiovascular: Normal rate, regular rhythm and normal heart sounds.   Pulmonary/Chest: Effort normal and breath sounds normal.  Lymphadenopathy:    She has no cervical adenopathy.  Neurological: She is alert and oriented to person, place, and time.  Skin: Skin is warm and dry. No rash noted.  Psychiatric: She has a normal mood and affect. Her behavior is normal. Judgment and thought content normal.       Assessment & Plan:  1. Tick bite of right ear, initial encounter Prophylaxis treatment provided following a tick bite that is suspected of being attached >36 hours and she presents today within 72 hours of removal. Advised patient if she develops fever, rash, tender, swollen lymph nodes, or symptoms that do not improve or worsen, to follow up with PCP for further evaluation  and treatment. - doxycycline (VIBRA-TABS) 100 MG tablet; Take 200 mg as a single dose.  Dispense: 2 tablet; Refill: 0  Delano Metz, FNP-C

## 2015-07-19 NOTE — Telephone Encounter (Signed)
Pt has made appt with Gregary Signs today

## 2015-07-19 NOTE — Telephone Encounter (Signed)
Patient Name: Cassandra Mcfarland  DOB: 06-05-1950    Initial Comment found tick on her ear last night, its been there for a week, did get the tick out, lymph node on neck is swollen, neck is a bit sore, has knee surgery next week, wants to know if she should be on any antibiotics    Nurse Assessment  Nurse: Leilani Merl, RN, Heather Date/Time ([place] Time): 07/19/2015 9:15:28 AM  Confirm and document reason for call. If symptomatic, describe symptoms. You must click the next button to save text entered. ---Caller states that she found tick on her ear last night, its been there for a week, did get the tick out, lymph node on neck is swollen, neck is a bit sore, has knee surgery next week, wants to know if she should be on any antibiotics  Has the patient traveled out of the country within the last 30 days? ---Not Applicable  Does the patient have any new or worsening symptoms? ---Yes  Will a triage be completed? ---Yes  Related visit to physician within the last 2 weeks? ---No  Does the PT have any chronic conditions? (i.e. diabetes, asthma, etc.) ---Unknown  Is this a behavioral health or substance abuse call? ---No     Guidelines    Guideline Title Affirmed Question Affirmed Notes  Tick Bite [1] Red or very tender (to touch) area AND [2] started over 24 hours after the bite    Final Disposition User   See Physician within 24 Hours [place], RN, Water quality scientist    Comments  Made appt with Almira Coaster for today at 9:45 am.   Referrals  REFERRED TO PCP OFFICE   Disagree/Comply: Comply

## 2015-07-19 NOTE — Patient Instructions (Signed)
Tick Bite Information Ticks are insects that attach themselves to the skin and draw blood for food. There are various types of ticks. Common types include wood ticks and deer ticks. Most ticks live in shrubs and grassy areas. Ticks can climb onto your body when you make contact with leaves or grass where the tick is waiting. The most common places on the body for ticks to attach themselves are the scalp, neck, armpits, waist, and groin. Most tick bites are harmless, but sometimes ticks carry germs that cause diseases. These germs can be spread to a person during the tick's feeding process. The chance of a disease spreading through a tick bite depends on:   The type of tick.  Time of year.   How long the tick is attached.   Geographic location.  HOW CAN YOU PREVENT TICK BITES? Take these steps to help prevent tick bites when you are outdoors:  Wear protective clothing. Long sleeves and long pants are best.   Wear white clothes so you can see ticks more easily.  Tuck your pant legs into your socks.   If walking on a trail, stay in the middle of the trail to avoid brushing against bushes.  Avoid walking through areas with long grass.  Put insect repellent on all exposed skin and along boot tops, pant legs, and sleeve cuffs.   Check clothing, hair, and skin repeatedly and before going inside.   Brush off any ticks that are not attached.  Take a shower or bath as soon as possible after being outdoors.  WHAT IS THE PROPER WAY TO REMOVE A TICK? Ticks should be removed as soon as possible to help prevent diseases caused by tick bites. 1. If latex gloves are available, put them on before trying to remove a tick.  2. Using fine-point tweezers, grasp the tick as close to the skin as possible. You may also use curved forceps or a tick removal tool. Grasp the tick as close to its head as possible. Avoid grasping the tick on its body. 3. Pull gently with steady upward pressure until  the tick lets go. Do not twist the tick or jerk it suddenly. This may break off the tick's head or mouth parts. 4. Do not squeeze or crush the tick's body. This could force disease-carrying fluids from the tick into your body.  5. After the tick is removed, wash the bite area and your hands with soap and water or other disinfectant such as alcohol. 6. Apply a small amount of antiseptic cream or ointment to the bite site.  7. Wash and disinfect any instruments that were used.  Do not try to remove a tick by applying a hot match, petroleum jelly, or fingernail polish to the tick. These methods do not work and may increase the chances of disease being spread from the tick bite.  WHEN SHOULD YOU SEEK MEDICAL CARE? Contact your health care provider if you are unable to remove a tick from your skin or if a part of the tick breaks off and is stuck in the skin.  After a tick bite, you need to be aware of signs and symptoms that could be related to diseases spread by ticks. Contact your health care provider if you develop any of the following in the days or weeks after the tick bite:  Unexplained fever.  Rash. A circular rash that appears days or weeks after the tick bite may indicate the possibility of Lyme disease. The rash may resemble   a target with a bull's-eye and may occur at a different part of your body than the tick bite.  Redness and swelling in the area of the tick bite.   Tender, swollen lymph glands.   Diarrhea.   Weight loss.   Cough.   Fatigue.   Muscle, joint, or bone pain.   Abdominal pain.   Headache.   Lethargy or a change in your level of consciousness.  Difficulty walking or moving your legs.   Numbness in the legs.   Paralysis.  Shortness of breath.   Confusion.   Repeated vomiting.    This information is not intended to replace advice given to you by your health care provider. Make sure you discuss any questions you have with your health  care provider.   Document Released: 02/11/2000 Document Revised: 03/06/2014 Document Reviewed: 07/24/2012 Elsevier Interactive Patient Education 2016 [employer] Inc[employer]  

## 2015-07-19 NOTE — Progress Notes (Signed)
Pre visit review using our clinic tool,if applicable. No additional management support is needed unless otherwise documented below in the visit note.  

## 2015-07-22 ENCOUNTER — Ambulatory Visit: Payer: Self-pay | Admitting: Orthopedic Surgery

## 2015-07-22 NOTE — Progress Notes (Signed)
Preoperative surgical orders have been place into the Epic hospital system for Cassandra Mcfarland on 07/22/2015, 1:05 PM  by Mickel Crow for surgery on 08-02-15.  Preop Total Knee orders including Experal, IV Tylenol, and IV Decadron as long as there are no contraindications to the above medications. [name], PA-C

## 2015-07-23 ENCOUNTER — Encounter: Payer: Self-pay | Admitting: Family Medicine

## 2015-07-23 ENCOUNTER — Ambulatory Visit (INDEPENDENT_AMBULATORY_CARE_PROVIDER_SITE_OTHER): Payer: BLUE CROSS/BLUE SHIELD | Admitting: Family Medicine

## 2015-07-23 VITALS — BP 112/82 | HR 79 | Temp 98.0°F | Resp 12 | Wt 131.2 lb

## 2015-07-23 DIAGNOSIS — L089 Local infection of the skin and subcutaneous tissue, unspecified: Secondary | ICD-10-CM

## 2015-07-23 DIAGNOSIS — S00461D Insect bite (nonvenomous) of right ear, subsequent encounter: Secondary | ICD-10-CM | POA: Diagnosis not present

## 2015-07-23 DIAGNOSIS — W57XXXD Bitten or stung by nonvenomous insect and other nonvenomous arthropods, subsequent encounter: Secondary | ICD-10-CM

## 2015-07-23 MED ORDER — DOXYCYCLINE HYCLATE 100 MG PO TABS: 100.0000 mg | ORAL_TABLET | Freq: Two times a day (BID) | ORAL | Status: AC

## 2015-07-23 NOTE — Patient Instructions (Signed)
A few things to remember from today's visit:   1. Tick bite of ear, right, subsequent encounter   2. Soft tissue infection     - doxycycline (VIBRA-TABS) 100 MG tablet; Take 1 tablet (100 mg total) by mouth 2 (two) times daily.  Dispense: 14 tablet; Refill: 0    Avoid scratching. Topical Rx steroid cream, small amount and for no mor than 2 weeks. Keep area clean. Monitor for worsening symptoms.    I have seen you today for an acute visit because your primary care provider was not available. Monitor for signs of worsening symptoms and seek immediate medical attention if any concerning symptom as we discussed. If symptoms are not resolved in 1-2 weeks you should schedule a follow up appointment.  Please continue following with your PCP for your other chronic medical problems.

## 2015-07-23 NOTE — Progress Notes (Signed)
Subjective:    Patient ID: CIOMARA KORNACKI, female    DOB: [birth year], 65 y.o.   MRN: [id number]  HPI   Ms. VESTAL DELVAL is a 65 y.o.female here today concerned about right earlobe erythema and intense pruritus + soreness. She was seen on 07/19/15, after  2 days of right ear rash attributed to tick bite. Husband found and removed tick from right ear lobe on [id number] and she is certain she got it a week before while she was walking in the woods. She received prophylactic treatment for Lyme disease with Doxycycline and seemed like symptoms were improving until 2-3 days ago she she noted pain, "very sore" and pruritus, the latter one is worse today. She removed a "piece of something" this morning, not sure if it is part of the tick, brought it with her.  She is concerned because she is having TKR 07/29/15 and does not want a complication that delay surgery.  Tick was imbedded, not engorged. + Erythema and edema of right ear lobe. + Right cervical adenopathies. + Arthralgias, no more than usual. Denies fever, chills, myalgia, oral lesions, dysphagia, cough,wheezing, abdominal pain, nausea, vomiting, changes in bowel habits..  Denies severe/frequent headache, visual changes, chest pain, dyspnea, palpitation, claudication, focal weakness, or edema.    Review of Systems  Constitutional: Negative for fever, chills, appetite change and fatigue.  HENT: Negative for congestion, ear pain, mouth sores, sneezing, sore throat, trouble swallowing and voice change.   Eyes: Negative for pain and redness.  Respiratory: Negative for cough, shortness of breath and wheezing.   Cardiovascular: Negative for chest pain and leg swelling.  Gastrointestinal: Negative for nausea, vomiting, abdominal pain and diarrhea.  Musculoskeletal: Positive for arthralgias (no more than usual). Negative for joint swelling, gait problem and neck pain.  Skin: Positive for rash. Negative for wound.  Neurological: Negative for  weakness, numbness and headaches.  Hematological: Positive for adenopathy (cervical). Does not bruise/bleed easily.  Psychiatric/Behavioral: Negative for confusion and sleep disturbance. The patient is nervous/anxious.      Current Outpatient Prescriptions on File Prior to Visit  Medication Sig Dispense Refill  . acetaminophen (TYLENOL) 500 MG tablet Take 1,000 mg by mouth every 6 (six) hours as needed (For pain.).    Marland Kitchen ALPRAZolam (XANAX) 0.5 MG tablet TAKE 1 TABLET BY MOUTH AT BEDTIME AS NEEDED (Patient taking differently: TAKE 1 TABLET BY MOUTH AT BEDTIME AS NEEDED FOR ANXIETY.) 90 tablet 1  . diazepam (VALIUM) 5 MG tablet Take 1 tablet (5 mg total) by mouth daily as needed for anxiety. 90 tablet 1  . diclofenac (VOLTAREN) 75 MG EC tablet TAKE 1 TABLET BY MOUTH TWICE DAILY AFTER A MEAL 180 tablet 1  . lisinopril (PRINIVIL,ZESTRIL) 10 MG tablet TAKE 1 TABLET BY MOUTH DAILY 90 tablet 1  . Multiple Vitamin (MULTIVITAMIN) capsule Take 1 capsule by mouth daily.      . Probiotic Product (PROBIOTIC DAILY PO) Take 1 capsule by mouth at bedtime.    . rosuvastatin (CRESTOR) 10 MG tablet 1 tablet twice weekly (Patient taking differently: Take 10 mg by mouth 2 (two) times a week. Take on Wednesdays and Saturdays.) 60 tablet 3   No current facility-administered medications on file prior to visit.     Past Medical History  Diagnosis Date  . Osteoarthritis   . Thyroid disease     hyper parathyroidism  . Anxiety   . Depression   . Situational depression   . Situational anxiety 09/2003  Social History   Social History  . Marital Status: Married    Spouse Name: N/A  . Number of Children: N/A  . Years of Education: N/A   Social History Main Topics  . Smoking status: Never Smoker   . Smokeless tobacco: Never Used  . Alcohol Use: No  . Drug Use: No  . Sexual Activity:    Partners: Male    Birth Control/ Protection: Post-menopausal   Other Topics Concern  . None   Social History  Narrative    Filed Vitals:   07/23/15 1307  BP: 112/82  Pulse: 79  Temp: 98 F (36.7 C)  Resp: 12   Body mass index is 22.18 kg/(m^2).      Objective:   Physical Exam  Constitutional: She is oriented to person, place, and time. She appears well-developed and well-nourished. No distress.  HENT:  Head: Atraumatic.    Right Ear: Hearing, tympanic membrane and ear canal normal.  Mouth/Throat: Uvula is midline, oropharynx is clear and moist and mucous membranes are normal. No oropharyngeal exudate or posterior oropharyngeal edema.  Eyes: Conjunctivae and EOM are normal.  Pulmonary/Chest: Effort normal and breath sounds normal. No respiratory distress. She has no wheezes. She has no rales.  Musculoskeletal: Normal range of motion. She exhibits no edema or tenderness.  Lymphadenopathy:       Head (right side): Submandibular and posterior auricular adenopathy present. No submental and no occipital adenopathy present.       Head (left side): No submental and no submandibular adenopathy present.    She has cervical adenopathy.       Right cervical: Superficial cervical and posterior cervical adenopathy present.  Lymphadenopathy about 1 cm, mildly tender.  Neurological: She is alert and oriented to person, place, and time. She has normal strength. Gait normal.  Skin: Skin is warm. Rash noted. There is erythema.  Right earlobe erythema and mild edema, tender when earlobe pull but no with palpation. Area where tick was found (triangular fossa) also tender, no fluctuant area or drainage appreciated. Also mild erythema noted on right mandibular area, no induration or tenderness. See HENT.  Psychiatric: Her mood appears anxious.  Well groomed, good eye contact.  Nursing note and vitals reviewed.      Assessment & Plan:    Raylin was seen today for follow-up.  Diagnoses and all orders for this visit:  Tick bite of ear, right, subsequent encounter -     doxycycline (VIBRA-TABS) 100  MG tablet; Take 1 tablet (100 mg total) by mouth 2 (two) times daily.  Soft tissue infection   -What she removed from ear and brought today seem to be a brown crust, 1 mm, it is not a tick. We discussed symptoms and risk factors of Lyme disease, we decided to hold on ab test today but is rash gets worse or she noted other suggestive symptom she will let me know, so lab can be done.  -During OV she was frequently.manipulating/scratching right mandibular area and ear lobe, using finger nail to reach deep into triangular fossa right ear. Explained that findings today could also be cause by local irritation due insect/tick bite and frequent scratching. Because she is planning on having surgery + re-occuring soreness and findings today I recommended abx treatment, Doxycycline x 7 days. Avoid manipulation. Topical steroid may also help, she states that she has Rx at home, not sure about name (husband's Rx) as well as Hydroxyzine 25 mg. Recommended topical steroid bid for up to 2  weeks and Hydroxyzine 25 mg at bedtime.Side effects of both meds discussed. Keep area clean with soap and water. Clearly instructed about warning signs, she voices understanding.  F/U as needed.        Betty G. Martinique, MD  [facility]. [place] office.

## 2015-07-27 ENCOUNTER — Encounter (HOSPITAL_COMMUNITY): Payer: Self-pay

## 2015-07-27 ENCOUNTER — Encounter (HOSPITAL_COMMUNITY)
Admission: RE | Admit: 2015-07-27 | Discharge: 2015-07-27 | Disposition: A | Discharge status: Home / Self Care | Payer: BLUE CROSS/BLUE SHIELD | Source: Ambulatory Visit | Attending: Orthopedic Surgery | Admitting: Orthopedic Surgery

## 2015-07-27 DIAGNOSIS — I10 Essential (primary) hypertension: Secondary | ICD-10-CM | POA: Diagnosis not present

## 2015-07-27 DIAGNOSIS — Z01812 Encounter for preprocedural laboratory examination: Secondary | ICD-10-CM | POA: Diagnosis present

## 2015-07-27 DIAGNOSIS — Z01810 Encounter for preprocedural cardiovascular examination: Secondary | ICD-10-CM | POA: Diagnosis present

## 2015-07-27 DIAGNOSIS — M1712 Unilateral primary osteoarthritis, left knee: Secondary | ICD-10-CM | POA: Insufficient documentation

## 2015-07-27 HISTORY — DX: Other complications of anesthesia, initial encounter: T88.59XA

## 2015-07-27 HISTORY — DX: Essential (primary) hypertension: I10

## 2015-07-27 HISTORY — DX: Nausea with vomiting, unspecified: R11.2

## 2015-07-27 HISTORY — DX: Other specified postprocedural states: Z98.890

## 2015-07-27 HISTORY — DX: Adverse effect of unspecified anesthetic, initial encounter: T41.45XA

## 2015-07-27 HISTORY — DX: Personal history of urinary (tract) infections: Z87.440

## 2015-07-27 LAB — CBC
HCT: 38.3 % (ref 36.0–46.0)
Hemoglobin: 12.6 g/dL (ref 12.0–15.0)
MCH: 30.2 pg (ref 26.0–34.0)
MCHC: 32.9 g/dL (ref 30.0–36.0)
MCV: 91.8 fL (ref 78.0–100.0)
Platelets: 207 10*3/uL (ref 150–400)
RBC: 4.17 MIL/uL (ref 3.87–5.11)
RDW: 13.3 % (ref 11.5–15.5)
WBC: 5.7 10*3/uL (ref 4.0–10.5)

## 2015-07-27 LAB — URINALYSIS, ROUTINE W REFLEX MICROSCOPIC
BILIRUBIN URINE: NEGATIVE
Glucose, UA: NEGATIVE mg/dL
HGB URINE DIPSTICK: NEGATIVE
Ketones, ur: NEGATIVE mg/dL
Leukocytes, UA: NEGATIVE
NITRITE: NEGATIVE
PH: 7 (ref 5.0–8.0)
Protein, ur: NEGATIVE mg/dL
SPECIFIC GRAVITY, URINE: 1.007 (ref 1.005–1.030)

## 2015-07-27 LAB — COMPREHENSIVE METABOLIC PANEL
ALT: 28 U/L (ref 14–54)
ANION GAP: 8 (ref 5–15)
AST: 31 U/L (ref 15–41)
Albumin: 4.6 g/dL (ref 3.5–5.0)
Alkaline Phosphatase: 56 U/L (ref 38–126)
BILIRUBIN TOTAL: 0.6 mg/dL (ref 0.3–1.2)
BUN: 26 mg/dL — ABNORMAL HIGH (ref 6–20)
CO2: 26 mmol/L (ref 22–32)
Calcium: 9.8 mg/dL (ref 8.9–10.3)
Chloride: 103 mmol/L (ref 101–111)
Creatinine, Ser: 0.93 mg/dL (ref 0.44–1.00)
Glucose, Bld: 95 mg/dL (ref 65–99)
POTASSIUM: 5.1 mmol/L (ref 3.5–5.1)
Sodium: 137 mmol/L (ref 135–145)
TOTAL PROTEIN: 7.3 g/dL (ref 6.5–8.1)

## 2015-07-27 LAB — PROTIME-INR
INR: 0.96 (ref 0.00–1.49)
PROTHROMBIN TIME: 13 s (ref 11.6–15.2)

## 2015-07-27 LAB — APTT: APTT: 30 s (ref 24–37)

## 2015-07-27 LAB — SURGICAL PCR SCREEN
MRSA, PCR: NEGATIVE
STAPHYLOCOCCUS AUREUS: NEGATIVE

## 2015-07-27 LAB — ABO/RH: ABO/RH(D): A POS

## 2015-07-27 NOTE — Progress Notes (Signed)
CMP results in epic per PAT visit 07/27/2015 sent to Dr Wynelle Link

## 2015-07-27 NOTE — Patient Instructions (Signed)
Cassandra Mcfarland  07/27/2015   Your procedure is scheduled on: Monday August 02, 2015  Report to [facility]  Entrance take [place]  elevators to 3rd floor to  [place] at 7:30 AM.  Call this number if you have problems the morning of surgery [phone]   Remember: ONLY 1 PERSON MAY GO WITH YOU TO SHORT STAY TO GET  READY MORNING OF Cassandra Mcfarland.  Do not eat food or drink liquids :After Midnight.     Take these medicines the morning of surgery with A SIP OF WATER: NONE                                You may not have any metal on your body including hair pins and              piercings  Do not wear jewelry, make-up, lotions, powders or perfumes, deodorant             Do not wear nail polish.  Do not shave  48 hours prior to surgery.              Men may shave face and neck.   Do not bring valuables to the hospital. [place].  Contacts, dentures or bridgework may not be worn into surgery.  Leave suitcase in the car. After surgery it may be brought to your room.                Please read over the following fact sheets you were given:MRSA INFORMATION SHEET; INCENTIVE SPIROMETER; BLOOD TRANSFUSION INFORMATION SHEET  _____________________________________________________________________             [facility] - Preparing for Surgery Before surgery, you can play an important role.  Because skin is not sterile, your skin needs to be as free of germs as possible.  You can reduce the number of germs on your skin by washing with CHG (chlorahexidine gluconate) soap before surgery.  CHG is an antiseptic cleaner which kills germs and bonds with the skin to continue killing germs even after washing. Please DO NOT use if you have an allergy to CHG or antibacterial soaps.  If your skin becomes reddened/irritated stop using the CHG and inform your nurse when you arrive at Short Stay. Do not shave (including legs and  underarms) for at least 48 hours prior to the first CHG shower.  You may shave your face/neck. Please follow these instructions carefully:  1.  Shower with CHG Soap the night before surgery and the  morning of Surgery.  2.  If you choose to wash your hair, wash your hair first as usual with your  normal  shampoo.  3.  After you shampoo, rinse your hair and body thoroughly to remove the  shampoo.                           4.  Use CHG as you would any other liquid soap.  You can apply chg directly  to the skin and wash                       Gently with a scrungie  or clean washcloth.  5.  Apply the CHG Soap to your body ONLY FROM THE NECK DOWN.   Do not use on face/ open                           Wound or open sores. Avoid contact with eyes, ears mouth and genitals (private parts).                       Wash face,  Genitals (private parts) with your normal soap.             6.  Wash thoroughly, paying special attention to the area where your surgery  will be performed.  7.  Thoroughly rinse your body with warm water from the neck down.  8.  DO NOT shower/wash with your normal soap after using and rinsing off  the CHG Soap.                9.  Pat yourself dry with a clean towel.            10.  Wear clean pajamas.            11.  Place clean sheets on your bed the night of your first shower and do not  sleep with pets. Day of Surgery : Do not apply any lotions/deodorants the morning of surgery.  Please wear clean clothes to the hospital/surgery center.  FAILURE TO FOLLOW THESE INSTRUCTIONS MAY RESULT IN THE CANCELLATION OF YOUR SURGERY PATIENT SIGNATURE_________________________________  NURSE SIGNATURE__________________________________  ________________________________________________________________________   Cassandra Mcfarland  An incentive spirometer is a tool that can help keep your lungs clear and active. This tool measures how well you are filling your lungs with each breath. Taking  long deep breaths may help reverse or decrease the chance of developing breathing (pulmonary) problems (especially infection) following:  A long period of time when you are unable to move or be active. BEFORE THE PROCEDURE   If the spirometer includes an indicator to show your best effort, your nurse or respiratory therapist will set it to a desired goal.  If possible, sit up straight or lean slightly forward. Try not to slouch.  Hold the incentive spirometer in an upright position. INSTRUCTIONS FOR USE   Sit on the edge of your bed if possible, or sit up as far as you can in bed or on a chair.  Hold the incentive spirometer in an upright position.  Breathe out normally.  Place the mouthpiece in your mouth and seal your lips tightly around it.  Breathe in slowly and as deeply as possible, raising the piston or the ball toward the top of the column.  Hold your breath for 3-5 seconds or for as long as possible. Allow the piston or ball to fall to the bottom of the column.  Remove the mouthpiece from your mouth and breathe out normally.  Rest for a few seconds and repeat Steps 1 through 7 at least 10 times every 1-2 hours when you are awake. Take your time and take a few normal breaths between deep breaths.  The spirometer may include an indicator to show your best effort. Use the indicator as a goal to work toward during each repetition.  After each set of 10 deep breaths, practice coughing to be sure your lungs are clear. If you have an incision (the cut made at the time of surgery), support your incision when  coughing by placing a pillow or rolled up towels firmly against it. Once you are able to get out of bed, walk around indoors and cough well. You may stop using the incentive spirometer when instructed by your caregiver.  RISKS AND COMPLICATIONS  Take your time so you do not get dizzy or light-headed.  If you are in pain, you may need to take or ask for pain medication before  doing incentive spirometry. It is harder to take a deep breath if you are having pain. AFTER USE  Rest and breathe slowly and easily.  It can be helpful to keep track of a log of your progress. Your caregiver can provide you with a simple table to help with this. If you are using the spirometer at home, follow these instructions: [place] IF:   You are having difficultly using the spirometer.  You have trouble using the spirometer as often as instructed.  Your pain medication is not giving enough relief while using the spirometer.  You develop fever of 100.5 F (38.1 C) or higher. SEEK IMMEDIATE MEDICAL CARE IF:   You cough up bloody sputum that had not been present before.  You develop fever of 102 F (38.9 C) or greater.  You develop worsening pain at or near the incision site. MAKE SURE YOU:   Understand these instructions.  Will watch your condition.  Will get help right away if you are not doing well or get worse. Document Released: 06/26/2006 Document Revised: 05/08/2011 Document Reviewed: 08/27/2006 ExitCare Patient Information 2014 ExitCare, [place].   ________________________________________________________________________  WHAT IS A BLOOD TRANSFUSION? Blood Transfusion Information  A transfusion is the replacement of blood or some of its parts. Blood is made up of multiple cells which provide different functions.  Red blood cells carry oxygen and are used for blood loss replacement.  White blood cells fight against infection.  Platelets control bleeding.  Plasma helps clot blood.  Other blood products are available for specialized needs, such as hemophilia or other clotting disorders. BEFORE THE TRANSFUSION  Who gives blood for transfusions?   Healthy volunteers who are fully evaluated to make sure their blood is safe. This is blood bank blood. Transfusion therapy is the safest it has ever been in the practice of medicine. Before blood is taken  from a donor, a complete history is taken to make sure that person has no history of diseases nor engages in risky social behavior (examples are intravenous drug use or sexual activity with multiple partners). The donor's travel history is screened to minimize risk of transmitting infections, such as malaria. The donated blood is tested for signs of infectious diseases, such as HIV and hepatitis. The blood is then tested to be sure it is compatible with you in order to minimize the chance of a transfusion reaction. If you or a relative donates blood, this is often done in anticipation of surgery and is not appropriate for emergency situations. It takes many days to process the donated blood. RISKS AND COMPLICATIONS Although transfusion therapy is very safe and saves many lives, the main dangers of transfusion include:   Getting an infectious disease.  Developing a transfusion reaction. This is an allergic reaction to something in the blood you were given. Every precaution is taken to prevent this. The decision to have a blood transfusion has been considered carefully by your caregiver before blood is given. Blood is not given unless the benefits outweigh the risks. AFTER THE TRANSFUSION  Right after receiving  a blood transfusion, you will usually feel much better and more energetic. This is especially true if your red blood cells have gotten low (anemic). The transfusion raises the level of the red blood cells which carry oxygen, and this usually causes an energy increase.  The nurse administering the transfusion will monitor you carefully for complications. HOME CARE INSTRUCTIONS  No special instructions are needed after a transfusion. You may find your energy is better. Speak with your caregiver about any limitations on activity for underlying diseases you may have. SEEK MEDICAL CARE IF:   Your condition is not improving after your transfusion.  You develop redness or irritation at the  intravenous (IV) site. SEEK IMMEDIATE MEDICAL CARE IF:  Any of the following symptoms occur over the next 12 hours:  Shaking chills.  You have a temperature by mouth above 102 F (38.9 C), not controlled by medicine.  Chest, back, or muscle pain.  People around you feel you are not acting correctly or are confused.  Shortness of breath or difficulty breathing.  Dizziness and fainting.  You get a rash or develop hives.  You have a decrease in urine output.  Your urine turns a dark color or changes to pink, red, or brown. Any of the following symptoms occur over the next 10 days:  You have a temperature by mouth above 102 F (38.9 C), not controlled by medicine.  Shortness of breath.  Weakness after normal activity.  The white part of the eye turns yellow (jaundice).  You have a decrease in the amount of urine or are urinating less often.  Your urine turns a dark color or changes to pink, red, or brown. Document Released: 02/11/2000 Document Revised: 05/08/2011 Document Reviewed: 09/30/2007 [facility] Patient Information 2014 [place], [place].  _______________________________________________________________________

## 2015-08-01 ENCOUNTER — Ambulatory Visit: Payer: Self-pay | Admitting: Orthopedic Surgery

## 2015-08-01 NOTE — H&P (Signed)
Cassandra Mcfarland DOB: 11/25/1950 Married / Language: English / Race: White Female Date of Admission: 08/02/2015 CC:  Left Knee Pain History of Present Illness The patient is a 65 year old female who comes in for a preoperative History and Physical. The patient is scheduled for a left total knee arthroplasty to be performed by Dr. Dione Plover. Aluisio, MD at [facility] on 08/02/2015. The patient is a 65 year old female who is 16 years out from her right total knee arthroplasty. The patient states that she is doing very well other than a little soreness over the patella tendon at times. The pain is under excellent control at this time and describe their pain as mild. The patient is also being followed for their left knee pain and osteoarthritis. They are now year(s) out from when symptoms began. Symptoms reported include: pain (medial side pain), aching, pain with weightbearing, difficulty arising from a kneeling position and difficulty arising from chair. The patient feels that they are doing poorly and report their pain level to be moderate. Current treatment includes: NSAIDs. No previous surgeries or injections in the left knee. She has pain with activity and at rest. She would like to get set up to have the left knee replaced. Radiographs of both knees and lateral showed prosthesis on the right remains in excellent position. No periprosthetic abnormalities. There is no wear or osteolysis. Her left knee shows bone-on-bone arthritis in the medial and patellofemoral compartments with slight varus deformity. At this point, the most predictable means of improving pain and function is total knee arthroplasty. The procedure, risks, potential complications and rehab course are discussed in detail and the patient elects to proceed. She has got advanced end-stage arthritis of the left knee with progressive pain and dysfunction. She is at a stage where she would like to go ahead and get this fixed.  We discussed  the differences now compared to 16 years ago. She is ready to go ahead and proceed. They have been treated conservatively in the past for the above stated problem and despite conservative measures, they continue to have progressive pain and severe functional limitations and dysfunction. They have failed non-operative management including home exercise, medications, and injections. It is felt that they would benefit from undergoing total joint replacement. Risks and benefits of the procedure have been discussed with the patient and they elect to proceed with surgery. There are no active contraindications to surgery such as ongoing infection or rapidly progressive neurological disease.   Problem List/Past Medical Problems Reconciled  S/P Right total knee arthroplasty (V43.65)  Impingement Syndrome (726.2)  Complete Rotator Cuff Rupture, non-traumatic (727.61)  Aftercare following right knee joint replacement surgery (Z47.1)  Primary osteoarthritis of left knee (M17.12)  Rotator Cuff Impingement Syndrome (M75.40)  Osteoarthritis, knee (M17.10)  High blood pressure  Hypercholesterolemia  Osteoarthritis  Allergies  Cipro *FLUOROQUINOLONES*  Blisters Codeine Sulfate *ANALGESICS - OPIOID*  Vomiting, Itching. True Codeine Tetanus Toxoid Adsorbed *TOXOIDS*  Swelling Allergies Reconciled   Family History Cancer  Mother. mother Chronic Obstructive Lung Disease  sister Heart Disease  Brother, Father. father and brother Osteoporosis  Sister. Heart disease in female family member before age 58  Hypertension  sister  Social History Alcohol use  current drinker; drinks wine; less than 5 per week Children  2 Current drinker  08/05/2013: Currently drinks wine only occasionally per week Current work status  retired Engineer, agricultural (Currently)  no Exercise  Exercises weekly; does other and gym / [employer] Exercises  weekly; does individual sport and gym / weights Living  situation  live with spouse Marital status  married Illicit drug use  no No history of drug/alcohol rehab  Not under pain contract  Number of flights of stairs before winded  greater than 5 Pain Contract  no Tobacco / smoke exposure  08/05/2013: no no Previously in rehab  no Tobacco use  Never smoker. 08/05/2013 never smoker  Medication History Xanax (0.5MG  Tablet, Oral QHS sleep) Active. Lisinopril (10MG  Tablet, Oral daily) Active. Diclofenac Sodium (75MG  Tablet DR, Oral BID) Active. Crestor (10MG  Tablet, Oral twice a week) Active.  Past Surgical History Appendectomy  Arthroscopy of Knee  right Mammoplasty; Reduction  bilateral Arthroscopy of Shoulder  left Rotator Cuff Repair  left Other Surgery  thumb fusion Total Knee Replacement  right Tubal Ligation  Left Long Finger I&D - Removal of Foreign Body     Review of Systems General Not Present- Chills, Fatigue, Fever, Memory Loss, Night Sweats, Weight Gain and Weight Loss. Skin Not Present- Eczema, Hives, Itching, Lesions and Rash. HEENT Not Present- Dentures, Double Vision, Headache, Hearing Loss, Tinnitus and Visual Loss. Respiratory Not Present- Allergies, Chronic Cough, Coughing up blood, Shortness of breath at rest and Shortness of breath with exertion. Cardiovascular Not Present- Chest Pain, Difficulty Breathing Lying Down, Murmur, Palpitations, Racing/skipping heartbeats and Swelling. Gastrointestinal Not Present- Abdominal Pain, Bloody Stool, Constipation, Diarrhea, Difficulty Swallowing, Heartburn, Jaundice, Loss of appetitie, Nausea and Vomiting. Female Genitourinary Not Present- Blood in Urine, Discharge, Flank Pain, Incontinence, Painful Urination, Urgency, Urinary frequency, Urinary Retention, Urinating at Night and Weak urinary stream. Musculoskeletal Present- Joint Pain. Not Present- Back Pain, Joint Swelling, Morning Stiffness, Muscle Pain, Muscle Weakness and Spasms. Neurological Not  Present- Blackout spells, Difficulty with balance, Dizziness, Paralysis, Tremor and Weakness. Psychiatric Not Present- Insomnia.  Vitals  Weight: 130 lb Height: 65in Body Surface Area: 1.65 m Body Mass Index: 21.63 kg/m  Pulse: 72 (Regular)  BP: 128/62 (Sitting, Right Arm, Standard)  Physical Exam  General Mental Status -Alert, cooperative and good historian. General Appearance-pleasant, Not in acute distress. Orientation-Oriented X3. Build & Nutrition-Well nourished and Well developed.  Head and Neck Head-normocephalic, atraumatic . Neck Global Assessment - supple, no bruit auscultated on the right, no bruit auscultated on the left.  Eye Pupil - Bilateral-Regular and Round. Motion - Bilateral-EOMI.  Chest and Lung Exam Auscultation Breath sounds - clear at anterior chest wall and clear at posterior chest wall. Adventitious sounds - No Adventitious sounds.  Cardiovascular Auscultation Rhythm - Regular rate and rhythm. Heart Sounds - S1 WNL and S2 WNL. Murmurs & Other Heart Sounds - Auscultation of the heart reveals - No Murmurs.  Abdomen Palpation/Percussion Tenderness - Abdomen is non-tender to palpation. Rigidity (guarding) - Abdomen is soft. Auscultation Auscultation of the abdomen reveals - Bowel sounds normal.  Female Genitourinary Note: Not done, not pertinent to present illness   Musculoskeletal Note: A well-developed female in no distress. Right knee shows no swelling. Range is 0 to 130 with no tenderness or instability. Left knee varus deformity. Range about 5 to 120. Marked crepitus in range of motion, tenderness medial greater than lateral, no instability noted. Pulse, sensation, and motor intact.  RADIOGRAPHS Radiographs of both knees and lateral showed prosthesis on the right remains in excellent position. No periprosthetic abnormalities. There is no wear or osteolysis. Her left knee shows bone-on-bone arthritis in the medial and  patellofemoral compartments with slight varus deformity.  Assessment & Plan  Primary osteoarthritis of left knee (M17.12)  Note:Surgical Plans: Left Total Knee Replacement  Disposition: Home  PCP: Dr. Raliegh Ip - Patient has been seen preoperatively and felt to be stable for surgery.  IV TXA  Anesthesia Issues: Nausea with anesthesia  Signed electronically by Ok Edwards, III PA-C

## 2015-08-02 ENCOUNTER — Encounter (HOSPITAL_COMMUNITY): Payer: Self-pay | Admitting: *Deleted

## 2015-08-02 ENCOUNTER — Encounter (HOSPITAL_COMMUNITY)
Admission: RE | Disposition: A | Discharge status: Home / Self Care | Payer: Self-pay | Source: Ambulatory Visit | Attending: Orthopedic Surgery

## 2015-08-02 ENCOUNTER — Inpatient Hospital Stay (HOSPITAL_COMMUNITY): Payer: BLUE CROSS/BLUE SHIELD | Admitting: Anesthesiology

## 2015-08-02 ENCOUNTER — Inpatient Hospital Stay (HOSPITAL_COMMUNITY)
Admission: RE | Admit: 2015-08-02 | Discharge: 2015-08-04 | DRG: 470 | Disposition: A | Discharge status: Home / Self Care | Payer: BLUE CROSS/BLUE SHIELD | Source: Ambulatory Visit | Attending: Orthopedic Surgery | Admitting: Orthopedic Surgery

## 2015-08-02 DIAGNOSIS — E7800 Pure hypercholesterolemia, unspecified: Secondary | ICD-10-CM | POA: Diagnosis present

## 2015-08-02 DIAGNOSIS — Z825 Family history of asthma and other chronic lower respiratory diseases: Secondary | ICD-10-CM

## 2015-08-02 DIAGNOSIS — Z809 Family history of malignant neoplasm, unspecified: Secondary | ICD-10-CM | POA: Diagnosis not present

## 2015-08-02 DIAGNOSIS — Z8249 Family history of ischemic heart disease and other diseases of the circulatory system: Secondary | ICD-10-CM

## 2015-08-02 DIAGNOSIS — Z8262 Family history of osteoporosis: Secondary | ICD-10-CM | POA: Diagnosis not present

## 2015-08-02 DIAGNOSIS — M1710 Unilateral primary osteoarthritis, unspecified knee: Secondary | ICD-10-CM | POA: Diagnosis present

## 2015-08-02 DIAGNOSIS — M1712 Unilateral primary osteoarthritis, left knee: Principal | ICD-10-CM | POA: Diagnosis present

## 2015-08-02 DIAGNOSIS — M179 Osteoarthritis of knee, unspecified: Secondary | ICD-10-CM | POA: Diagnosis present

## 2015-08-02 DIAGNOSIS — Z87440 Personal history of urinary (tract) infections: Secondary | ICD-10-CM

## 2015-08-02 DIAGNOSIS — I10 Essential (primary) hypertension: Secondary | ICD-10-CM | POA: Diagnosis present

## 2015-08-02 DIAGNOSIS — Z96651 Presence of right artificial knee joint: Secondary | ICD-10-CM | POA: Diagnosis present

## 2015-08-02 DIAGNOSIS — M25562 Pain in left knee: Secondary | ICD-10-CM | POA: Diagnosis present

## 2015-08-02 HISTORY — PX: TOTAL KNEE ARTHROPLASTY: SHX125

## 2015-08-02 SURGERY — ARTHROPLASTY, KNEE, TOTAL
Anesthesia: Monitor Anesthesia Care | Site: Knee | Laterality: Left

## 2015-08-02 MED ORDER — CEFAZOLIN SODIUM-DEXTROSE 2-4 GM/100ML-% IV SOLN
2.0000 g | INTRAVENOUS | Status: AC
  Administered 2015-08-02: 2 g via INTRAVENOUS
  Filled 2015-08-02: qty 100

## 2015-08-02 MED ORDER — DIAZEPAM 5 MG PO TABS: 5.0000 mg | ORAL_TABLET | Freq: Every day | ORAL | Status: DC | PRN

## 2015-08-02 MED ORDER — DEXAMETHASONE SODIUM PHOSPHATE 10 MG/ML IJ SOLN
INTRAMUSCULAR | Status: AC
  Filled 2015-08-02: qty 1

## 2015-08-02 MED ORDER — DIPHENHYDRAMINE HCL 12.5 MG/5ML PO ELIX: 12.5000 mg | ORAL_SOLUTION | ORAL | Status: DC | PRN

## 2015-08-02 MED ORDER — PROMETHAZINE HCL 25 MG/ML IJ SOLN: 6.2500 mg | INTRAMUSCULAR | Status: DC | PRN

## 2015-08-02 MED ORDER — ACETAMINOPHEN 10 MG/ML IV SOLN
1000.0000 mg | Freq: Once | INTRAVENOUS | Status: AC
  Administered 2015-08-02: 1000 mg via INTRAVENOUS

## 2015-08-02 MED ORDER — HYDROMORPHONE HCL 1 MG/ML IJ SOLN
0.5000 mg | INTRAMUSCULAR | Status: DC | PRN
  Administered 2015-08-02: 0.5 mg via INTRAVENOUS
  Filled 2015-08-02: qty 1

## 2015-08-02 MED ORDER — BISACODYL 10 MG RE SUPP: 10.0000 mg | Freq: Every day | RECTAL | Status: DC | PRN

## 2015-08-02 MED ORDER — FENTANYL CITRATE (PF) 100 MCG/2ML IJ SOLN
INTRAMUSCULAR | Status: AC
  Filled 2015-08-02: qty 2

## 2015-08-02 MED ORDER — BUPIVACAINE HCL 0.25 % IJ SOLN
INTRAMUSCULAR | Status: DC | PRN
  Administered 2015-08-02: 20 mL

## 2015-08-02 MED ORDER — HYDROMORPHONE HCL 1 MG/ML IJ SOLN: 0.2500 mg | INTRAMUSCULAR | Status: DC | PRN

## 2015-08-02 MED ORDER — BUPIVACAINE IN DEXTROSE 0.75-8.25 % IT SOLN
INTRATHECAL | Status: DC | PRN
  Administered 2015-08-02: 1.6 mL via INTRATHECAL

## 2015-08-02 MED ORDER — PROPOFOL 500 MG/50ML IV EMUL
INTRAVENOUS | Status: DC | PRN
  Administered 2015-08-02: 50 ug/kg/min via INTRAVENOUS

## 2015-08-02 MED ORDER — DEXTROSE 5 % IV SOLN
10.0000 mg | INTRAVENOUS | Status: DC | PRN
  Administered 2015-08-02: 40 ug/min via INTRAVENOUS

## 2015-08-02 MED ORDER — BUPIVACAINE LIPOSOME 1.3 % IJ SUSP
INTRAMUSCULAR | Status: DC | PRN
  Administered 2015-08-02: 20 mL

## 2015-08-02 MED ORDER — ACETAMINOPHEN 10 MG/ML IV SOLN
INTRAVENOUS | Status: AC
  Filled 2015-08-02: qty 100

## 2015-08-02 MED ORDER — METOCLOPRAMIDE HCL 10 MG PO TABS: 5.0000 mg | ORAL_TABLET | Freq: Three times a day (TID) | ORAL | Status: DC | PRN

## 2015-08-02 MED ORDER — TRANEXAMIC ACID 1000 MG/10ML IV SOLN
1000.0000 mg | INTRAVENOUS | Status: AC
  Administered 2015-08-02: 1000 mg via INTRAVENOUS
  Filled 2015-08-02: qty 10

## 2015-08-02 MED ORDER — PHENYLEPHRINE HCL 10 MG/ML IJ SOLN
INTRAMUSCULAR | Status: AC
  Filled 2015-08-02: qty 1

## 2015-08-02 MED ORDER — BUPIVACAINE LIPOSOME 1.3 % IJ SUSP
20.0000 mL | Freq: Once | INTRAMUSCULAR | Status: DC
  Filled 2015-08-02: qty 20

## 2015-08-02 MED ORDER — ACETAMINOPHEN 500 MG PO TABS
1000.0000 mg | ORAL_TABLET | Freq: Four times a day (QID) | ORAL | Status: AC
  Administered 2015-08-02 – 2015-08-03 (×4): 1000 mg via ORAL
  Filled 2015-08-02 (×4): qty 2

## 2015-08-02 MED ORDER — CEFAZOLIN SODIUM-DEXTROSE 2-4 GM/100ML-% IV SOLN
2.0000 g | Freq: Four times a day (QID) | INTRAVENOUS | Status: AC
  Administered 2015-08-02 (×2): 2 g via INTRAVENOUS
  Filled 2015-08-02 (×2): qty 100

## 2015-08-02 MED ORDER — ACETAMINOPHEN 325 MG PO TABS
650.0000 mg | ORAL_TABLET | Freq: Four times a day (QID) | ORAL | Status: DC | PRN
  Administered 2015-08-03: 650 mg via ORAL
  Filled 2015-08-02 (×3): qty 2

## 2015-08-02 MED ORDER — DEXAMETHASONE SODIUM PHOSPHATE 10 MG/ML IJ SOLN
10.0000 mg | Freq: Once | INTRAMUSCULAR | Status: AC
  Administered 2015-08-02: 10 mg via INTRAVENOUS

## 2015-08-02 MED ORDER — PROPOFOL 10 MG/ML IV BOLUS
INTRAVENOUS | Status: AC
  Filled 2015-08-02: qty 40

## 2015-08-02 MED ORDER — CEFAZOLIN SODIUM-DEXTROSE 2-4 GM/100ML-% IV SOLN
INTRAVENOUS | Status: AC
  Filled 2015-08-02: qty 100

## 2015-08-02 MED ORDER — OXYCODONE HCL 5 MG/5ML PO SOLN: 5.0000 mg | Freq: Once | ORAL | Status: DC | PRN

## 2015-08-02 MED ORDER — MIDAZOLAM HCL 2 MG/2ML IJ SOLN
INTRAMUSCULAR | Status: AC
  Filled 2015-08-02: qty 2

## 2015-08-02 MED ORDER — MENTHOL 3 MG MT LOZG: 1.0000 | LOZENGE | OROMUCOSAL | Status: DC | PRN

## 2015-08-02 MED ORDER — SODIUM CHLORIDE 0.9 % IR SOLN
Status: DC | PRN
  Administered 2015-08-02: 1000 mL

## 2015-08-02 MED ORDER — MIDAZOLAM HCL 5 MG/5ML IJ SOLN
INTRAMUSCULAR | Status: DC | PRN
  Administered 2015-08-02: 2 mg via INTRAVENOUS

## 2015-08-02 MED ORDER — DEXAMETHASONE SODIUM PHOSPHATE 10 MG/ML IJ SOLN
10.0000 mg | Freq: Once | INTRAMUSCULAR | Status: AC
  Administered 2015-08-03: 10 mg via INTRAVENOUS
  Filled 2015-08-02: qty 1

## 2015-08-02 MED ORDER — PROPOFOL 10 MG/ML IV BOLUS
INTRAVENOUS | Status: AC
Start: 2015-08-02 — End: 2015-08-02
  Filled 2015-08-02: qty 20

## 2015-08-02 MED ORDER — FLEET ENEMA 7-19 GM/118ML RE ENEM: 1.0000 | ENEMA | Freq: Once | RECTAL | Status: DC | PRN

## 2015-08-02 MED ORDER — SODIUM CHLORIDE 0.9 % IV SOLN
INTRAVENOUS | Status: DC
  Administered 2015-08-02 – 2015-08-03 (×2): via INTRAVENOUS

## 2015-08-02 MED ORDER — DOCUSATE SODIUM 100 MG PO CAPS
100.0000 mg | ORAL_CAPSULE | Freq: Two times a day (BID) | ORAL | Status: DC
  Administered 2015-08-02 – 2015-08-04 (×4): 100 mg via ORAL
  Filled 2015-08-02 (×7): qty 1

## 2015-08-02 MED ORDER — TRAMADOL HCL 50 MG PO TABS
50.0000 mg | ORAL_TABLET | Freq: Four times a day (QID) | ORAL | Status: DC | PRN
  Administered 2015-08-04 (×2): 50 mg via ORAL
  Filled 2015-08-02 (×2): qty 1

## 2015-08-02 MED ORDER — TRANEXAMIC ACID 1000 MG/10ML IV SOLN
1000.0000 mg | Freq: Once | INTRAVENOUS | Status: AC
  Administered 2015-08-02: 1000 mg via INTRAVENOUS
  Filled 2015-08-02: qty 10

## 2015-08-02 MED ORDER — ALPRAZOLAM 0.5 MG PO TABS
0.5000 mg | ORAL_TABLET | Freq: Every evening | ORAL | Status: DC | PRN
  Administered 2015-08-02: 0.5 mg via ORAL
  Filled 2015-08-02: qty 1

## 2015-08-02 MED ORDER — ONDANSETRON HCL 4 MG/2ML IJ SOLN
INTRAMUSCULAR | Status: DC | PRN
  Administered 2015-08-02: 4 mg via INTRAVENOUS

## 2015-08-02 MED ORDER — PHENOL 1.4 % MT LIQD: 1.0000 | OROMUCOSAL | Status: DC | PRN

## 2015-08-02 MED ORDER — OXYCODONE HCL 5 MG PO TABS: 5.0000 mg | ORAL_TABLET | Freq: Once | ORAL | Status: DC | PRN

## 2015-08-02 MED ORDER — PROPOFOL 10 MG/ML IV BOLUS
INTRAVENOUS | Status: DC | PRN
  Administered 2015-08-02: 40 mg via INTRAVENOUS

## 2015-08-02 MED ORDER — ONDANSETRON HCL 4 MG/2ML IJ SOLN
4.0000 mg | Freq: Four times a day (QID) | INTRAMUSCULAR | Status: DC | PRN
  Administered 2015-08-02: 4 mg via INTRAVENOUS
  Filled 2015-08-02: qty 2

## 2015-08-02 MED ORDER — SODIUM CHLORIDE 0.9 % IV SOLN: INTRAVENOUS | Status: DC

## 2015-08-02 MED ORDER — LACTATED RINGERS IV SOLN
INTRAVENOUS | Status: DC
  Administered 2015-08-02: 09:00:00 via INTRAVENOUS

## 2015-08-02 MED ORDER — METOCLOPRAMIDE HCL 5 MG/ML IJ SOLN: 5.0000 mg | Freq: Three times a day (TID) | INTRAMUSCULAR | Status: DC | PRN

## 2015-08-02 MED ORDER — SODIUM CHLORIDE 0.9 % IJ SOLN
INTRAMUSCULAR | Status: DC | PRN
  Administered 2015-08-02: 30 mL

## 2015-08-02 MED ORDER — METHOCARBAMOL 500 MG PO TABS
500.0000 mg | ORAL_TABLET | Freq: Four times a day (QID) | ORAL | Status: DC | PRN
  Administered 2015-08-03 – 2015-08-04 (×4): 500 mg via ORAL
  Filled 2015-08-02 (×6): qty 1

## 2015-08-02 MED ORDER — FENTANYL CITRATE (PF) 100 MCG/2ML IJ SOLN
INTRAMUSCULAR | Status: DC | PRN
  Administered 2015-08-02: 50 ug via INTRAVENOUS

## 2015-08-02 MED ORDER — METHOCARBAMOL 1000 MG/10ML IJ SOLN
500.0000 mg | Freq: Four times a day (QID) | INTRAVENOUS | Status: DC | PRN
  Administered 2015-08-02: 500 mg via INTRAVENOUS
  Filled 2015-08-02 (×2): qty 5

## 2015-08-02 MED ORDER — CHLORHEXIDINE GLUCONATE 4 % EX LIQD: 60.0000 mL | Freq: Once | CUTANEOUS | Status: DC

## 2015-08-02 MED ORDER — ONDANSETRON HCL 4 MG PO TABS: 4.0000 mg | ORAL_TABLET | Freq: Four times a day (QID) | ORAL | Status: DC | PRN

## 2015-08-02 MED ORDER — ONDANSETRON HCL 4 MG/2ML IJ SOLN
INTRAMUSCULAR | Status: AC
  Filled 2015-08-02: qty 2

## 2015-08-02 MED ORDER — ACETAMINOPHEN 650 MG RE SUPP: 650.0000 mg | Freq: Four times a day (QID) | RECTAL | Status: DC | PRN

## 2015-08-02 MED ORDER — POLYETHYLENE GLYCOL 3350 17 G PO PACK: 17.0000 g | PACK | Freq: Every day | ORAL | Status: DC | PRN

## 2015-08-02 MED ORDER — RIVAROXABAN 10 MG PO TABS
10.0000 mg | ORAL_TABLET | Freq: Every day | ORAL | Status: DC
  Administered 2015-08-03 – 2015-08-04 (×2): 10 mg via ORAL
  Filled 2015-08-02 (×3): qty 1

## 2015-08-02 MED ORDER — HYDROMORPHONE HCL 2 MG PO TABS
2.0000 mg | ORAL_TABLET | ORAL | Status: DC | PRN
  Administered 2015-08-02 – 2015-08-03 (×3): 2 mg via ORAL
  Administered 2015-08-03: 4 mg via ORAL
  Administered 2015-08-03 (×4): 2 mg via ORAL
  Administered 2015-08-04: 4 mg via ORAL
  Administered 2015-08-04: 2 mg via ORAL
  Filled 2015-08-02 (×2): qty 1
  Filled 2015-08-02 (×4): qty 2
  Filled 2015-08-02 (×3): qty 1

## 2015-08-02 MED ORDER — PHENYLEPHRINE HCL 10 MG/ML IJ SOLN
INTRAMUSCULAR | Status: DC | PRN
  Administered 2015-08-02: 40 ug via INTRAVENOUS
  Administered 2015-08-02: 80 ug via INTRAVENOUS

## 2015-08-02 MED ORDER — PHENYLEPHRINE 40 MCG/ML (10ML) SYRINGE FOR IV PUSH (FOR BLOOD PRESSURE SUPPORT)
PREFILLED_SYRINGE | INTRAVENOUS | Status: AC
  Filled 2015-08-02: qty 10

## 2015-08-02 SURGICAL SUPPLY — 52 items
BAG DECANTER FOR FLEXI CONT (MISCELLANEOUS) ×2 IMPLANT
BAG SPEC THK2 15X12 ZIP CLS (MISCELLANEOUS) ×1
BAG ZIPLOCK 12X15 (MISCELLANEOUS) ×2 IMPLANT
BANDAGE ACE 6X5 VEL STRL LF (GAUZE/BANDAGES/DRESSINGS) ×1 IMPLANT
BANDAGE ELASTIC 6 VELCRO ST LF (GAUZE/BANDAGES/DRESSINGS) ×1 IMPLANT
BLADE SAG 18X100X1.27 (BLADE) ×2 IMPLANT
BLADE SAW SGTL 11.0X1.19X90.0M (BLADE) ×2 IMPLANT
BOWL SMART MIX CTS (DISPOSABLE) ×1 IMPLANT
CAPT KNEE TOTAL 3 ATTUNE ×1 IMPLANT
CEMENT HV SMART SET (Cement) ×4 IMPLANT
CLOTH BEACON ORANGE TIMEOUT ST (SAFETY) ×2 IMPLANT
CUFF TOURN SGL QUICK 34 (TOURNIQUET CUFF) ×2
CUFF TRNQT CYL 34X4X40X1 (TOURNIQUET CUFF) ×1 IMPLANT
DECANTER SPIKE VIAL GLASS SM (MISCELLANEOUS) ×2 IMPLANT
DRAPE U-SHAPE 47X51 STRL (DRAPES) ×2 IMPLANT
DRSG ADAPTIC 3X8 NADH LF (GAUZE/BANDAGES/DRESSINGS) ×2 IMPLANT
DRSG PAD ABDOMINAL 8X10 ST (GAUZE/BANDAGES/DRESSINGS) ×1 IMPLANT
DURAPREP 26ML APPLICATOR (WOUND CARE) ×2 IMPLANT
ELECT REM PT RETURN 9FT ADLT (ELECTROSURGICAL) ×2
ELECTRODE REM PT RTRN 9FT ADLT (ELECTROSURGICAL) ×1 IMPLANT
EVACUATOR 1/8 PVC DRAIN (DRAIN) ×2 IMPLANT
GAUZE SPONGE 4X4 12PLY STRL (GAUZE/BANDAGES/DRESSINGS) ×2 IMPLANT
GLOVE BIO SURGEON STRL SZ7.5 (GLOVE) IMPLANT
GLOVE BIO SURGEON STRL SZ8 (GLOVE) ×2 IMPLANT
GLOVE BIOGEL PI IND STRL 6.5 (GLOVE) IMPLANT
GLOVE BIOGEL PI IND STRL 8 (GLOVE) ×1 IMPLANT
GLOVE BIOGEL PI INDICATOR 6.5 (GLOVE)
GLOVE BIOGEL PI INDICATOR 8 (GLOVE) ×1
GLOVE SURG SS PI 6.5 STRL IVOR (GLOVE) IMPLANT
GOWN STRL REUS W/TWL LRG LVL3 (GOWN DISPOSABLE) ×2 IMPLANT
GOWN STRL REUS W/TWL XL LVL3 (GOWN DISPOSABLE) IMPLANT
HANDPIECE INTERPULSE COAX TIP (DISPOSABLE) ×2
IMMOBILIZER KNEE 20 (SOFTGOODS) ×1 IMPLANT
IMMOBILIZER KNEE 20 THIGH 36 (SOFTGOODS) ×1 IMPLANT
MANIFOLD NEPTUNE II (INSTRUMENTS) ×2 IMPLANT
NS IRRIG 1000ML POUR BTL (IV SOLUTION) ×2 IMPLANT
PACK TOTAL KNEE CUSTOM (KITS) ×2 IMPLANT
PAD ABD 8X10 STRL (GAUZE/BANDAGES/DRESSINGS) ×1 IMPLANT
PADDING CAST COTTON 6X4 STRL (CAST SUPPLIES) ×2 IMPLANT
POSITIONER SURGICAL ARM (MISCELLANEOUS) ×2 IMPLANT
SET HNDPC FAN SPRY TIP SCT (DISPOSABLE) ×1 IMPLANT
STRIP CLOSURE SKIN 1/2X4 (GAUZE/BANDAGES/DRESSINGS) ×3 IMPLANT
SUT MNCRL AB 4-0 PS2 18 (SUTURE) ×2 IMPLANT
SUT VIC AB 2-0 CT1 27 (SUTURE) ×6
SUT VIC AB 2-0 CT1 TAPERPNT 27 (SUTURE) ×3 IMPLANT
SUT VLOC 180 0 24IN GS25 (SUTURE) ×2 IMPLANT
SYR 50ML LL SCALE MARK (SYRINGE) ×2 IMPLANT
TRAY FOLEY W/METER SILVER 14FR (SET/KITS/TRAYS/PACK) ×1 IMPLANT
TRAY FOLEY W/METER SILVER 16FR (SET/KITS/TRAYS/PACK) ×2 IMPLANT
WATER STERILE IRR 1500ML POUR (IV SOLUTION) ×2 IMPLANT
WRAP KNEE MAXI GEL POST OP (GAUZE/BANDAGES/DRESSINGS) ×2 IMPLANT
YANKAUER SUCT BULB TIP 10FT TU (MISCELLANEOUS) ×2 IMPLANT

## 2015-08-02 NOTE — H&P (View-Only) (Signed)
Cassandra Mcfarland DOB: [birth year] Married / Language: English / Race: White Female Date of Admission: 08/02/2015 CC:  Left Knee Pain History of Present Illness The patient is a 65 year old female who comes in for a preoperative History and Physical. The patient is scheduled for a left total knee arthroplasty to be performed by Dr. Dione Plover. Aluisio, MD at [facility] on 08/02/2015. The patient is a 64 year old female who is 16 years out from her right total knee arthroplasty. The patient states that she is doing very well other than a little soreness over the patella tendon at times. The pain is under excellent control at this time and describe their pain as mild. The patient is also being followed for their left knee pain and osteoarthritis. They are now year(s) out from when symptoms began. Symptoms reported include: pain (medial side pain), aching, pain with weightbearing, difficulty arising from a kneeling position and difficulty arising from chair. The patient feels that they are doing poorly and report their pain level to be moderate. Current treatment includes: NSAIDs. No previous surgeries or injections in the left knee. She has pain with activity and at rest. She would like to get set up to have the left knee replaced. Radiographs of both knees and lateral showed prosthesis on the right remains in excellent position. No periprosthetic abnormalities. There is no wear or osteolysis. Her left knee shows bone-on-bone arthritis in the medial and patellofemoral compartments with slight varus deformity. At this point, the most predictable means of improving pain and function is total knee arthroplasty. The procedure, risks, potential complications and rehab course are discussed in detail and the patient elects to proceed. She has got advanced end-stage arthritis of the left knee with progressive pain and dysfunction. She is at a stage where she would like to go ahead and get this fixed.  We discussed  the differences now compared to 16 years ago. She is ready to go ahead and proceed. They have been treated conservatively in the past for the above stated problem and despite conservative measures, they continue to have progressive pain and severe functional limitations and dysfunction. They have failed non-operative management including home exercise, medications, and injections. It is felt that they would benefit from undergoing total joint replacement. Risks and benefits of the procedure have been discussed with the patient and they elect to proceed with surgery. There are no active contraindications to surgery such as ongoing infection or rapidly progressive neurological disease.   Problem List/Past Medical Problems Reconciled  S/P Right total knee arthroplasty (V43.65)  Impingement Syndrome (726.2)  Complete Rotator Cuff Rupture, non-traumatic (727.61)  Aftercare following right knee joint replacement surgery (Z47.1)  Primary osteoarthritis of left knee (M17.12)  Rotator Cuff Impingement Syndrome (M75.40)  Osteoarthritis, knee (M17.10)  High blood pressure  Hypercholesterolemia  Osteoarthritis  Allergies  Cipro *FLUOROQUINOLONES*  Blisters Codeine Sulfate *ANALGESICS - OPIOID*  Vomiting, Itching. True Codeine Tetanus Toxoid Adsorbed *TOXOIDS*  Swelling Allergies Reconciled   Family History Cancer  Mother. mother Chronic Obstructive Lung Disease  sister Heart Disease  Brother, Father. father and brother Osteoporosis  Sister. Heart disease in female family member before age 97  Hypertension  sister  Social History Alcohol use  current drinker; drinks wine; less than 5 per week Children  2 Current drinker  08/05/2013: Currently drinks wine only occasionally per week Current work status  retired Engineer, agricultural (Currently)  no Exercise  Exercises weekly; does other and gym / [employer] Exercises  weekly; does individual sport and gym / weights Living  situation  live with spouse Marital status  married Illicit drug use  no No history of drug/alcohol rehab  Not under pain contract  Number of flights of stairs before winded  greater than 5 Pain Contract  no Tobacco / smoke exposure  08/05/2013: no no Previously in rehab  no Tobacco use  Never smoker. 08/05/2013 never smoker  Medication History Xanax (0.5MG  Tablet, Oral QHS sleep) Active. Lisinopril (10MG  Tablet, Oral daily) Active. Diclofenac Sodium (75MG  Tablet DR, Oral BID) Active. Crestor (10MG  Tablet, Oral twice a week) Active.  Past Surgical History Appendectomy  Arthroscopy of Knee  right Mammoplasty; Reduction  bilateral Arthroscopy of Shoulder  left Rotator Cuff Repair  left Other Surgery  thumb fusion Total Knee Replacement  right Tubal Ligation  Left Long Finger I&D - Removal of Foreign Body     Review of Systems General Not Present- Chills, Fatigue, Fever, Memory Loss, Night Sweats, Weight Gain and Weight Loss. Skin Not Present- Eczema, Hives, Itching, Lesions and Rash. HEENT Not Present- Dentures, Double Vision, Headache, Hearing Loss, Tinnitus and Visual Loss. Respiratory Not Present- Allergies, Chronic Cough, Coughing up blood, Shortness of breath at rest and Shortness of breath with exertion. Cardiovascular Not Present- Chest Pain, Difficulty Breathing Lying Down, Murmur, Palpitations, Racing/skipping heartbeats and Swelling. Gastrointestinal Not Present- Abdominal Pain, Bloody Stool, Constipation, Diarrhea, Difficulty Swallowing, Heartburn, Jaundice, Loss of appetitie, Nausea and Vomiting. Female Genitourinary Not Present- Blood in Urine, Discharge, Flank Pain, Incontinence, Painful Urination, Urgency, Urinary frequency, Urinary Retention, Urinating at Night and Weak urinary stream. Musculoskeletal Present- Joint Pain. Not Present- Back Pain, Joint Swelling, Morning Stiffness, Muscle Pain, Muscle Weakness and Spasms. Neurological Not  Present- Blackout spells, Difficulty with balance, Dizziness, Paralysis, Tremor and Weakness. Psychiatric Not Present- Insomnia.  Vitals  Weight: 130 lb Height: 65in Body Surface Area: 1.65 m Body Mass Index: 21.63 kg/m  Pulse: 72 (Regular)  BP: 128/62 (Sitting, Right Arm, Standard)  Physical Exam  General Mental Status -Alert, cooperative and good historian. General Appearance-pleasant, Not in acute distress. Orientation-Oriented X3. Build & Nutrition-Well nourished and Well developed.  Head and Neck Head-normocephalic, atraumatic . Neck Global Assessment - supple, no bruit auscultated on the right, no bruit auscultated on the left.  Eye Pupil - Bilateral-Regular and Round. Motion - Bilateral-EOMI.  Chest and Lung Exam Auscultation Breath sounds - clear at anterior chest wall and clear at posterior chest wall. Adventitious sounds - No Adventitious sounds.  Cardiovascular Auscultation Rhythm - Regular rate and rhythm. Heart Sounds - S1 WNL and S2 WNL. Murmurs & Other Heart Sounds - Auscultation of the heart reveals - No Murmurs.  Abdomen Palpation/Percussion Tenderness - Abdomen is non-tender to palpation. Rigidity (guarding) - Abdomen is soft. Auscultation Auscultation of the abdomen reveals - Bowel sounds normal.  Female Genitourinary Note: Not done, not pertinent to present illness   Musculoskeletal Note: A well-developed female in no distress. Right knee shows no swelling. Range is 0 to 130 with no tenderness or instability. Left knee varus deformity. Range about 5 to 120. Marked crepitus in range of motion, tenderness medial greater than lateral, no instability noted. Pulse, sensation, and motor intact.  RADIOGRAPHS Radiographs of both knees and lateral showed prosthesis on the right remains in excellent position. No periprosthetic abnormalities. There is no wear or osteolysis. Her left knee shows bone-on-bone arthritis in the medial and  patellofemoral compartments with slight varus deformity.  Assessment & Plan  Primary osteoarthritis of left knee (M17.12)  Note:Surgical Plans: Left Total Knee Replacement  Disposition: Home  PCP: Dr. Raliegh Ip - Patient has been seen preoperatively and felt to be stable for surgery.  IV TXA  Anesthesia Issues: Nausea with anesthesia  Signed electronically by Ok Edwards, III PA-C

## 2015-08-02 NOTE — Op Note (Signed)
Pre-operative diagnosis- Osteoarthritis  Left knee(s)  Post-operative diagnosis- Osteoarthritis Left knee(s)  Procedure-  Left  Total Knee Arthroplasty (Depuy Attune)  Surgeon- Dione Plover. Lidwina Kaner, MD  Assistant- [name], PA-C   Anesthesia-  Spinal  EBL-* No blood loss amount entered *   Drains Hemovac  Tourniquet time-  Total Tourniquet Time Documented: Thigh (Left) - 26 minutes Total: Thigh (Left) - 26 minutes     Complications- None  Condition-PACU - hemodynamically stable.   Brief Clinical Note  Cassandra Mcfarland is a 65 y.o. year old female with end stage OA of her left knee with progressively worsening pain and dysfunction. She has constant pain, with activity and at rest and significant functional deficits with difficulties even with ADLs. She has had extensive non-op management including analgesics, injections of cortisone and viscosupplements, and home exercise program, but remains in significant pain with significant dysfunction. Radiographs show bone on bone arthritis medial and patellofemoral. She presents now for left Total Knee Arthroplasty.    Procedure in detail---   The patient is brought into the operating room and positioned supine on the operating table. After successful administration of  Spinal,   a tourniquet is placed high on the  Left thigh(s) and the lower extremity is prepped and draped in the usual sterile fashion. Time out is performed by the operating team and then the  Left lower extremity is wrapped in Esmarch, knee flexed and the tourniquet inflated to 300 mmHg.       A midline incision is made with a ten blade through the subcutaneous tissue to the level of the extensor mechanism. A fresh blade is used to make a medial parapatellar arthrotomy. Soft tissue over the proximal medial tibia is subperiosteally elevated to the joint line with a knife and into the semimembranosus bursa with a Cobb elevator. Soft tissue over the proximal lateral tibia is  elevated with attention being paid to avoiding the patellar tendon on the tibial tubercle. The patella is everted, knee flexed 90 degrees and the ACL and PCL are removed. Findings are bone on bone medial and patellofemoral with large global osteophytes        The drill is used to create a starting hole in the distal femur and the canal is thoroughly irrigated with sterile saline to remove the fatty contents. The 5 degree Left  valgus alignment guide is placed into the femoral canal and the distal femoral cutting block is pinned to remove 9 mm off the distal femur. Resection is made with an oscillating saw.      The tibia is subluxed forward and the menisci are removed. The extramedullary alignment guide is placed referencing proximally at the medial aspect of the tibial tubercle and distally along the second metatarsal axis and tibial crest. The block is pinned to remove 19mm off the more deficient medial  side. Resection is made with an oscillating saw. Size 4is the most appropriate size for the tibia and the proximal tibia is prepared with the modular drill and keel punch for that size.      The femoral sizing guide is placed and size 4 is most appropriate. Rotation is marked off the epicondylar axis and confirmed by creating a rectangular flexion gap at 90 degrees. The size 4 cutting block is pinned in this rotation and the anterior, posterior and chamfer cuts are made with the oscillating saw. The intercondylar block is then placed and that cut is made.      Trial size 4  tibial component, trial size 4 posterior stabilized femur and a 8  mm posterior stabilized rotating platform insert trial is placed. Full extension is achieved with excellent varus/valgus and anterior/posterior balance throughout full range of motion. The patella is everted and thickness measured to be 22  mm. Free hand resection is taken to 12 mm, a 35 template is placed, lug holes are drilled, trial patella is placed, and it tracks  normally. Osteophytes are removed off the posterior femur with the trial in place. All trials are removed and the cut bone surfaces prepared with pulsatile lavage. Cement is mixed and once ready for implantation, the size 4 tibial implant, size  4 posterior stabilized femoral component, and the size 35 patella are cemented in place and the patella is held with the clamp. The trial insert is placed and the knee held in full extension. The Exparel (20 ml mixed with 30 ml saline) and .25% Bupivicaine, are injected into the extensor mechanism, posterior capsule, medial and lateral gutters and subcutaneous tissues.  All extruded cement is removed and once the cement is hard the permanent 8 mm posterior stabilized rotating platform insert is placed into the tibial tray.      The wound is copiously irrigated with saline solution and the extensor mechanism closed over a hemovac drain with #1 V-loc suture. The tourniquet is released for a total tourniquet time of 26  minutes. Flexion against gravity is 140 degrees and the patella tracks normally. Subcutaneous tissue is closed with 2.0 vicryl and subcuticular with running 4.0 Monocryl. The incision is cleaned and dried and steri-strips and a bulky sterile dressing are applied. The limb is placed into a knee immobilizer and the patient is awakened and transported to recovery in stable condition.      Please note that a surgical assistant was a medical necessity for this procedure in order to perform it in a safe and expeditious manner. Surgical assistant was necessary to retract the ligaments and vital neurovascular structures to prevent injury to them and also necessary for proper positioning of the limb to allow for anatomic placement of the prosthesis.   Dione Plover Madeline Pho, MD    08/02/2015, 10:41 AM

## 2015-08-02 NOTE — Interval H&P Note (Signed)
History and Physical Interval Note:  08/02/2015 9:08 AM  Cassandra Mcfarland  has presented today for surgery, with the diagnosis of left knee ostoearthritis  The various methods of treatment have been discussed with the patient and family. After consideration of risks, benefits and other options for treatment, the patient has consented to  Procedure(s): LEFT TOTAL KNEE ARTHROPLASTY (Left) as a surgical intervention .  The patient's history has been reviewed, patient examined, no change in status, stable for surgery.  I have reviewed the patient's chart and labs.  Questions were answered to the patient's satisfaction.     [name]

## 2015-08-02 NOTE — Anesthesia Postprocedure Evaluation (Signed)
Anesthesia Post Note  Patient: Cassandra Mcfarland  Procedure(s) Performed: Procedure(s) (LRB): LEFT TOTAL KNEE ARTHROPLASTY (Left)  Patient location during evaluation: PACU Anesthesia Type: Spinal and MAC Level of consciousness: awake and alert Pain management: pain level controlled Vital Signs Assessment: post-procedure vital signs reviewed and stable Respiratory status: spontaneous breathing and respiratory function stable Cardiovascular status: blood pressure returned to baseline and stable Postop Assessment: spinal receding Anesthetic complications: no    Last Vitals:  Filed Vitals:   08/02/15 1430 08/02/15 1530  BP: 118/72 99/54  Pulse: 68 63  Temp: 36.8 C 36.7 C  Resp: 14 14    Last Pain:  Filed Vitals:   08/02/15 1537  PainSc: 2                  Tiajuana Amass

## 2015-08-02 NOTE — Progress Notes (Signed)
PT Cancellation Note  Patient Details Name: Cassandra Mcfarland MRN: [id number] DOB: August 10, 1950   Cancelled Treatment:    Reason Eval/Treat Not Completed: Medical issues which prohibited therapy (pt nauseous. Will attempt PT eval tomorrow morning. )   [name] 08/02/2015, 4:00 PM [phone]

## 2015-08-02 NOTE — Anesthesia Preprocedure Evaluation (Addendum)
Anesthesia Evaluation  Patient identified by MRN, date of birth, ID band Patient awake    Reviewed: Allergy & Precautions, NPO status , Patient's Chart, lab work & pertinent test results  History of Anesthesia Complications (+) PONV  Airway Mallampati: II  TM Distance: >3 FB Neck ROM: Full    Dental   Pulmonary neg pulmonary ROS,    breath sounds clear to auscultation       Cardiovascular hypertension, Pt. on medications  Rhythm:Regular Rate:Normal     Neuro/Psych Anxiety Depression negative neurological ROS     GI/Hepatic negative GI ROS, Neg liver ROS,   Endo/Other  Hypothyroidism   Renal/GU negative Renal ROS     Musculoskeletal  (+) Arthritis ,   Abdominal   Peds  Hematology negative hematology ROS (+)   Anesthesia Other Findings   Reproductive/Obstetrics                            Lab Results  Component Value Date   WBC 5.7 07/27/2015   HGB 12.6 07/27/2015   HCT 38.3 07/27/2015   MCV 91.8 07/27/2015   PLT 207 07/27/2015   Lab Results  Component Value Date   CREATININE 0.93 07/27/2015   BUN 26* 07/27/2015   NA 137 07/27/2015   K 5.1 07/27/2015   CL 103 07/27/2015   CO2 26 07/27/2015   Lab Results  Component Value Date   INR 0.96 07/27/2015   INR 0.95 06/21/2010    Anesthesia Physical Anesthesia Plan  ASA: II  Anesthesia Plan: MAC and Spinal   Post-op Pain Management:    Induction:   Airway Management Planned: Natural Airway and Simple Face Mask  Additional Equipment:   Intra-op Plan:   Post-operative Plan:   Informed Consent: I have reviewed the patients History and Physical, chart, labs and discussed the procedure including the risks, benefits and alternatives for the proposed anesthesia with the patient or authorized representative who has indicated his/her understanding and acceptance.     Plan Discussed with: CRNA  Anesthesia Plan Comments:         Anesthesia Quick Evaluation

## 2015-08-02 NOTE — Transfer of Care (Signed)
Immediate Anesthesia Transfer of Care Note  Patient: Cassandra Mcfarland  Procedure(s) Performed: Procedure(s): LEFT TOTAL KNEE ARTHROPLASTY (Left)  Patient Location: PACU  Anesthesia Type:Spinal  Level of Consciousness:  sedated, patient cooperative and responds to stimulation  Airway & Oxygen Therapy:Patient Spontanous Breathing and Patient connected to face mask oxgen  Post-op Assessment:  Report given to PACU RN and Post -op Vital signs reviewed and stable  Post vital signs:  Reviewed and stable  Last Vitals:  Filed Vitals:   08/02/15 0735  BP: 146/83  Pulse: 81  Temp: 36.4 C  Resp: 16    Complications: No apparent anesthesia complications, [id number] level on exam denied pain on assessment.

## 2015-08-02 NOTE — Anesthesia Procedure Notes (Signed)
Spinal Patient location during procedure: OR Start time: 08/02/2015 9:45 AM End time: 08/02/2015 9:49 AM Reason for block: at surgeon's request Staffing Resident/CRNA: Anne Fu Performed by: resident/CRNA  Preanesthetic Checklist Completed: patient identified, site marked, surgical consent, pre-op evaluation, timeout performed, IV checked, risks and benefits discussed, monitors and equipment checked and at surgeon's request Spinal Block Patient position: sitting Prep: ChloraPrep Patient monitoring: heart rate, continuous pulse ox and blood pressure Approach: right paramedian Location: L2-3 Injection technique: single-shot Needle Needle type: Whitacre  Needle gauge: 25 G Needle length: 9 cm Assessment Sensory level: T6 Additional Notes Expiration date of kit checked and confirmed. Patient tolerated procedure well, without complications. X 1 attempt with noted clear CSF return. Loss of motor and sensory on exam post injection.

## 2015-08-03 LAB — CBC
HCT: 29 % — ABNORMAL LOW (ref 36.0–46.0)
Hemoglobin: 9.9 g/dL — ABNORMAL LOW (ref 12.0–15.0)
MCH: 30.8 pg (ref 26.0–34.0)
MCHC: 34.1 g/dL (ref 30.0–36.0)
MCV: 90.3 fL (ref 78.0–100.0)
PLATELETS: 168 10*3/uL (ref 150–400)
RBC: 3.21 MIL/uL — AB (ref 3.87–5.11)
RDW: 13 % (ref 11.5–15.5)
WBC: 10.9 10*3/uL — AB (ref 4.0–10.5)

## 2015-08-03 LAB — BASIC METABOLIC PANEL
Anion gap: 6 (ref 5–15)
BUN: 14 mg/dL (ref 6–20)
CO2: 23 mmol/L (ref 22–32)
CREATININE: 0.76 mg/dL (ref 0.44–1.00)
Calcium: 8.5 mg/dL — ABNORMAL LOW (ref 8.9–10.3)
Chloride: 106 mmol/L (ref 101–111)
GFR calc Af Amer: >60 mL/min (ref >60)
Glucose, Bld: 154 mg/dL — ABNORMAL HIGH (ref 65–99)
POTASSIUM: 4.1 mmol/L (ref 3.5–5.1)
SODIUM: 135 mmol/L (ref 135–145)

## 2015-08-03 MED ORDER — HYDROMORPHONE HCL 2 MG PO TABS: 2.0000 mg | ORAL_TABLET | ORAL | Status: DC | PRN

## 2015-08-03 MED ORDER — METHOCARBAMOL 500 MG PO TABS: 500.0000 mg | ORAL_TABLET | Freq: Four times a day (QID) | ORAL | Status: DC | PRN

## 2015-08-03 MED ORDER — RIVAROXABAN 10 MG PO TABS: 10.0000 mg | ORAL_TABLET | Freq: Every day | ORAL | Status: DC

## 2015-08-03 MED ORDER — SODIUM CHLORIDE 0.9 % IV BOLUS (SEPSIS)
500.0000 mL | Freq: Once | INTRAVENOUS | Status: AC
  Administered 2015-08-03: 500 mL via INTRAVENOUS

## 2015-08-03 MED ORDER — TRAMADOL HCL 50 MG PO TABS: 50.0000 mg | ORAL_TABLET | Freq: Four times a day (QID) | ORAL | Status: DC | PRN

## 2015-08-03 MED ORDER — ONDANSETRON HCL 4 MG PO TABS: 4.0000 mg | ORAL_TABLET | Freq: Four times a day (QID) | ORAL | Status: DC | PRN

## 2015-08-03 MED ORDER — SODIUM CHLORIDE 0.9 % IV BOLUS (SEPSIS)
250.0000 mL | Freq: Once | INTRAVENOUS | Status: AC
  Administered 2015-08-03: 250 mL via INTRAVENOUS

## 2015-08-03 NOTE — Evaluation (Signed)
Physical Therapy Evaluation Patient Details Name: Cassandra Mcfarland MRN: [id number] DOB: Dec 24, 1950 Today's Date: 08/03/2015   History of Present Illness  s/p L TKA, h/o R TKA 16 years ago  Clinical Impression  Pt admitted with above diagnosis. Pt currently with functional limitations due to the deficits listed below (see PT Problem List). Pt ambulated 30' with RW, distance limited by L knee and B wrist/hand pain 2* arthritis. Performed L TKA exercises with assistance.  Good progress expected. Pt will benefit from skilled PT to increase their independence and safety with mobility to allow discharge to the venue listed below.       Follow Up Recommendations Home health PT    Equipment Recommendations  Rolling walker with 5" wheels    Recommendations for Other Services       Precautions / Restrictions Precautions Precautions: Knee;Fall Restrictions Weight Bearing Restrictions: No Other Position/Activity Restrictions: WBAT      Mobility  Bed Mobility Overal bed mobility: Modified Independent Bed Mobility: Supine to Sit     Supine to sit: Modified independent (Device/Increase time)     General bed mobility comments: self assisted LLE with RLE  Transfers Overall transfer level: Needs assistance Equipment used: Rolling walker (2 wheeled) Transfers: Sit to/from Stand Sit to Stand: Min assist         General transfer comment: min A to rise from bed, VCs hand placement  Ambulation/Gait Ambulation/Gait assistance: Min guard Ambulation Distance (Feet): 30 Feet Assistive device: Rolling walker (2 wheeled) Gait Pattern/deviations: Step-to pattern;Trunk flexed;Decreased stride length;Decreased weight shift to left   Gait velocity interpretation: Below normal speed for age/gender General Gait Details: arthritis in wrists/hands limits tolerance of use of RW, distance limited by L knee and B hand pain  Stairs            Wheelchair Mobility    Modified Rankin (Stroke  Patients Only)       Balance Overall balance assessment: Modified Independent                                           Pertinent Vitals/Pain Pain Assessment: 0-10 Pain Score: 8  Pain Location: L knee with activity Pain Descriptors / Indicators: Sore Pain Intervention(s): Limited activity within patient's tolerance;Monitored during session;Premedicated before session;Ice applied;RN gave pain meds during session    [place] expects to be discharged to:: Private residence Living Arrangements: Spouse/significant other Available Help at Discharge: Family;Available 24 hours/day   Home Access: Stairs to enter Entrance Stairs-Rails: None Entrance Stairs-Number of Steps: 2 Home Layout: One level Home Equipment: Walker - standard (chair for shower)      Prior Function Level of Independence: Independent               Hand Dominance        Extremity/Trunk Assessment   Upper Extremity Assessment: Generalized weakness (pt reports arthritis in wrists/hands making gripping and WB thru BUEs painful)           Lower Extremity Assessment: LLE deficits/detail   LLE Deficits / Details: 10-40* AAROM L knee limited by pain, SLR +2/5  Cervical / Trunk Assessment: Normal  Communication   Communication: No difficulties  Cognition Arousal/Alertness: Awake/alert Behavior During Therapy: WFL for tasks assessed/performed Overall Cognitive Status: Within Functional Limits for tasks assessed  General Comments      Exercises Total Joint Exercises Ankle Circles/Pumps: AROM;Both;10 reps;Supine Quad Sets: AROM;Both;10 reps;Supine Short Arc Quad: AROM;Left;10 reps;Supine Heel Slides: AAROM;Left;10 reps;Supine Hip ABduction/ADduction: AAROM;Left;10 reps;Supine Straight Leg Raises: AAROM;Left;5 reps;Supine Goniometric ROM: 10-40* L knee AAROM      Assessment/Plan    PT Assessment Patient needs continued PT  services  PT Diagnosis Difficulty walking;Acute pain   PT Problem List Decreased strength;Decreased range of motion;Decreased activity tolerance;Pain;Decreased knowledge of use of DME;Decreased mobility  PT Treatment Interventions DME instruction;Gait training;Stair training;Functional mobility training;Therapeutic activities;Patient/family education;Therapeutic exercise   PT Goals (Current goals can be found in the Care Plan section) Acute Rehab PT Goals Patient Stated Goal: to walk, to ride her horse PT Goal Formulation: With patient/family Time For Goal Achievement: 08/10/15 Potential to Achieve Goals: Good    Frequency 7X/week   Barriers to discharge        Co-evaluation               End of Session Equipment Utilized During Treatment: Gait belt Activity Tolerance: Patient limited by pain Patient left: in chair;with call bell/phone within reach;with family/visitor present Nurse Communication: Mobility status         Time: 1232-1256 PT Time Calculation (min) (ACUTE ONLY): 24 min   Charges:   PT Evaluation $PT Eval Low Complexity: 1 Procedure PT Treatments $Gait Training: 8-22 mins   PT G Codes:        Philomena Doheny 08/03/2015, 1:08 PM [phone]

## 2015-08-03 NOTE — Discharge Summary (Signed)
Physician Discharge Summary   Patient ID: Cassandra Mcfarland MRN: [id number] DOB/AGE: 1950-09-06 65 y.o.  Admit date: 08/02/2015 Discharge date: 08/04/2015  Primary Diagnosis: primary osteoarthritis left knee   Admission Diagnoses:  Past Medical History  Diagnosis Date  . Osteoarthritis   . Thyroid disease     hyper parathyroidism  . Anxiety   . Depression   . Situational depression   . Situational anxiety 09/2003  . Complication of anesthesia   . PONV (postoperative nausea and vomiting)   . Hypertension   . History of urinary tract infection    Discharge Diagnoses:   Principal Problem:   OA (osteoarthritis) of knee  Estimated body mass index is 21.97 kg/(m^2) as calculated from the following:   Height as of this encounter: _0  (1.651 m).   Weight as of this encounter: 59.875 kg (132 lb).  Procedure:  Procedure(s) (LRB): LEFT TOTAL KNEE ARTHROPLASTY (Left)   Consults: None  HPI: The patient is a 65 year old female who is 16 years out from her right total knee arthroplasty. The patient states that she is doing very well other than a little soreness over the patella tendon at times. The pain is under excellent control at this time and describe their pain as mild. The patient is also being followed for their left knee pain and osteoarthritis. They are now year(s) out from when symptoms began. Symptoms reported include: pain (medial side pain), aching, pain with weightbearing, difficulty arising from a kneeling position and difficulty arising from chair. The patient feels that they are doing poorly and report their pain level to be moderate. Current treatment includes: NSAIDs. No previous surgeries or injections in the left knee. She has pain with activity and at rest. She would like to get set up to have the left knee replaced. Radiographs of both knees and lateral showed prosthesis on the right remains in excellent position. No periprosthetic abnormalities. There is no wear or  osteolysis. Her left knee shows bone-on-bone arthritis in the medial and patellofemoral compartments with slight varus deformity. At this point, the most predictable means of improving pain and function is total knee arthroplasty. The procedure, risks, potential complications and rehab course are discussed in detail and the patient elects to proceed. She has got advanced end-stage arthritis of the left knee with progressive pain and dysfunction. She is at a stage where she would like to go ahead and get this fixed. We discussed the differences now compared to 16 years ago. She is ready to go ahead and proceed. They have been treated conservatively in the past for the above stated problem and despite conservative measures, they continue to have progressive pain and severe functional limitations and dysfunction. They have failed non-operative management including home exercise, medications, and injections. It is felt that they would benefit from undergoing total joint replacement. Risks and benefits of the procedure have been discussed with the patient and they elect to proceed with surgery. There are no active contraindications to surgery such as ongoing infection or rapidly progressive neurological disease.  Laboratory Data: Admission on 08/02/2015  Component Date Value Ref Range Status  . WBC 08/03/2015 10.9* 4.0 - 10.5 K/uL Final  . RBC 08/03/2015 3.21* 3.87 - 5.11 MIL/uL Final  . Hemoglobin 08/03/2015 9.9* 12.0 - 15.0 g/dL Final  . HCT 08/03/2015 29.0* 36.0 - 46.0 % Final  . MCV 08/03/2015 90.3  78.0 - 100.0 fL Final  . MCH 08/03/2015 30.8  26.0 - 34.0 pg Final  .  MCHC 08/03/2015 34.1  30.0 - 36.0 g/dL Final  . RDW 08/03/2015 13.0  11.5 - 15.5 % Final  . Platelets 08/03/2015 168  150 - 400 K/uL Final  . Sodium 08/03/2015 135  135 - 145 mmol/L Final  . Potassium 08/03/2015 4.1  3.5 - 5.1 mmol/L Final  . Chloride 08/03/2015 106  101 - 111 mmol/L Final  . CO2 08/03/2015 23  22 - 32 mmol/L Final    . Glucose, Bld 08/03/2015 154* 65 - 99 mg/dL Final  . BUN 08/03/2015 14  6 - 20 mg/dL Final  . Creatinine, Ser 08/03/2015 0.76  0.44 - 1.00 mg/dL Final  . Calcium 08/03/2015 8.5* 8.9 - 10.3 mg/dL Final  . GFR calc non Af Amer 08/03/2015 >60  >60 mL/min Final  . GFR calc Af Amer 08/03/2015 >60  >60 mL/min Final   Comment: (NOTE) The eGFR has been calculated using the CKD EPI equation. This calculation has not been validated in all clinical situations. eGFR's persistently <60 mL/min signify possible Chronic Kidney Disease.   Georgiann Hahn gap 08/03/2015 6  5 - 15 Final  Hospital Outpatient Visit on 07/27/2015  Component Date Value Ref Range Status  . aPTT 07/27/2015 30  24 - 37 seconds Final  . WBC 07/27/2015 5.7  4.0 - 10.5 K/uL Final  . RBC 07/27/2015 4.17  3.87 - 5.11 MIL/uL Final  . Hemoglobin 07/27/2015 12.6  12.0 - 15.0 g/dL Final  . HCT 07/27/2015 38.3  36.0 - 46.0 % Final  . MCV 07/27/2015 91.8  78.0 - 100.0 fL Final  . MCH 07/27/2015 30.2  26.0 - 34.0 pg Final  . MCHC 07/27/2015 32.9  30.0 - 36.0 g/dL Final  . RDW 07/27/2015 13.3  11.5 - 15.5 % Final  . Platelets 07/27/2015 207  150 - 400 K/uL Final  . Sodium 07/27/2015 137  135 - 145 mmol/L Final  . Potassium 07/27/2015 5.1  3.5 - 5.1 mmol/L Final  . Chloride 07/27/2015 103  101 - 111 mmol/L Final  . CO2 07/27/2015 26  22 - 32 mmol/L Final  . Glucose, Bld 07/27/2015 95  65 - 99 mg/dL Final  . BUN 07/27/2015 26* 6 - 20 mg/dL Final  . Creatinine, Ser 07/27/2015 0.93  0.44 - 1.00 mg/dL Final  . Calcium 07/27/2015 9.8  8.9 - 10.3 mg/dL Final  . Total Protein 07/27/2015 7.3  6.5 - 8.1 g/dL Final  . Albumin 07/27/2015 4.6  3.5 - 5.0 g/dL Final  . AST 07/27/2015 31  15 - 41 U/L Final  . ALT 07/27/2015 28  14 - 54 U/L Final  . Alkaline Phosphatase 07/27/2015 56  38 - 126 U/L Final  . Total Bilirubin 07/27/2015 0.6  0.3 - 1.2 mg/dL Final  . GFR calc non Af Amer 07/27/2015 >60  >60 mL/min Final  . GFR calc Af Amer 07/27/2015 >60   >60 mL/min Final   Comment: (NOTE) The eGFR has been calculated using the CKD EPI equation. This calculation has not been validated in all clinical situations. eGFR's persistently <60 mL/min signify possible Chronic Kidney Disease.   . Anion gap 07/27/2015 8  5 - 15 Final  . Prothrombin Time 07/27/2015 13.0  11.6 - 15.2 seconds Final  . INR 07/27/2015 0.96  0.00 - 1.49 Final  . ABO/RH(D) 07/27/2015 A POS   Final  . Antibody Screen 07/27/2015 NEG   Final  . Sample Expiration 07/27/2015 08/10/2015   Final  . Extend sample reason 07/27/2015 NO TRANSFUSIONS  OR PREGNANCY IN THE PAST 3 MONTHS   Final  . Color, Urine 07/27/2015 YELLOW  YELLOW Final  . APPearance 07/27/2015 CLEAR  CLEAR Final  . Specific Gravity, Urine 07/27/2015 1.007  1.005 - 1.030 Final  . pH 07/27/2015 7.0  5.0 - 8.0 Final  . Glucose, UA 07/27/2015 NEGATIVE  NEGATIVE mg/dL Final  . Hgb urine dipstick 07/27/2015 NEGATIVE  NEGATIVE Final  . Bilirubin Urine 07/27/2015 NEGATIVE  NEGATIVE Final  . Ketones, ur 07/27/2015 NEGATIVE  NEGATIVE mg/dL Final  . Protein, ur 07/27/2015 NEGATIVE  NEGATIVE mg/dL Final  . Nitrite 07/27/2015 NEGATIVE  NEGATIVE Final  . Leukocytes, UA 07/27/2015 NEGATIVE  NEGATIVE Final   MICROSCOPIC NOT DONE ON URINES WITH NEGATIVE PROTEIN, BLOOD, LEUKOCYTES, NITRITE, OR GLUCOSE <1000 mg/dL.  Marland Kitchen MRSA, PCR 07/27/2015 NEGATIVE  NEGATIVE Final  . Staphylococcus aureus 07/27/2015 NEGATIVE  NEGATIVE Final   Comment:        The Xpert SA Assay (FDA approved for NASAL specimens in patients over 42 years of age), is one component of a comprehensive surveillance program.  Test performance has been validated by [facility] for patients greater than or equal to 66 year old. It is not intended to diagnose infection nor to guide or monitor treatment.   . ABO/RH(D) 07/27/2015 A POS   Final  Office Visit on 06/21/2015  Component Date Value Ref Range Status  . Color, UA 06/21/2015 yellow   Final  . Clarity, UA  06/21/2015 clear   Final  . Glucose, UA 06/21/2015 negative   Final  . Bilirubin, UA 06/21/2015 negative   Final  . Ketones, UA 06/21/2015 negative   Final  . Spec Grav, UA 06/21/2015 1.010   Final  . Blood, UA 06/21/2015 negative   Final  . pH, UA 06/21/2015 6.0   Final  . Protein, UA 06/21/2015 negative   Final  . Urobilinogen, UA 06/21/2015 0.2   Final  . Nitrite, UA 06/21/2015 negative   Final  . Leukocytes, UA 06/21/2015 large (3+)* Negative Final  . Colony Count 06/21/2015 >=100,000 COLONIES/ML   Final  . Organism ID, Bacteria 06/21/2015 GROUP B STREP (S.AGALACTIAE) ISOLATED   Final   Comment: Testing against S. agalactiae not routinely performed due to predictability of AMP/PEN/VAN susceptibility.      X-Rays:No results found.  EKG: Orders placed or performed during the hospital encounter of 07/27/15  . EKG 12 lead  . EKG 12 lead     Hospital Course: Cassandra Mcfarland is a 65 y.o. who was admitted to [facility]. They were brought to the operating room on 08/02/2015 and underwent Procedure(s): LEFT TOTAL KNEE ARTHROPLASTY.  Patient tolerated the procedure well and was later transferred to the recovery room and then to the orthopaedic floor for postoperative care.  They were given PO and IV analgesics for pain control following their surgery.  They were given 24 hours of postoperative antibiotics of  Anti-infectives    Start     Dose/Rate Route Frequency Ordered Stop   08/02/15 1600  ceFAZolin (ANCEF) IVPB 2g/100 mL premix     2 g 200 mL/hr over 30 Minutes Intravenous Every 6 hours 08/02/15 1213 08/02/15 2148   08/02/15 0803  ceFAZolin (ANCEF) IVPB 2g/100 mL premix     2 g 200 mL/hr over 30 Minutes Intravenous On call to O.R. 08/02/15 7824 08/02/15 0951     and started on DVT prophylaxis in the form of Xarelto.   PT and OT were ordered for total  joint protocol.  Discharge planning consulted to help with postop disposition and equipment needs.  Patient had a fair night  on the evening of surgery.  They started to get up OOB with therapy on day one. Hemovac drain was pulled without difficulty.  Continued to work with therapy into day two.  Dressing was changed on day two and the incision was clean and dry.  The patient had progressed with therapy and meeting their goals.  Incision was healing well.  Patient was seen in rounds and was ready to go home.   Diet: Cardiac diet Activity:WBAT Follow-up:in 2 weeks Disposition - Home Discharged Condition: stable   Discharge Instructions    Call MD / Call 911    Complete by:  As directed   If you experience chest pain or shortness of breath, CALL 911 and be transported to the hospital emergency room.  If you develope a fever above 101 F, pus (white drainage) or increased drainage or redness at the wound, or calf pain, call your surgeon's office.     Change dressing    Complete by:  As directed   Change dressing daily with sterile 4 x 4 inch gauze dressing and apply TED hose. Do not submerge the incision under water.     Constipation Prevention    Complete by:  As directed   Drink plenty of fluids.  Prune juice may be helpful.  You may use a stool softener, such as Colace (over the counter) 100 mg twice a day.  Use MiraLax (over the counter) for constipation as needed.     Diet - low sodium heart healthy    Complete by:  As directed      Discharge instructions    Complete by:  As directed   Pick up stool softner and laxative for home use following surgery while on pain medications. Do not submerge incision under water. Please use good hand washing techniques while changing dressing each day. May shower starting three days after surgery. Please use a clean towel to pat the incision dry following showers. Continue to use ice for pain and swelling after surgery. Do not use any lotions or creams on the incision until instructed by your surgeon.  Take Xarelto for two and a half more weeks, then discontinue  Xarelto. Once the patient has completed the blood thinner regimen, then take a Baby 81 mg Aspirin daily for three more weeks.  Postoperative Constipation Protocol  Constipation - defined medically as fewer than three stools per week and severe constipation as less than one stool per week.  One of the most common issues patients have following surgery is constipation.  Even if you have a regular bowel pattern at home, your normal regimen is likely to be disrupted due to multiple reasons following surgery.  Combination of anesthesia, postoperative narcotics, change in appetite and fluid intake all can affect your bowels.  In order to avoid complications following surgery, here are some recommendations in order to help you during your recovery period.  Colace (docusate) - Pick up an over-the-counter form of Colace or another stool softener and take twice a day as long as you are requiring postoperative pain medications.  Take with a full glass of water daily.  If you experience loose stools or diarrhea, hold the colace until you stool forms back up.  If your symptoms do not get better within 1 week or if they get worse, check with your doctor.  Dulcolax (bisacodyl) - Pick up over-the-counter  and take as directed by the product packaging as needed to assist with the movement of your bowels.  Take with a full glass of water.  Use this product as needed if not relieved by Colace only.   MiraLax (polyethylene glycol) - Pick up over-the-counter to have on hand.  MiraLax is a solution that will increase the amount of water in your bowels to assist with bowel movements.  Take as directed and can mix with a glass of water, juice, soda, coffee, or tea.  Take if you go more than two days without a movement. Do not use MiraLax more than once per day. Call your doctor if you are still constipated or irregular after using this medication for 7 days in a row.  If you continue to have problems with postoperative  constipation, please contact the office for further assistance and recommendations.  If you experience "the worst abdominal pain ever" or develop nausea or vomiting, please contact the office immediatly for further recommendations for treatment.     Do not put a pillow under the knee. Place it under the heel.    Complete by:  As directed      Do not sit on low chairs, stoools or toilet seats, as it may be difficult to get up from low surfaces    Complete by:  As directed      Driving restrictions    Complete by:  As directed   No driving until released by the physician.     Increase activity slowly as tolerated    Complete by:  As directed      Lifting restrictions    Complete by:  As directed   No lifting until released by the physician.     Patient may shower    Complete by:  As directed   You may shower without a dressing once there is no drainage.  Do not wash over the wound.  If drainage remains, do not shower until drainage stops.     TED hose    Complete by:  As directed   Use stockings (TED hose) for 3 weeks on both leg(s).  You may remove them at night for sleeping.     Weight bearing as tolerated    Complete by:  As directed   Laterality:  left  Extremity:  Lower            Medication List    STOP taking these medications        diclofenac 75 MG EC tablet  Commonly known as:  VOLTAREN     multivitamin capsule     PROBIOTIC DAILY PO      TAKE these medications        acetaminophen 500 MG tablet  Commonly known as:  TYLENOL  Take 1,000 mg by mouth every 6 (six) hours as needed (For pain.).     ALPRAZolam 0.5 MG tablet  Commonly known as:  XANAX  TAKE 1 TABLET BY MOUTH AT BEDTIME AS NEEDED     diazepam 5 MG tablet  Commonly known as:  VALIUM  Take 1 tablet (5 mg total) by mouth daily as needed for anxiety.     HYDROmorphone 2 MG tablet  Commonly known as:  DILAUDID  Take 1-2 tablets (2-4 mg total) by mouth every 4 (four) hours as needed for moderate pain  or severe pain.     lisinopril 10 MG tablet  Commonly known as:  PRINIVIL,ZESTRIL  TAKE 1 TABLET BY MOUTH  DAILY     methocarbamol 500 MG tablet  Commonly known as:  ROBAXIN  Take 1 tablet (500 mg total) by mouth every 6 (six) hours as needed for muscle spasms.     ondansetron 4 MG tablet  Commonly known as:  ZOFRAN  Take 1 tablet (4 mg total) by mouth every 6 (six) hours as needed for nausea.     rivaroxaban 10 MG Tabs tablet  Commonly known as:  XARELTO  Take 1 tablet (10 mg total) by mouth daily with breakfast. Take Xarelto for two and a half more weeks, then discontinue Xarelto. Once the patient has completed the blood thinner regimen, then take a Baby 81 mg Aspirin daily for three more weeks.     rosuvastatin 10 MG tablet  Commonly known as:  CRESTOR  1 tablet twice weekly     traMADol 50 MG tablet  Commonly known as:  ULTRAM  Take 1-2 tablets (50-100 mg total) by mouth every 6 (six) hours as needed (mild pain).           Follow-up Information    Follow up with [name], MD. Schedule an appointment as soon as possible for a visit on 08/17/2015.   Specialty:  Orthopedic Surgery   Why:  Call office at [phone] to setup appointment on Tuesday 08/17/15 with Dr. Wynelle Link.   Contact information:   [address] [place] [address] [phone]       Signed: Ardeen Jourdain, PA-C Orthopaedic Surgery 08/04/2015 [phone]

## 2015-08-03 NOTE — Progress Notes (Signed)
PT Cancellation Note  Patient Details Name: Cassandra Mcfarland MRN: [id number] DOB: [birth year]   Cancelled Treatment:    Reason Eval/Treat Not Completed: Medical issues which prohibited therapy (pt with BP of 71/42 and lightheaded with OT this morning. Will attempt later today. )   Philomena Doheny 08/03/2015, 10:03 AM [phone]

## 2015-08-03 NOTE — Care Management Note (Signed)
Case Management Note  Patient Details  Name: KYLINA VULTAGGIO MRN: [id number] Date of Birth: 1950-10-22  Subjective/Objective:                  LEFT TOTAL KNEE ARTHROPLASTY (Left) Action/Plan: Discharge planning Expected Discharge Date:  08/04/15               Expected Discharge Plan:  [place]  In-House Referral:     Discharge planning Services  CM Consult  Post Acute Care Choice:    Choice offered to:  Patient  DME Arranged:  3-N-1 DME Agency:  [place]:  PT [place]:  [place]  Status of Service:  Completed, signed off  Medicare Important Message Given:    Date Medicare IM Given:    Medicare IM give by:    Date Additional Medicare IM Given:    Additional Medicare Important Message give by:     If discussed at [place] of Stay Meetings, dates discussed:    Additional Comments: CM met with pt in room to offer choice of home health agency.  Pt chooses [facility] to render HHPT.  Referral given to [facility]  rep Tim.  Pt has walker at home and CM called [facility] DME rep, Germaine to please deliver the 3n1 to room prior to discharge.  No other CM needs were communicated. Dellie Catholic, RN 08/03/2015, 1:31 PM

## 2015-08-03 NOTE — Discharge Instructions (Addendum)
° °Dr. Frank Aluisio °Total Joint Specialist °[facility] °[address] °[place], [place] [address] °[phone] ° °TOTAL KNEE REPLACEMENT POSTOPERATIVE DIRECTIONS ° °Knee Rehabilitation, Guidelines Following Surgery  °Results after knee surgery are often greatly improved when you follow the exercise, range of motion and muscle strengthening exercises prescribed by your doctor. Safety measures are also important to protect the knee from further injury. Any time any of these exercises cause you to have increased pain or swelling in your knee joint, decrease the amount until you are comfortable again and slowly increase them. If you have problems or questions, call your caregiver or physical therapist for advice.  ° °HOME CARE INSTRUCTIONS  °Remove items at home which could result in a fall. This includes throw rugs or furniture in walking pathways.  °· ICE to the affected knee every three hours for 30 minutes at a time and then as needed for pain and swelling.  Continue to use ice on the knee for pain and swelling from surgery. You may notice swelling that will progress down to the foot and ankle.  This is normal after surgery.  Elevate the leg when you are not up walking on it.   °· Continue to use the breathing machine which will help keep your temperature down.  It is common for your temperature to cycle up and down following surgery, especially at night when you are not up moving around and exerting yourself.  The breathing machine keeps your lungs expanded and your temperature down. °· Do not place pillow under knee, focus on keeping the knee straight while resting ° °DIET °You may resume your previous home diet once your are discharged from the hospital. ° °DRESSING / WOUND CARE / SHOWERING °You may shower 3 days after surgery, but keep the wounds dry during showering.  You may use an occlusive plastic wrap (Press'n Seal for example), NO SOAKING/SUBMERGING IN THE BATHTUB.  If the  bandage gets wet, change with a clean dry gauze.  If the incision gets wet, pat the wound dry with a clean towel. °You may start showering once you are discharged home but do not submerge the incision under water. Just pat the incision dry and apply a dry gauze dressing on daily. °Change the surgical dressing daily and reapply a dry dressing each time. ° °ACTIVITY °Walk with your walker as instructed. °Use walker as long as suggested by your caregivers. °Avoid periods of inactivity such as sitting longer than an hour when not asleep. This helps prevent blood clots.  °You may resume a sexual relationship in one month or when given the OK by your doctor.  °You may return to work once you are cleared by your doctor.  °Do not drive a car for 6 weeks or until released by you surgeon.  °Do not drive while taking narcotics. ° °WEIGHT BEARING °Weight bearing as tolerated with assist device (walker, cane, etc) as directed, use it as long as suggested by your surgeon or therapist, typically at least 4-6 weeks. ° °POSTOPERATIVE CONSTIPATION PROTOCOL °Constipation - defined medically as fewer than three stools per week and severe constipation as less than one stool per week. ° °One of the most common issues patients have following surgery is constipation.  Even if you have a regular bowel pattern at home, your normal regimen is likely to be disrupted due to multiple reasons following surgery.  Combination of anesthesia, postoperative narcotics, change in appetite and fluid intake all can affect your bowels.    In order to avoid complications following surgery, here are some recommendations in order to help you during your recovery period. ° °Colace (docusate) - Pick up an over-the-counter form of Colace or another stool softener and take twice a day as long as you are requiring postoperative pain medications.  Take with a full glass of water daily.  If you experience loose stools or diarrhea, hold the colace until you stool forms  back up.  If your symptoms do not get better within 1 week or if they get worse, check with your doctor. ° °Dulcolax (bisacodyl) - Pick up over-the-counter and take as directed by the product packaging as needed to assist with the movement of your bowels.  Take with a full glass of water.  Use this product as needed if not relieved by Colace only.  ° °MiraLax (polyethylene glycol) - Pick up over-the-counter to have on hand.  MiraLax is a solution that will increase the amount of water in your bowels to assist with bowel movements.  Take as directed and can mix with a glass of water, juice, soda, coffee, or tea.  Take if you go more than two days without a movement. °Do not use MiraLax more than once per day. Call your doctor if you are still constipated or irregular after using this medication for 7 days in a row. ° °If you continue to have problems with postoperative constipation, please contact the office for further assistance and recommendations.  If you experience "the worst abdominal pain ever" or develop nausea or vomiting, please contact the office immediatly for further recommendations for treatment. ° °ITCHING ° If you experience itching with your medications, try taking only a single pain pill, or even half a pain pill at a time.  You can also use Benadryl over the counter for itching or also to help with sleep.  ° °TED HOSE STOCKINGS °Wear the elastic stockings on both legs for three weeks following surgery during the day but you may remove then at night for sleeping. ° °MEDICATIONS °See your medication summary on the “After Visit Summary” that the nursing staff will review with you prior to discharge.  You may have some home medications which will be placed on hold until you complete the course of blood thinner medication.  It is important for you to complete the blood thinner medication as prescribed by your surgeon.  Continue your approved medications as instructed at time of  discharge. ° °PRECAUTIONS °If you experience chest pain or shortness of breath - call 911 immediately for transfer to the hospital emergency department.  °If you develop a fever greater that 101 F, purulent drainage from wound, increased redness or drainage from wound, foul odor from the wound/dressing, or calf pain - CONTACT YOUR SURGEON.   °                                                °FOLLOW-UP APPOINTMENTS °Make sure you keep all of your appointments after your operation with your surgeon and caregivers. You should call the office at the above phone number and make an appointment for approximately two weeks after the date of your surgery or on the date instructed by your surgeon outlined in the "After Visit Summary". ° ° °RANGE OF MOTION AND STRENGTHENING EXERCISES  °Rehabilitation of the knee is important following a knee injury or   an operation. After just a few days of immobilization, the muscles of the thigh which control the knee become weakened and shrink (atrophy). Knee exercises are designed to build up the tone and strength of the thigh muscles and to improve knee motion. Often times heat used for twenty to thirty minutes before working out will loosen up your tissues and help with improving the range of motion but do not use heat for the first two weeks following surgery. These exercises can be done on a training (exercise) mat, on the floor, on a table or on a bed. Use what ever works the best and is most comfortable for you Knee exercises include:  °Leg Lifts - While your knee is still immobilized in a splint or cast, you can do straight leg raises. Lift the leg to 60 degrees, hold for 3 sec, and slowly lower the leg. Repeat 10-20 times 2-3 times daily. Perform this exercise against resistance later as your knee gets better.  °Quad and Hamstring Sets - Tighten up the muscle on the front of the thigh (Quad) and hold for 5-10 sec. Repeat this 10-20 times hourly. Hamstring sets are done by pushing the  foot backward against an object and holding for 5-10 sec. Repeat as with quad sets.  °· Leg Slides: Lying on your back, slowly slide your foot toward your buttocks, bending your knee up off the floor (only go as far as is comfortable). Then slowly slide your foot back down until your leg is flat on the floor again. °· Angel Wings: Lying on your back spread your legs to the side as far apart as you can without causing discomfort.  °A rehabilitation program following serious knee injuries can speed recovery and prevent re-injury in the future due to weakened muscles. Contact your doctor or a physical therapist for more information on knee rehabilitation.  ° °IF YOU ARE TRANSFERRED TO A SKILLED REHAB FACILITY °If the patient is transferred to a skilled rehab facility following release from the hospital, a list of the current medications will be sent to the facility for the patient to continue.  When discharged from the skilled rehab facility, please have the facility set up the patient's Home Health Physical Therapy prior to being released. Also, the skilled facility will be responsible for providing the patient with their medications at time of release from the facility to include their pain medication, the muscle relaxants, and their blood thinner medication. If the patient is still at the rehab facility at time of the two week follow up appointment, the skilled rehab facility will also need to assist the patient in arranging follow up appointment in our office and any transportation needs. ° °MAKE SURE YOU:  °Understand these instructions.  °Get help right away if you are not doing well or get worse.  ° ° °Pick up stool softner and laxative for home use following surgery while on pain medications. °Do not submerge incision under water. °Please use good hand washing techniques while changing dressing each day. °May shower starting three days after surgery. °Please use a clean towel to pat the incision dry following  showers. °Continue to use ice for pain and swelling after surgery. °Do not use any lotions or creams on the incision until instructed by your surgeon. ° °Take Xarelto for two and a half more weeks, then discontinue Xarelto. °Once the patient has completed the blood thinner regimen, then take a Baby 81 mg Aspirin daily for three more weeks. ° ° °Information   Information on my medicine - XARELTO® (Rivaroxaban) ° °This medication education was reviewed with me or my healthcare representative as part of my discharge preparation.  ° °Why was Xarelto® prescribed for you? °Xarelto® was prescribed for you to reduce the risk of blood clots forming after orthopedic surgery. The medical term for these abnormal blood clots is venous thromboembolism (VTE). ° °What do you need to know about xarelto® ? °Take your Xarelto® ONCE DAILY at the same time every day. °You may take it either with or without food. ° °If you have difficulty swallowing the tablet whole, you may crush it and mix in applesauce just prior to taking your dose. ° °Take Xarelto® exactly as prescribed by your doctor and DO NOT stop taking Xarelto® without talking to the doctor who prescribed the medication.  Stopping without other VTE prevention medication to take the place of Xarelto® may increase your risk of developing a clot. ° °After discharge, you should have regular check-up appointments with your healthcare provider that is prescribing your Xarelto®.   ° °What do you do if you miss a dose? °If you miss a dose, take it as soon as you remember on the same day then continue your regularly scheduled once daily regimen the next day. Do not take two doses of Xarelto® on the same day.  ° °Important Safety Information °A possible side effect of Xarelto® is bleeding. You should call your healthcare provider right away if you experience any of the following: °? Bleeding from an injury or your nose that does not stop. °? Unusual colored urine (red or dark brown) or unusual  colored stools (red or black). °? Unusual bruising for unknown reasons. °? A serious fall or if you hit your head (even if there is no bleeding). ° °Some medicines may interact with Xarelto® and might increase your risk of bleeding while on Xarelto®. To help avoid this, consult your healthcare provider or pharmacist prior to using any new prescription or non-prescription medications, including herbals, vitamins, non-steroidal anti-inflammatory drugs (NSAIDs) and supplements. ° °This website has more information on Xarelto®: www.xarelto.com. ° ° °

## 2015-08-03 NOTE — Progress Notes (Signed)
Physical Therapy Treatment Patient Details Name: Cassandra Mcfarland MRN: [id number] DOB: [birth year] Today's Date: 08/03/2015    History of Present Illness s/p L TKA, h/o R TKA 16 years ago    PT Comments    POD # 1 pm session Pt feeling better.  Just got back to bed from walking to bathroom with NT.  No dizziness.  Performed TKR TE's followed by ICE.  Follow Up Recommendations  Home health PT     Equipment Recommendations  Rolling walker with 5" wheels    Recommendations for Other Services       Precautions / Restrictions Precautions Precautions: Knee;Fall Restrictions Weight Bearing Restrictions: No LLE Weight Bearing: Weight bearing as tolerated Other Position/Activity Restrictions: WBAT          Cognition Arousal/Alertness: Awake/alert Behavior During Therapy: WFL for tasks assessed/performed Overall Cognitive Status: Within Functional Limits for tasks assessed                      Exercises Total Knee Replacement TE's 10 reps B LE ankle pumps 10 reps towel squeezes 10 reps knee presses 10 reps heel slides  10 reps SAQ's 10 reps SLR's 10 reps ABD Followed by ICE    General Comments        Pertinent Vitals/Pain Pain Score: 8  Pain Location: L knee with activity Pain Descriptors / Indicators: Sore Pain Intervention(s): Limited activity within patient's tolerance;Monitored during session;Premedicated before session;Ice applied;RN gave pain meds during session    [place] expects to be discharged to:: Private residence Living Arrangements: Spouse/significant other Available Help at Discharge: Family;Available 24 hours/day   Home Access: Stairs to enter Entrance Stairs-Rails: None Home Layout: One level Home Equipment: Walker - standard (chair for shower)      Prior Function Level of Independence: Independent          PT Goals (current goals can now be found in the care plan section) Acute Rehab PT Goals Patient Stated  Goal: to walk, to ride her horse PT Goal Formulation: With patient/family Time For Goal Achievement: 08/10/15 Potential to Achieve Goals: Good    Frequency  7X/week    PT Plan      Co-evaluation             End of Session Equipment Utilized During Treatment: Gait belt Activity Tolerance: Patient limited by pain Patient left: in chair;with call bell/phone within reach;with family/visitor present     Time: [id number] PT Time Calculation (min) (ACUTE ONLY): 15 min  Charges:   $Therapeutic Exercise: 8-22 mins                    G Codes:      Rica Koyanagi  PTA [facility]  Acute  Rehab Pager      [phone]

## 2015-08-03 NOTE — Progress Notes (Addendum)
   Subjective: 1 Day Post-Op Procedure(s) (LRB): LEFT TOTAL KNEE ARTHROPLASTY (Left) Patient reports pain as mild.   Patient seen in rounds with Dr. Wynelle Link. Soft pressures this morning. Will give additional fluids. Patient is well, but has had some minor complaints of pain in the knee, requiring pain medications We will start therapy today.  Plan is to go Home after hospital stay.  Objective: Vital signs in last 24 hours: Temp:  [97.2 F (36.2 C)-98.3 F (36.8 C)] 97.8 F (36.6 C) (06/06 0553) Pulse Rate:  [58-100] 85 (06/06 0553) Resp:  [6-16] 14 (06/06 0553) BP: (87-133)/(54-87) 101/58 mmHg (06/06 0553) SpO2:  [95 %-100 %] 95 % (06/06 0553)  Intake/Output from previous day:  Intake/Output Summary (Last 24 hours) at 08/03/15 0909 Last data filed at 08/03/15 0700  Gross per 24 hour  Intake   3170 ml  Output   2690 ml  Net    480 ml    Intake/Output this shift: UOP 900  Labs:  Recent Labs  08/03/15 0442  HGB 9.9*    Recent Labs  08/03/15 0442  WBC 10.9*  RBC 3.21*  HCT 29.0*  PLT 168    Recent Labs  08/03/15 0442  NA 135  K 4.1  CL 106  CO2 23  BUN 14  CREATININE 0.76  GLUCOSE 154*  CALCIUM 8.5*   No results for input(s): LABPT, INR in the last 72 hours.  EXAM General - Patient is Alert, Appropriate and Oriented Extremity - Neurovascular intact Sensation intact distally Dorsiflexion/Plantar flexion intact Dressing - dressing C/D/I Motor Function - intact, moving foot and toes well on exam.  Hemovac pulled without difficulty.  Past Medical History  Diagnosis Date  . Osteoarthritis   . Thyroid disease     hyper parathyroidism  . Anxiety   . Depression   . Situational depression   . Situational anxiety 09/2003  . Complication of anesthesia   . PONV (postoperative nausea and vomiting)   . Hypertension   . History of urinary tract infection     Assessment/Plan: 1 Day Post-Op Procedure(s) (LRB): LEFT TOTAL KNEE ARTHROPLASTY  (Left) Principal Problem:   OA (osteoarthritis) of knee  Estimated body mass index is 21.97 kg/(m^2) as calculated from the following:   Height as of this encounter: 5\' 5"  (1.651 m).   Weight as of this encounter: 59.875 kg (132 lb). Advance diet Up with therapy Plan for discharge tomorrow Discharge to home  DVT Prophylaxis - Xarelto Weight-Bearing as tolerated to left leg D/C O2 and Pulse OX and try on Room Air  Dr. Wynelle Link therapy study patient - Traditional PT at home - home tomorrow.  [name], PA-C Orthopaedic Surgery 08/03/2015, 9:09 AM

## 2015-08-03 NOTE — Evaluation (Signed)
Occupational Therapy Evaluation Patient Details Name: Cassandra Mcfarland MRN: [id number] DOB: 1950-12-04 Today's Date: 08/03/2015    History of Present Illness s/p L TKA, h/o R TKA 16 years ago   Clinical Impression   This 65 year old female was admitted for the above surgery. She became dizzy during OT evaluation after completing several tasks. She will benefit from continued OT to reinforce and educate on bathroom transfers.  Goals are for min guard level    Follow Up Recommendations  Supervision/Assistance - 24 hour    Equipment Recommendations  3 in 1 bedside comode    Recommendations for Other Services       Precautions / Restrictions Precautions Precautions: Knee;Fall Restrictions Other Position/Activity Restrictions: WBAT      Mobility Bed Mobility Overal bed mobility: Needs Assistance Bed Mobility: Supine to Sit     Supine to sit: Min assist     General bed mobility comments: assist for LLE  Transfers Overall transfer level: Needs assistance Equipment used: Rolling walker (2 wheeled) Transfers: Sit to/from Stand Sit to Stand: Min assist;Mod assist         General transfer comment: depending upon surface height.  Needs assist to rise; cues for UE/LE placement    Balance                                            ADL Overall ADL's : Needs assistance/impaired     Grooming: Oral care;Min guard;Standing           Upper Body Dressing : Set up;Sitting   Lower Body Dressing: Maximal assistance;Sit to/from stand   Toilet Transfer: Minimal assistance;Ambulation;BSC;RW;Moderate assistance (min to sit; mod A to rise from comfort height)             General ADL Comments: Ambulated to bathroom and used toilet then brushed teeth. Pt got dizzy when she was walking back to chair. See vitals section of chart.  Had donned LB clothing from EOB; did shirt from commode     Vision     Perception     Praxis      Pertinent Vitals/Pain  Pain Assessment: 0-10 Pain Score: 5  Pain Location: L knee Pain Descriptors / Indicators: Aching Pain Intervention(s): Limited activity within patient's tolerance;Monitored during session;Premedicated before session;Repositioned     Hand Dominance     Extremity/Trunk Assessment Upper Extremity Assessment Upper Extremity Assessment: Generalized weakness (difficulty with grip due to wrists and thumbs)           Communication Communication Communication: No difficulties   Cognition Arousal/Alertness: Awake/alert Behavior During Therapy: WFL for tasks assessed/performed Overall Cognitive Status: Within Functional Limits for tasks assessed                     General Comments       Exercises       Shoulder Instructions      Home Living Family/patient expects to be discharged to:: Private residence Living Arrangements: Spouse/significant other                 Bathroom Shower/Tub: Occupational psychologist: Standard     Home Equipment: Environmental consultant - standard (chair for shower)          Prior Functioning/Environment Level of Independence: Independent             OT Diagnosis: Generalized  weakness;Acute pain   OT Problem List: Decreased strength;Decreased activity tolerance;Decreased knowledge of use of DME or AE;Pain;Cardiopulmonary status limiting activity   OT Treatment/Interventions: Self-care/ADL training;DME and/or AE instruction;Patient/family education;Therapeutic activities    OT Goals(Current goals can be found in the care plan section) Acute Rehab OT Goals Patient Stated Goal: none stated; agreeable to OT OT Goal Formulation: With patient Time For Goal Achievement: 08/10/15 Potential to Achieve Goals: Good ADL Goals Pt Will Transfer to Toilet: with min guard assist;ambulating;bedside commode Pt Will Perform Tub/Shower Transfer: with min guard assist;Shower transfer;ambulating;3 in 1  OT Frequency: Min 2X/week   Barriers to D/C:             Co-evaluation              End of Session    Activity Tolerance: Treatment limited secondary to medical complications (Comment) (BP) Patient left: in bed;with call bell/phone within reach;with nursing/sitter in room   Time: 0928-1000 OT Time Calculation (min): 32 min Charges:  OT General Charges $OT Visit: 1 Procedure OT Evaluation $OT Eval Low Complexity: 1 Procedure OT Treatments $Self Care/Home Management : 8-22 mins G-Codes:    Elian Gloster 08-24-15, 10:12 AM  Lesle Chris, OTR/L [phone] 08-24-15

## 2015-08-04 DIAGNOSIS — D649 Anemia, unspecified: Secondary | ICD-10-CM | POA: Insufficient documentation

## 2015-08-04 LAB — BASIC METABOLIC PANEL
Anion gap: 5 (ref 5–15)
BUN: 10 mg/dL (ref 6–20)
CHLORIDE: 110 mmol/L (ref 101–111)
CO2: 23 mmol/L (ref 22–32)
Calcium: 8.3 mg/dL — ABNORMAL LOW (ref 8.9–10.3)
Creatinine, Ser: 0.67 mg/dL (ref 0.44–1.00)
GFR calc Af Amer: >60 mL/min (ref >60)
GFR calc non Af Amer: >60 mL/min (ref >60)
GLUCOSE: 111 mg/dL — AB (ref 65–99)
POTASSIUM: 3.8 mmol/L (ref 3.5–5.1)
SODIUM: 138 mmol/L (ref 135–145)

## 2015-08-04 LAB — CBC
HCT: 25.9 % — ABNORMAL LOW (ref 36.0–46.0)
HEMOGLOBIN: 8.8 g/dL — AB (ref 12.0–15.0)
MCH: 30.1 pg (ref 26.0–34.0)
MCHC: 34 g/dL (ref 30.0–36.0)
MCV: 88.7 fL (ref 78.0–100.0)
Platelets: 154 10*3/uL (ref 150–400)
RBC: 2.92 MIL/uL — AB (ref 3.87–5.11)
RDW: 13.2 % (ref 11.5–15.5)
WBC: 9.4 10*3/uL (ref 4.0–10.5)

## 2015-08-04 LAB — TYPE AND SCREEN
ABO/RH(D): A POS
ANTIBODY SCREEN: NEGATIVE

## 2015-08-04 NOTE — Progress Notes (Signed)
Physical Therapy Treatment Patient Details Name: Cassandra Mcfarland MRN: [id number] DOB: 1950/09/01 Today's Date: 08/04/2015    History of Present Illness s/p L TKA, h/o R TKA 16 years ago    PT Comments    POD # 2 am session Pt unable to perform active SLR and struggles to perform SAQ's.  Very weak lower quads.  Instructed to wear KI for amb and stairs.  Assisted with amb in hallway, practiced going up 2 small steps forward with walker then performed all supine TKR TE's following HEP handout.  Instructed on proper tech and freq as well as use of ICE. Pt ready for D/C to home.   Follow Up Recommendations  Home health PT     Equipment Recommendations  Rolling walker with 5" wheels    Recommendations for Other Services       Precautions / Restrictions Precautions Precautions: Knee;Fall Precaution Comments: instructed to wear KI for amb and stairs.  Very weal lower quads. Required Braces or Orthoses: Knee Immobilizer - Left Knee Immobilizer - Left: On when out of bed or walking;Discontinue once straight leg raise with < 10 degree lag Restrictions Weight Bearing Restrictions: No LLE Weight Bearing: Weight bearing as tolerated    Mobility  Bed Mobility               General bed mobility comments: OOB in recliner  Transfers Overall transfer level: Needs assistance Equipment used: Rolling walker (2 wheeled) Transfers: Sit to/from Stand Sit to Stand: Supervision         General transfer comment: < 25% VC's on proper hand placement and safety with turns  Ambulation/Gait Ambulation/Gait assistance: Supervision;Min guard Ambulation Distance (Feet): 85 Feet Assistive device: Rolling walker (2 wheeled) Gait Pattern/deviations: Step-to pattern;Step-through pattern;Decreased stance time - left Gait velocity: WFL   General Gait Details: c/o arthritis in wrists/hands limits tolerance of use of RW, distance limited by L knee and B hand pain   Stairs    up 2 small threshold  steps forward with walker        Wheelchair Mobility    Modified Rankin (Stroke Patients Only)       Balance                                    Cognition Arousal/Alertness: Awake/alert Behavior During Therapy: WFL for tasks assessed/performed Overall Cognitive Status: Within Functional Limits for tasks assessed                      Exercises   Total Knee Replacement TE's 10 reps B LE ankle pumps 10 reps towel squeezes 10 reps knee presses 10 reps heel slides  10 reps SAQ's 10 reps SLR's 10 reps ABD Followed by ICE     General Comments        Pertinent Vitals/Pain Pain Assessment: 0-10 Pain Score: 5  Pain Location: L knee with activity Pain Descriptors / Indicators: Discomfort;Tender Pain Intervention(s): Monitored during session;Premedicated before session;Repositioned;Ice applied    Home Living                      Prior Function            PT Goals (current goals can now be found in the care plan section) Progress towards PT goals: Progressing toward goals    Frequency  7X/week    PT Plan Current plan remains appropriate  Co-evaluation             End of Session Equipment Utilized During Treatment: Gait belt;Left knee immobilizer Activity Tolerance: Patient tolerated treatment well Patient left: in chair;with call bell/phone within reach;with family/visitor present     Time: [id number] PT Time Calculation (min) (ACUTE ONLY): 33 min  Charges:  $Gait Training: 8-22 mins $Therapeutic Exercise: 8-22 mins                    G Codes:      Nathanial Rancher 08/24/15, 11:43 AM

## 2015-08-04 NOTE — Progress Notes (Signed)
   Subjective: 2 Days Post-Op Procedure(s) (LRB): LEFT TOTAL KNEE ARTHROPLASTY (Left) Patient reports pain as mild.   Patient seen in rounds with Dr. Wynelle Link. Patient is well, and has had no acute complaints or problems. No issues overnight. No SOB or chest pain.  Plan is to go Home after hospital stay.  Objective: Vital signs in last 24 hours: Temp:  [98.2 F (36.8 C)-98.8 F (37.1 C)] 98.4 F (36.9 C) (06/07 0622) Pulse Rate:  [80-81] 80 (06/07 0622) Resp:  [15-17] 17 (06/07 0622) BP: (71-133)/(42-82) 133/65 mmHg (06/07 0622) SpO2:  [97 %-100 %] 100 % (06/07 0622)  Intake/Output from previous day:  Intake/Output Summary (Last 24 hours) at 08/04/15 0800 Last data filed at 08/04/15 [id number]  Gross per 24 hour  Intake 1358.75 ml  Output   3650 ml  Net -2291.25 ml     Labs:  Recent Labs  08/03/15 0442 08/04/15 0442  HGB 9.9* 8.8*    Recent Labs  08/03/15 0442 08/04/15 0442  WBC 10.9* 9.4  RBC 3.21* 2.92*  HCT 29.0* 25.9*  PLT 168 154    Recent Labs  08/03/15 0442 08/04/15 0442  NA 135 138  K 4.1 3.8  CL 106 110  CO2 23 23  BUN 14 10  CREATININE 0.76 0.67  GLUCOSE 154* 111*  CALCIUM 8.5* 8.3*    EXAM General - Patient is Alert and Oriented Extremity - Neurologically intact Intact pulses distally Dorsiflexion/Plantar flexion intact No cellulitis present Compartment soft Dressing/Incision - clean, dry, no drainage Motor Function - intact, moving foot and toes well on exam.   Past Medical History  Diagnosis Date  . Osteoarthritis   . Thyroid disease     hyper parathyroidism  . Anxiety   . Depression   . Situational depression   . Situational anxiety 09/2003  . Complication of anesthesia   . PONV (postoperative nausea and vomiting)   . Hypertension   . History of urinary tract infection     Assessment/Plan: 2 Days Post-Op Procedure(s) (LRB): LEFT TOTAL KNEE ARTHROPLASTY (Left) Principal Problem:   OA (osteoarthritis) of knee  Estimated  body mass index is 21.97 kg/(m^2) as calculated from the following:   Height as of this encounter: 5\' 5"  (1.651 m).   Weight as of this encounter: 59.875 kg (132 lb). Advance diet Up with therapy Discharge home with home health  DVT Prophylaxis - Xarelto Weight-Bearing as tolerated to left leg  PT today in house. DC home this afternoon.  Ardeen Jourdain, PA-C Orthopaedic Surgery 08/04/2015, 8:00 AM

## 2015-08-04 NOTE — Progress Notes (Signed)
Patient alert and oriented with pain controlled and vital signs stable. Patient given discharge instructions and prescriptions. All questions and concerns answered. Patient verbalized understanding of discharge instructions.

## 2015-08-04 NOTE — Progress Notes (Signed)
[facility] Therapy Treatment Patient Details Name: Cassandra Mcfarland MRN: [id number] DOB: [birth year] Today's Date: 08/04/2015    History of present illness s/p L TKA, h/o R TKA 16 years ago   OT comments  Tolerated session well and met goals.  No further OT needs  Follow Up Recommendations  Supervision/Assistance - 24 hour    Equipment Recommendations  3 in 1 bedside comode (delivered)    Recommendations for Other Services      Precautions / Restrictions  knee, fall L KI      Mobility Bed Mobility               General bed mobility comments: OOB in recliner  Transfers Overall transfer level: Needs assistance Equipment used: Rolling walker (2 wheeled) Transfers: Sit to/from Stand Sit to Stand: Supervision         General transfer comment: cues for safety to back up completely to chair prior to sitting    Balance                                   ADL Overall ADL's : Needs assistance/impaired                         Toilet Transfer: Min guard;Ambulation;Comfort height toilet;Grab bars;RW       Tub/ Shower Transfer: Walk-in shower;Min guard;Ambulation     General ADL Comments: simulated shower ledge.        Vision                     Perception     Praxis      Cognition   Behavior During Therapy: WFL for tasks assessed/performed Overall Cognitive Status: Within Functional Limits for tasks assessed                       Extremity/Trunk Assessment               Exercises     Shoulder Instructions       General Comments      Pertinent Vitals/ Pain       Pain Assessment: 0-10 Pain Score: 5  Pain Location: L knee with activity Pain Descriptors / Indicators: Discomfort;Tender Pain Intervention(s): Monitored during session;Premedicated before session;Repositioned;Ice applied  Home Living                                          Prior Functioning/Environment               Frequency       Progress Toward Goals  OT Goals(current goals can now be found in the care plan section)  Progress towards OT goals: Goals met/education completed, patient discharged from OT  Acute Rehab OT Goals Patient Stated Goal: to walk, to ride her horse  Plan      Co-evaluation                 End of Session     Activity Tolerance Patient tolerated treatment well   Patient Left in chair;with call bell/phone within reach;with chair alarm set;with family/visitor present   Nurse Communication          Time: 0932-3557 OT Time Calculation (min): 15 min  Charges: OT General Charges $OT Visit: 1  Procedure OT Treatments $Self Care/Home Management : 8-22 mins  Takeo Harts 08/04/2015, 11:48 AM Lesle Chris, OTR/L [phone] 08/04/2015

## 2015-09-21 ENCOUNTER — Encounter: Payer: Self-pay | Admitting: Internal Medicine

## 2015-09-21 ENCOUNTER — Ambulatory Visit (INDEPENDENT_AMBULATORY_CARE_PROVIDER_SITE_OTHER): Payer: BLUE CROSS/BLUE SHIELD | Admitting: Internal Medicine

## 2015-09-21 VITALS — BP 120/80 | HR 76 | Temp 97.8°F | Ht 65.0 in | Wt 124.0 lb

## 2015-09-21 DIAGNOSIS — E785 Hyperlipidemia, unspecified: Secondary | ICD-10-CM

## 2015-09-21 DIAGNOSIS — D500 Iron deficiency anemia secondary to blood loss (chronic): Secondary | ICD-10-CM

## 2015-09-21 DIAGNOSIS — M159 Polyosteoarthritis, unspecified: Secondary | ICD-10-CM

## 2015-09-21 DIAGNOSIS — M150 Primary generalized (osteo)arthritis: Secondary | ICD-10-CM | POA: Diagnosis not present

## 2015-09-21 DIAGNOSIS — D509 Iron deficiency anemia, unspecified: Secondary | ICD-10-CM | POA: Insufficient documentation

## 2015-09-21 DIAGNOSIS — I10 Essential (primary) hypertension: Secondary | ICD-10-CM

## 2015-09-21 MED ORDER — POLYSACCHARIDE IRON COMPLEX 150 MG PO CAPS: 150.0000 mg | ORAL_CAPSULE | Freq: Every day | ORAL | Status: DC

## 2015-09-21 NOTE — Patient Instructions (Signed)
Take an iron supplement twice daily  Discontinue lisinopril  Please check your blood pressure on a regular basis.  If it is consistently greater than 150/90, please make an office appointment.  Drink as much fluid as you  can tolerate over the next few days  Return in one month for follow-up

## 2015-09-21 NOTE — Progress Notes (Signed)
Subjective:    Patient ID: Cassandra Mcfarland, female    DOB: 1950-06-24, 65 y.o.   MRN: [id number]  HPI 65 year old patient who is had recent left total knee replacement surgery.  She had some postop anemia but has not been using any iron supplementation.  She was treated with prophylactic anticoagulation and now is on daily aspirin.  No melena or change in her bowel habits.  She states that she has been somewhat intolerant of iron in the past with some stomach upset and nausea. She has a history of essential hypertension and has been on lisinopril. She has had intermittent documented hypotension associated with weakness and dizziness.  She has not taken her medication in 2 days.  She has been given a trial off antihypertensives in the past, but only to require retreatment. She has a history of dyslipidemia and now is on statin therapy  Past Medical History:  Diagnosis Date  . Anxiety   . Complication of anesthesia   . Depression   . History of urinary tract infection   . Hypertension   . Osteoarthritis   . PONV (postoperative nausea and vomiting)   . Situational anxiety 09/2003  . Situational depression   . Thyroid disease    hyper parathyroidism     Social History   Social History  . Marital status: Married    Spouse name: N/A  . Number of children: N/A  . Years of education: N/A   Occupational History  . Not on file.   Social History Main Topics  . Smoking status: Never Smoker  . Smokeless tobacco: Never Used  . Alcohol use Yes     Comment: occas glass of wine   . Drug use: No  . Sexual activity: Yes    Partners: Male    Birth control/ protection: Post-menopausal   Other Topics Concern  . Not on file   Social History Narrative  . No narrative on file    Past Surgical History:  Procedure Laterality Date  . abddominoplasty  1995  . APPENDECTOMY  Age 59  . BREAST REDUCTION SURGERY  09/2001   & Lift  . PARATHYROIDECTOMY  5/1/112   due to hypercalcemia  .  SHOULDER SURGERY     left rotator cuff; bicep tendon repair on left   . THUMB FUSION     Dr. Loney Loh  . THUMB FUSION  08/2007   with bone graft- post trauma  . toatal knee replacement Right 2001  . TOTAL KNEE ARTHROPLASTY Left 08/02/2015   Procedure: LEFT TOTAL KNEE ARTHROPLASTY;  Surgeon: Gaynelle Arabian, MD;  Location: [facility];  Service: Orthopedics;  Laterality: Left;  . TUBAL LIGATION  1994    Family History  Problem Relation Age of Onset  . Coronary artery disease Father   . Heart disease Father   . Heart attack Father   . Coronary artery disease Brother   . Hypertension Brother   . Cancer Mother   . Leukemia Mother 74  . Osteoporosis Mother   . Hypertension Sister   . Osteoporosis Sister   . Osteoporosis Maternal Aunt   . Hypertension Brother     Allergies  Allergen Reactions  . Ciprofloxacin Other (See Comments)    Severe burning, blisters on toes  . Codeine Nausea And Vomiting  . Tetanus-Diphtheria Toxoids Td Swelling    Current Outpatient Prescriptions on File Prior to Visit  Medication Sig Dispense Refill  . acetaminophen (TYLENOL) 500 MG tablet Take 1,000 mg by mouth every  6 (six) hours as needed (For pain.).    Marland Kitchen ALPRAZolam (XANAX) 0.5 MG tablet TAKE 1 TABLET BY MOUTH AT BEDTIME AS NEEDED (Patient taking differently: TAKE 1 TABLET BY MOUTH AT BEDTIME AS NEEDED FOR ANXIETY.) 90 tablet 1  . diazepam (VALIUM) 5 MG tablet Take 1 tablet (5 mg total) by mouth daily as needed for anxiety. 90 tablet 1  . rosuvastatin (CRESTOR) 10 MG tablet 1 tablet twice weekly (Patient taking differently: Take 10 mg by mouth 2 (two) times a week. Take on Wednesdays and Saturdays.) 60 tablet 3  . traMADol (ULTRAM) 50 MG tablet Take 1-2 tablets (50-100 mg total) by mouth every 6 (six) hours as needed (mild pain). 80 tablet 1   No current facility-administered medications on file prior to visit.     BP 120/80   Pulse 76   Temp 97.8 F (36.6 C) (Oral)   Ht 5\' 5"  (1.651 m)   Wt 124 lb  (56.2 kg)   LMP 11/10/2014 (Exact Date)   SpO2 98%   BMI 20.63 kg/m      Review of Systems  Constitutional: Positive for fatigue.  HENT: Negative for congestion, dental problem, hearing loss, rhinorrhea, sinus pressure, sore throat and tinnitus.   Eyes: Negative for pain, discharge and visual disturbance.  Respiratory: Negative for cough and shortness of breath.   Cardiovascular: Negative for chest pain, palpitations and leg swelling.  Gastrointestinal: Negative for abdominal distention, abdominal pain, blood in stool, constipation, diarrhea, nausea and vomiting.  Genitourinary: Negative for difficulty urinating, dysuria, flank pain, frequency, hematuria, pelvic pain, urgency, vaginal bleeding, vaginal discharge and vaginal pain.  Musculoskeletal: Negative for arthralgias, gait problem and joint swelling.  Skin: Negative for rash.  Neurological: Positive for dizziness, weakness and light-headedness. Negative for syncope, speech difficulty, numbness and headaches.  Hematological: Negative for adenopathy.  Psychiatric/Behavioral: Negative for agitation, behavioral problems and dysphoric mood. The patient is not nervous/anxious.        Objective:   Physical Exam  Constitutional: She is oriented to person, place, and time. She appears well-developed and well-nourished.  Pale Blood pressure 110/70 sitting.  No change with standing  HENT:  Head: Normocephalic.  Right Ear: External ear normal.  Left Ear: External ear normal.  Mouth/Throat: Oropharynx is clear and moist.  Eyes: Conjunctivae and EOM are normal. Pupils are equal, round, and reactive to light.  Neck: Normal range of motion. Neck supple. No thyromegaly present.  Cardiovascular: Normal rate, regular rhythm, normal heart sounds and intact distal pulses.   Pulmonary/Chest: Effort normal and breath sounds normal.  Abdominal: Soft. Bowel sounds are normal. She exhibits no mass. There is no tenderness.  Musculoskeletal: Normal  range of motion.  Lymphadenopathy:    She has no cervical adenopathy.  Neurological: She is alert and oriented to person, place, and time.  Skin: Skin is warm and dry. No rash noted.  Psychiatric: She has a normal mood and affect. Her behavior is normal.          Assessment & Plan:   Hypotension.  We'll continue to hold lisinopril.  Will force fluids History of significant postop anemia.  Patient does appear pale.  Will check a CBC and start iron supplementation  Continue home blood pressure monitoring Force fluids  Dyslipidemia.  Patient now on statin therapy.  We'll follow a lipid profile  Recheck 4 weeks  Nyoka Cowden, MD

## 2015-09-21 NOTE — Progress Notes (Signed)
Pre visit review using our clinic review tool, if applicable. No additional management support is needed unless otherwise documented below in the visit note. 

## 2015-09-22 LAB — CBC WITH DIFFERENTIAL/PLATELET
BASOS PCT: 0.1 % (ref 0.0–3.0)
Basophils Absolute: 0 10*3/uL (ref 0.0–0.1)
EOS ABS: 0.1 10*3/uL (ref 0.0–0.7)
EOS PCT: 3.4 % (ref 0.0–5.0)
HCT: 35 % — ABNORMAL LOW (ref 36.0–46.0)
Hemoglobin: 11.8 g/dL — ABNORMAL LOW (ref 12.0–15.0)
LYMPHS ABS: 1.2 10*3/uL (ref 0.7–4.0)
Lymphocytes Relative: 27.9 % (ref 12.0–46.0)
MCHC: 33.8 g/dL (ref 30.0–36.0)
MCV: 89.4 fl (ref 78.0–100.0)
MONO ABS: 0.4 10*3/uL (ref 0.1–1.0)
Monocytes Relative: 8.4 % (ref 3.0–12.0)
NEUTROS ABS: 2.6 10*3/uL (ref 1.4–7.7)
NEUTROS PCT: 60.2 % (ref 43.0–77.0)
PLATELETS: 221 10*3/uL (ref 150.0–400.0)
RBC: 3.91 Mil/uL (ref 3.87–5.11)
RDW: 13.9 % (ref 11.5–15.5)
WBC: 4.4 10*3/uL (ref 4.0–10.5)

## 2015-09-22 LAB — LIPID PANEL
Cholesterol: 167 mg/dL (ref 0–200)
HDL: 44.6 mg/dL (ref >39.00)
NonHDL: 122.08
TRIGLYCERIDES: 270 mg/dL — AB (ref 0.0–149.0)
Total CHOL/HDL Ratio: 4
VLDL: 54 mg/dL — AB (ref 0.0–40.0)

## 2015-09-22 LAB — LDL CHOLESTEROL, DIRECT: Direct LDL: 90 mg/dL

## 2015-10-05 ENCOUNTER — Telehealth: Payer: Self-pay | Admitting: Internal Medicine

## 2015-10-05 NOTE — Telephone Encounter (Signed)
Spoke with patient. She states she is exhausted, has no energy, and doesn't want to be around anyone or do anything. She stated she didn't understand her anemia diagnosis so we reviewed over her hemoglobin trend. She then verbalized understanding. She states she has a follow up appointment in 2 weeks.

## 2015-10-05 NOTE — Telephone Encounter (Signed)
°  Pt call concerning her lab results and would a call back to discuss.    [phone]

## 2015-10-15 ENCOUNTER — Ambulatory Visit (INDEPENDENT_AMBULATORY_CARE_PROVIDER_SITE_OTHER): Payer: BLUE CROSS/BLUE SHIELD | Admitting: Internal Medicine

## 2015-10-15 ENCOUNTER — Encounter: Payer: Self-pay | Admitting: Internal Medicine

## 2015-10-15 VITALS — BP 110/80 | HR 85 | Temp 98.5°F | Ht 65.0 in | Wt 125.0 lb

## 2015-10-15 DIAGNOSIS — E038 Other specified hypothyroidism: Secondary | ICD-10-CM | POA: Diagnosis not present

## 2015-10-15 DIAGNOSIS — I10 Essential (primary) hypertension: Secondary | ICD-10-CM

## 2015-10-15 NOTE — Progress Notes (Signed)
Subjective:    Patient ID: Cassandra Mcfarland, female    DOB: 1951-02-09, 65 y.o.   MRN: [id number]  HPI 65 year old patient who is seen today for follow-up.  She has had some postop anemia which has improved.  Since her last visit here, she has progressed very nicely and feels quite well.  She has a history of hypertension and lisinopril has been on hold.  Blood pressure remained fairly well controlled although a bit labile.  Past Medical History:  Diagnosis Date  . Anxiety   . Complication of anesthesia   . Depression   . History of urinary tract infection   . Hypertension   . Osteoarthritis   . PONV (postoperative nausea and vomiting)   . Situational anxiety 09/2003  . Situational depression   . Thyroid disease    hyper parathyroidism     Social History   Social History  . Marital status: Married    Spouse name: N/A  . Number of children: N/A  . Years of education: N/A   Occupational History  . Not on file.   Social History Main Topics  . Smoking status: Never Smoker  . Smokeless tobacco: Never Used  . Alcohol use Yes     Comment: occas glass of wine   . Drug use: No  . Sexual activity: Yes    Partners: Male    Birth control/ protection: Post-menopausal   Other Topics Concern  . Not on file   Social History Narrative  . No narrative on file    Past Surgical History:  Procedure Laterality Date  . abddominoplasty  1995  . APPENDECTOMY  Age 44  . BREAST REDUCTION SURGERY  09/2001   & Lift  . PARATHYROIDECTOMY  5/1/112   due to hypercalcemia  . SHOULDER SURGERY     left rotator cuff; bicep tendon repair on left   . THUMB FUSION     Dr. Loney Loh  . THUMB FUSION  08/2007   with bone graft- post trauma  . toatal knee replacement Right 2001  . TOTAL KNEE ARTHROPLASTY Left 08/02/2015   Procedure: LEFT TOTAL KNEE ARTHROPLASTY;  Surgeon: Gaynelle Arabian, MD;  Location: [facility];  Service: Orthopedics;  Laterality: Left;  . TUBAL LIGATION  1994    Family History    Problem Relation Age of Onset  . Coronary artery disease Father   . Heart disease Father   . Heart attack Father   . Coronary artery disease Brother   . Hypertension Brother   . Cancer Mother   . Leukemia Mother 47  . Osteoporosis Mother   . Hypertension Sister   . Osteoporosis Sister   . Osteoporosis Maternal Aunt   . Hypertension Brother     Allergies  Allergen Reactions  . Ciprofloxacin Other (See Comments)    Severe burning, blisters on toes  . Codeine Nausea And Vomiting  . Tetanus-Diphtheria Toxoids Td Swelling    Current Outpatient Prescriptions on File Prior to Visit  Medication Sig Dispense Refill  . acetaminophen (TYLENOL) 500 MG tablet Take 1,000 mg by mouth every 6 (six) hours as needed (For pain.).    Marland Kitchen ALPRAZolam (XANAX) 0.5 MG tablet TAKE 1 TABLET BY MOUTH AT BEDTIME AS NEEDED (Patient taking differently: TAKE 1 TABLET BY MOUTH AT BEDTIME AS NEEDED FOR ANXIETY.) 90 tablet 1  . diazepam (VALIUM) 5 MG tablet Take 1 tablet (5 mg total) by mouth daily as needed for anxiety. 90 tablet 1  . methocarbamol (ROBAXIN) 500 MG  tablet Take 500 mg by mouth every 6 (six) hours as needed for muscle spasms.    . rosuvastatin (CRESTOR) 10 MG tablet 1 tablet twice weekly (Patient taking differently: Take 10 mg by mouth 2 (two) times a week. Take on Wednesdays and Saturdays.) 60 tablet 3  . traMADol (ULTRAM) 50 MG tablet Take 1-2 tablets (50-100 mg total) by mouth every 6 (six) hours as needed (mild pain). 80 tablet 1   Current Facility-Administered Medications on File Prior to Visit  Medication Dose Route Frequency Provider Last Rate Last Dose  . iron polysaccharides (NIFEREX) capsule 150 mg  150 mg Oral Daily Marletta Lor, MD        BP 110/80 (BP Location: Left Arm, Patient Position: Sitting, Cuff Size: Normal)   Pulse 85   Temp 98.5 F (36.9 C) (Oral)   Ht 5\' 5"  (1.651 m)   Wt 125 lb (56.7 kg)   LMP 11/10/2014 (Exact Date)   SpO2 97%   BMI 20.80 kg/m       Review of Systems  Constitutional: Negative.   HENT: Negative for congestion, dental problem, hearing loss, rhinorrhea, sinus pressure, sore throat and tinnitus.   Eyes: Negative for pain, discharge and visual disturbance.  Respiratory: Negative for cough and shortness of breath.   Cardiovascular: Negative for chest pain, palpitations and leg swelling.  Gastrointestinal: Negative for abdominal distention, abdominal pain, blood in stool, constipation, diarrhea, nausea and vomiting.  Genitourinary: Negative for difficulty urinating, dysuria, flank pain, frequency, hematuria, pelvic pain, urgency, vaginal bleeding, vaginal discharge and vaginal pain.  Musculoskeletal: Negative for arthralgias, gait problem and joint swelling.  Skin: Negative for rash.  Neurological: Negative for dizziness, syncope, speech difficulty, weakness, numbness and headaches.  Hematological: Negative for adenopathy.  Psychiatric/Behavioral: Negative for agitation, behavioral problems and dysphoric mood. The patient is not nervous/anxious.        Objective:   Physical Exam  Constitutional: She is oriented to person, place, and time. She appears well-developed and well-nourished.  Repeat blood pressure 120/84  HENT:  Head: Normocephalic.  Right Ear: External ear normal.  Left Ear: External ear normal.  Mouth/Throat: Oropharynx is clear and moist.  Eyes: Conjunctivae and EOM are normal. Pupils are equal, round, and reactive to light.  Neck: Normal range of motion. Neck supple. No thyromegaly present.  Cardiovascular: Normal rate, regular rhythm, normal heart sounds and intact distal pulses.   Pulmonary/Chest: Effort normal and breath sounds normal.  Abdominal: Soft. Bowel sounds are normal. She exhibits no mass. There is no tenderness.  Musculoskeletal: Normal range of motion.  Lymphadenopathy:    She has no cervical adenopathy.  Neurological: She is alert and oriented to person, place, and time.  Skin:  Skin is warm and dry. No rash noted.  Psychiatric: She has a normal mood and affect. Her behavior is normal.          Assessment & Plan:   History of hypertension.  Pressures have done well without medication.  We'll continue to monitor off therapy. We'll resume lisinopril if blood pressure readings are consistently greater than 140 over 90.  Otherwise, return in 6 months for follow-up Postop anemia, improved.  Discontinue iron therapy after one month  Nyoka Cowden, MD

## 2015-10-15 NOTE — Patient Instructions (Signed)
Limit your sodium (Salt) intake  Please check your blood pressure on a regular basis.  If it is consistently greater than 140/90, please resume lisinopril  Return in 6 months for follow-up

## 2015-10-19 ENCOUNTER — Telehealth: Payer: Self-pay | Admitting: Nurse Practitioner

## 2015-10-19 NOTE — Telephone Encounter (Signed)
Patient called and cancelled her AEX for 10/22/15. She has switched her care to another doctor's office. Recall (02) removed. FYI only.

## 2015-11-02 ENCOUNTER — Other Ambulatory Visit: Payer: Self-pay | Admitting: Internal Medicine

## 2015-11-18 ENCOUNTER — Telehealth: Payer: Self-pay | Admitting: Internal Medicine

## 2015-11-18 MED ORDER — EZETIMIBE 10 MG PO TABS: 10.0000 mg | ORAL_TABLET | Freq: Every day | ORAL | 1 refills | Status: DC

## 2015-11-18 NOTE — Telephone Encounter (Signed)
Generic Zetia  10 mg  , #90 one daily Suggest return office visit and lipid panel in 3 months

## 2015-11-18 NOTE — Telephone Encounter (Signed)
Pt would like to let you know that she has tried several different statins and at this present time Crestor is the one that she is on and she has been experiencing muscle cramps, so she has stopped taking it a week ago and she is not experiencing any leg,hip or foot cramps why being on the statins.  Pt states that she has been doing a lot more walking for exercise.   Pt would like to know if there anything else that you can try her on?

## 2015-11-18 NOTE — Telephone Encounter (Signed)
Spoke to pt, told her Dr.K said she can try Zetia 10 mg one tablet daily. Also return office visit and repeat lipid panel in 3 months. Pt verbalized understanding. Rx sent to pharmacy.

## 2015-11-18 NOTE — Telephone Encounter (Signed)
Please see message and advise 

## 2015-12-20 ENCOUNTER — Ambulatory Visit: Payer: No Typology Code available for payment source | Admitting: Nurse Practitioner

## 2016-01-11 ENCOUNTER — Other Ambulatory Visit: Payer: Self-pay | Admitting: Internal Medicine

## 2016-02-16 ENCOUNTER — Encounter: Payer: Self-pay | Admitting: Internal Medicine

## 2016-02-16 ENCOUNTER — Ambulatory Visit (INDEPENDENT_AMBULATORY_CARE_PROVIDER_SITE_OTHER): Payer: BLUE CROSS/BLUE SHIELD | Admitting: Internal Medicine

## 2016-02-16 VITALS — BP 140/90 | HR 77 | Temp 98.1°F | Ht 65.0 in | Wt 125.0 lb

## 2016-02-16 DIAGNOSIS — M159 Polyosteoarthritis, unspecified: Secondary | ICD-10-CM

## 2016-02-16 DIAGNOSIS — F329 Major depressive disorder, single episode, unspecified: Secondary | ICD-10-CM | POA: Insufficient documentation

## 2016-02-16 DIAGNOSIS — F32A Depression, unspecified: Secondary | ICD-10-CM | POA: Insufficient documentation

## 2016-02-16 DIAGNOSIS — E038 Other specified hypothyroidism: Secondary | ICD-10-CM | POA: Diagnosis not present

## 2016-02-16 DIAGNOSIS — M150 Primary generalized (osteo)arthritis: Secondary | ICD-10-CM | POA: Diagnosis not present

## 2016-02-16 DIAGNOSIS — F320 Major depressive disorder, single episode, mild: Secondary | ICD-10-CM

## 2016-02-16 DIAGNOSIS — E785 Hyperlipidemia, unspecified: Secondary | ICD-10-CM | POA: Diagnosis not present

## 2016-02-16 DIAGNOSIS — I10 Essential (primary) hypertension: Secondary | ICD-10-CM | POA: Diagnosis not present

## 2016-02-16 LAB — LIPID PANEL
CHOL/HDL RATIO: 3
CHOLESTEROL: 177 mg/dL (ref 0–200)
HDL: 64 mg/dL (ref >39.00)
LDL Cholesterol: 96 mg/dL (ref 0–99)
NONHDL: 113.22
Triglycerides: 88 mg/dL (ref 0.0–149.0)
VLDL: 17.6 mg/dL (ref 0.0–40.0)

## 2016-02-16 MED ORDER — ESCITALOPRAM OXALATE 5 MG PO TABS: 5.0000 mg | ORAL_TABLET | Freq: Every day | ORAL | 3 refills | Status: DC

## 2016-02-16 NOTE — Progress Notes (Signed)
Subjective:    Patient ID: Cassandra Mcfarland, female    DOB: [birth year], 65 y.o.   MRN: [id number]  HPI  65 year old patient who has a history of dyslipidemia.  LDL cholesterol has been as high as 226 in the past.  About 3 months ago.  Twice weekly.  Crestor was discontinued due to muscle aches and pains.  She was switched his area and initially did better, but presently continues to have muscle pain as well as cramping pain is aggravated by inactivity such as prolonged sitting and worse at night. She does have benefit from take tramadol. She complains of worsening depression.  She has little interest in daily activities and no longer goes to the gym.  She has had a history of depression in the past  Past Medical History:  Diagnosis Date  . Anxiety   . Complication of anesthesia   . Depression   . History of urinary tract infection   . Hypertension   . Osteoarthritis   . PONV (postoperative nausea and vomiting)   . Situational anxiety 09/2003  . Situational depression   . Thyroid disease    hyper parathyroidism     Social History   Social History  . Marital status: Married    Spouse name: N/A  . Number of children: N/A  . Years of education: N/A   Occupational History  . Not on file.   Social History Main Topics  . Smoking status: Never Smoker  . Smokeless tobacco: Never Used  . Alcohol use Yes     Comment: occas glass of wine   . Drug use: No  . Sexual activity: Yes    Partners: Male    Birth control/ protection: Post-menopausal   Other Topics Concern  . Not on file   Social History Narrative  . No narrative on file    Past Surgical History:  Procedure Laterality Date  . abddominoplasty  1995  . APPENDECTOMY  Age 65  . BREAST REDUCTION SURGERY  09/2001   & Lift  . PARATHYROIDECTOMY  5/1/112   due to hypercalcemia  . SHOULDER SURGERY     left rotator cuff; bicep tendon repair on left   . THUMB FUSION     Dr. Loney Loh  . THUMB FUSION  08/2007   with bone  graft- post trauma  . toatal knee replacement Right 2001  . TOTAL KNEE ARTHROPLASTY Left 08/02/2015   Procedure: LEFT TOTAL KNEE ARTHROPLASTY;  Surgeon: Gaynelle Arabian, MD;  Location: [facility];  Service: Orthopedics;  Laterality: Left;  . TUBAL LIGATION  1994    Family History  Problem Relation Age of Onset  . Coronary artery disease Father   . Heart disease Father   . Heart attack Father   . Coronary artery disease Brother   . Hypertension Brother   . Cancer Mother   . Leukemia Mother 3  . Osteoporosis Mother   . Hypertension Sister   . Osteoporosis Sister   . Osteoporosis Maternal Aunt   . Hypertension Brother     Allergies  Allergen Reactions  . Ciprofloxacin Other (See Comments)    Severe burning, blisters on toes  . Codeine Nausea And Vomiting  . Tetanus-Diphtheria Toxoids Td Swelling    Current Outpatient Prescriptions on File Prior to Visit  Medication Sig Dispense Refill  . acetaminophen (TYLENOL) 500 MG tablet Take 1,000 mg by mouth every 6 (six) hours as needed (For pain.).    Marland Kitchen ALPRAZolam (XANAX) 0.5 MG tablet TAKE 1  TABLET BY MOUTH AT BEDTIME AS NEEDED 90 tablet 1  . diazepam (VALIUM) 5 MG tablet TAKE 1 TABLET BY MOUTH DAILY AS NEEDED FOR ANXIETY 90 tablet 0  . ezetimibe (ZETIA) 10 MG tablet Take 1 tablet (10 mg total) by mouth daily. 90 tablet 1  . traMADol (ULTRAM) 50 MG tablet Take 1-2 tablets (50-100 mg total) by mouth every 6 (six) hours as needed (mild pain). 80 tablet 1   No current facility-administered medications on file prior to visit.     BP 140/90 (BP Location: Left Arm, Patient Position: Sitting, Cuff Size: Normal)   Pulse 77   Temp 98.1 F (36.7 C) (Oral)   Ht 5\' 5"  (1.651 m)   Wt 125 lb (56.7 kg)   LMP 11/10/2014 (Exact Date)   BMI 20.80 kg/m     Review of Systems  Constitutional: Negative.   HENT: Negative for congestion, dental problem, hearing loss, rhinorrhea, sinus pressure, sore throat and tinnitus.   Eyes: Negative for pain,  discharge and visual disturbance.  Respiratory: Negative for cough and shortness of breath.   Cardiovascular: Negative for chest pain, palpitations and leg swelling.  Gastrointestinal: Negative for abdominal distention, abdominal pain, blood in stool, constipation, diarrhea, nausea and vomiting.  Genitourinary: Negative for difficulty urinating, dysuria, flank pain, frequency, hematuria, pelvic pain, urgency, vaginal bleeding, vaginal discharge and vaginal pain.  Musculoskeletal: Positive for arthralgias and myalgias. Negative for gait problem and joint swelling.  Skin: Negative for rash.  Neurological: Negative for dizziness, syncope, speech difficulty, weakness, numbness and headaches.  Hematological: Negative for adenopathy.  Psychiatric/Behavioral: Negative for agitation, behavioral problems and dysphoric mood. The patient is not nervous/anxious.        Objective:   Physical Exam  Constitutional: She is oriented to person, place, and time. She appears well-developed and well-nourished.  HENT:  Head: Normocephalic.  Right Ear: External ear normal.  Left Ear: External ear normal.  Mouth/Throat: Oropharynx is clear and moist.  Eyes: Conjunctivae and EOM are normal. Pupils are equal, round, and reactive to light.  Neck: Normal range of motion. Neck supple. No thyromegaly present.  Cardiovascular: Normal rate, regular rhythm, normal heart sounds and intact distal pulses.   Pulmonary/Chest: Effort normal and breath sounds normal.  Abdominal: Soft. Bowel sounds are normal. She exhibits no mass. There is no tenderness.  Musculoskeletal: Normal range of motion.  Lymphadenopathy:    She has no cervical adenopathy.  Neurological: She is alert and oriented to person, place, and time.  Skin: Skin is warm and dry. No rash noted.  Psychiatric: She has a normal mood and affect. Her behavior is normal.          Assessment & Plan:   Clinical depression.  We'll start Lexapro 5.  Patient will  be out of insurance till age 89.  After the first of the year Dyslipidemia.  Only criterion for treatment is LDL cholesterol greater than 1 9.  He presently is on his area.  Will check lipid profile Myalgias.  This has persisted in spite of discontinuation of statin therapy.  She also has some associated cramping.  Will encourage her to get back to the gym with stretching and exercise with range of motion type activities  Recheck 2 months  Nyoka Cowden

## 2016-02-16 NOTE — Progress Notes (Signed)
Pre visit review using our clinic review tool, if applicable. No additional management support is needed unless otherwise documented below in the visit note. 

## 2016-02-16 NOTE — Patient Instructions (Addendum)
It is important that you exercise regularly, at least 20 minutes 3 to 4 times per week.  If you develop chest pain or shortness of breath seek  medical attention.  Heart healthy diet High Cholesterol High cholesterol is a condition in which the blood has high levels of a white, waxy, fat-like substance (cholesterol). The human body needs small amounts of cholesterol. The liver makes all the cholesterol that the body needs. Extra (excess) cholesterol comes from the food that we eat. Cholesterol is carried from the liver by the blood through the blood vessels. If you have high cholesterol, deposits (plaques) may build up on the walls of your blood vessels (arteries). Plaques make the arteries narrower and stiffer. Cholesterol plaques increase your risk for heart attack and stroke. Work with your health care provider to keep your cholesterol levels in a healthy range. What increases the risk? This condition is more likely to develop in people who:  Eat foods that are high in animal fat (saturated fat) or cholesterol.  Are overweight.  Are not getting enough exercise.  Have a family history of high cholesterol. What are the signs or symptoms? There are no symptoms of this condition. How is this diagnosed? This condition may be diagnosed from the results of a blood test.  If you are older than age 29, your health care provider may check your cholesterol every 4-6 years.  You may be checked more often if you already have high cholesterol or other risk factors for heart disease. The blood test for cholesterol measures:  "Bad" cholesterol (LDL cholesterol). This is the main type of cholesterol that causes heart disease. The desired level for LDL is less than 100.  "Good" cholesterol (HDL cholesterol). This type helps to protect against heart disease by cleaning the arteries and carrying the LDL away. The desired level for HDL is 60 or higher.  Triglycerides. These are fats that the body can  store or burn for energy. The desired number for triglycerides is lower than 150.  Total cholesterol. This is a measure of the total amount of cholesterol in your blood, including LDL cholesterol, HDL cholesterol, and triglycerides. A healthy number is less than 200. How is this treated? This condition is treated with diet changes, lifestyle changes, and medicines. Diet changes  This may include eating more whole grains, fruits, vegetables, nuts, and fish.  This may also include cutting back on red meat and foods that have a lot of added sugar. Lifestyle changes  Changes may include getting at least 40 minutes of aerobic exercise 3 times a week. Aerobic exercises include walking, biking, and swimming. Aerobic exercise along with a healthy diet can help you maintain a healthy weight.  Changes may also include quitting smoking. Medicines  Medicines are usually given if diet and lifestyle changes have failed to reduce your cholesterol to healthy levels.  Your health care provider may prescribe a statin medicine. Statin medicines have been shown to reduce cholesterol, which can reduce the risk of heart disease. Follow these instructions at home: Eating and drinking If told by your health care provider:  Eat chicken (without skin), fish, veal, shellfish, ground [place] breast, and round or loin cuts of red meat.  Do not eat fried foods or fatty meats, such as hot dogs and salami.  Eat plenty of fruits, such as apples.  Eat plenty of vegetables, such as broccoli, potatoes, and carrots.  Eat beans, peas, and lentils.  Eat grains such as barley, rice, couscous, and bulgur  wheat.  Eat pasta without cream sauces.  Use skim or nonfat milk, and eat low-fat or nonfat yogurt and cheeses.  Do not eat or drink whole milk, cream, ice cream, egg yolks, or hard cheeses.  Do not eat stick margarine or tub margarines that contain trans fats (also called partially hydrogenated oils).  Do not eat  saturated tropical oils, such as coconut oil and palm oil.  Do not eat cakes, cookies, crackers, or other baked goods that contain trans fats. General instructions  Exercise as directed by your health care provider. Increase your activity level with activities such as gardening, walking, and taking the stairs.  Take over-the-counter and prescription medicines only as told by your health care provider.  Do not use any products that contain nicotine or tobacco, such as cigarettes and e-cigarettes. If you need help quitting, ask your health care provider.  Keep all follow-up visits as told by your health care provider. This is important. Contact a health care provider if:  You are struggling to maintain a healthy diet or weight.  You need help to start on an exercise program.  You need help to stop smoking. Get help right away if:  You have chest pain.  You have trouble breathing. This information is not intended to replace advice given to you by your health care provider. Make sure you discuss any questions you have with your health care provider. Document Released: 02/13/2005 Document Revised: 09/11/2015 Document Reviewed: 08/14/2015 Elsevier Interactive Patient Education  2017 [employer].  Major Depressive Disorder, Adult Major depressive disorder (MDD) is a mental health condition. MDD often makes you feel sad, hopeless, or helpless. MDD can also cause symptoms in your body. MDD can affect your:  Work.  School.  Relationships.  Other normal activities. MDD can range from mild to very bad. It may occur once (single episode MDD). It can also occur many times (recurrent MDD). The main symptoms of MDD often include:  Feeling sad, depressed, or irritable most of the time.  Loss of interest. MDD symptoms also include:  Sleeping too much or too little.  Eating too much or too little.  A change in your weight.  Feeling tired (fatigue) or having low energy.  Feeling  worthless.  Feeling guilty.  Trouble making decisions.  Trouble thinking clearly.  Thoughts of suicide or harming others.  Feeling weak.  Feeling agitated.  Keeping yourself from being around other people (isolation). Follow these instructions at home: Activity  Do these things as told by your doctor:  Go back to your normal activities.  Exercise regularly.  Spend time outdoors. Alcohol  Talk with your doctor about how alcohol can affect your antidepressant medicines.  Do not drink alcohol. Or, limit how much alcohol you drink.  This means no more than 1 drink a day for nonpregnant women and 2 drinks a day for men. One drink equals one of these:  12 oz of beer.  5 oz of wine.  1 oz of hard liquor. General instructions  Take over-the-counter and prescription medicines only as told by your doctor.  Eat a healthy diet.  Get plenty of sleep.  Find activities that you enjoy. Make time to do them.  Think about joining a support group. Your doctor may be able to suggest a group for you.  Keep all follow-up visits as told by your doctor. This is important. Where to find more information:  [employer] on Mental Illness:  www.nami.[place] of Mental  Health:  https://carter.com/  National Suicide Prevention Lifeline:  (365)322-5033. This is free, 24-hour help. Contact a doctor if:  Your symptoms get worse.  You have new symptoms. Get help right away if:  You self-harm.  You see, hear, taste, smell, or feel things that are not present (hallucinate). If you ever feel like you may hurt yourself or others, or have thoughts about taking your own life, get help right away. You can go to your nearest emergency department or call:  Your local emergency services (911 in the [place]  A suicide crisis helpline, such as the [place]:  (431)596-6872. This is open 24 hours a day. This information is not  intended to replace advice given to you by your health care provider. Make sure you discuss any questions you have with your health care provider. Document Released: 01/25/2015 Document Revised: 10/31/2015 Document Reviewed: 10/31/2015 Elsevier Interactive Patient Education  2017 [employer].     It is important that you exercise regularly, at least 20 minutes 3 to 4 times per week.  If you develop chest pain or shortness of breath seek  medical attention.

## 2016-05-21 ENCOUNTER — Other Ambulatory Visit: Payer: Self-pay | Admitting: Internal Medicine

## 2016-05-24 ENCOUNTER — Other Ambulatory Visit: Payer: Self-pay | Admitting: Internal Medicine

## 2016-08-28 ENCOUNTER — Other Ambulatory Visit: Payer: Self-pay | Admitting: Internal Medicine

## 2016-09-09 ENCOUNTER — Other Ambulatory Visit: Payer: Self-pay | Admitting: Internal Medicine

## 2016-10-04 DIAGNOSIS — D649 Anemia, unspecified: Secondary | ICD-10-CM | POA: Diagnosis not present

## 2016-10-04 DIAGNOSIS — Z1211 Encounter for screening for malignant neoplasm of colon: Secondary | ICD-10-CM | POA: Diagnosis not present

## 2016-10-04 DIAGNOSIS — N952 Postmenopausal atrophic vaginitis: Secondary | ICD-10-CM | POA: Diagnosis not present

## 2016-10-04 DIAGNOSIS — Z6821 Body mass index (BMI) 21.0-21.9, adult: Secondary | ICD-10-CM | POA: Diagnosis not present

## 2016-10-04 DIAGNOSIS — Z1231 Encounter for screening mammogram for malignant neoplasm of breast: Secondary | ICD-10-CM | POA: Diagnosis not present

## 2016-10-04 DIAGNOSIS — Z01411 Encounter for gynecological examination (general) (routine) with abnormal findings: Secondary | ICD-10-CM | POA: Diagnosis not present

## 2016-10-04 DIAGNOSIS — N8189 Other female genital prolapse: Secondary | ICD-10-CM | POA: Diagnosis not present

## 2016-10-12 DIAGNOSIS — K5904 Chronic idiopathic constipation: Secondary | ICD-10-CM | POA: Diagnosis not present

## 2016-10-12 DIAGNOSIS — Z1211 Encounter for screening for malignant neoplasm of colon: Secondary | ICD-10-CM | POA: Diagnosis not present

## 2016-10-12 DIAGNOSIS — K5730 Diverticulosis of large intestine without perforation or abscess without bleeding: Secondary | ICD-10-CM | POA: Diagnosis not present

## 2016-11-12 ENCOUNTER — Other Ambulatory Visit: Payer: Self-pay | Admitting: Internal Medicine

## 2016-11-13 ENCOUNTER — Telehealth: Payer: Self-pay | Admitting: *Deleted

## 2016-11-13 ENCOUNTER — Ambulatory Visit (INDEPENDENT_AMBULATORY_CARE_PROVIDER_SITE_OTHER): Payer: PPO | Admitting: Internal Medicine

## 2016-11-13 ENCOUNTER — Encounter: Payer: Self-pay | Admitting: Internal Medicine

## 2016-11-13 VITALS — BP 120/62 | HR 71 | Temp 98.1°F | Ht 65.0 in | Wt 124.2 lb

## 2016-11-13 DIAGNOSIS — K5792 Diverticulitis of intestine, part unspecified, without perforation or abscess without bleeding: Secondary | ICD-10-CM | POA: Diagnosis not present

## 2016-11-13 DIAGNOSIS — I10 Essential (primary) hypertension: Secondary | ICD-10-CM | POA: Diagnosis not present

## 2016-11-13 MED ORDER — ONDANSETRON HCL 4 MG PO TABS: 4.0000 mg | ORAL_TABLET | Freq: Three times a day (TID) | ORAL | 0 refills | Status: DC | PRN

## 2016-11-13 MED ORDER — METRONIDAZOLE 500 MG PO TABS: 500.0000 mg | ORAL_TABLET | Freq: Three times a day (TID) | ORAL | 0 refills | Status: DC

## 2016-11-13 MED ORDER — SULFAMETHOXAZOLE-TRIMETHOPRIM 800-160 MG PO TABS: 1.0000 | ORAL_TABLET | Freq: Two times a day (BID) | ORAL | 0 refills | Status: DC

## 2016-11-13 NOTE — Telephone Encounter (Signed)
Patient reports abdominal pain, nausea, bloating, decreased appetite, and belching since last Wednesday. Denies fever or vomiting. She is concerned for diverticulitis. She has a had a few episodes of diarrhea and mucous in her stool, but no bloody stools. She is able to drink fluids and is trying to stay hydrated. She is not overtly weak. Her symptoms improved briefly yesterday, but have returned today. She is requesting an appt to see PCP.  Appointment scheduled w/ PCP today at 1:30. Offered sooner appt w/ another provider but pt declined.

## 2016-11-13 NOTE — Progress Notes (Signed)
Subjective:    Patient ID: Cassandra Mcfarland, female    DOB: 03-14-1950, 66 y.o.   MRN: [id number]  HPI  66 year old patient who has a history of diverticulosis.  She was scheduled for her ten-year follow-up colonoscopy in 2 days, but this has been canceled due to the onset of abdominal pain. She had the onset of abdominal pain  5 days ago and initially was quite severe.  Pain has been fairly generalized but maximal in the left lower quadrant.  She has been on a light almost clear liquid diet and has improved over the weekend. Prominent nausea  but no vomiting She did have a normal gynecologic examination.  Last month.  She has had remote appendectomy at age 64.  She states that she has had similar pain in the past that lasted just 1 day.  Pain is alleviated by rest and aggravated by movement.  No fever, chills or vomiting.  Past Medical History:  Diagnosis Date  . Anxiety   . Complication of anesthesia   . Depression   . History of urinary tract infection   . Hypertension   . Osteoarthritis   . PONV (postoperative nausea and vomiting)   . Situational anxiety 09/2003  . Situational depression   . Thyroid disease    hyper parathyroidism     Social History   Social History  . Marital status: Married    Spouse name: N/A  . Number of children: N/A  . Years of education: N/A   Occupational History  . Not on file.   Social History Main Topics  . Smoking status: Never Smoker  . Smokeless tobacco: Never Used  . Alcohol use Yes     Comment: occas glass of wine   . Drug use: No  . Sexual activity: Yes    Partners: Male    Birth control/ protection: Post-menopausal   Other Topics Concern  . Not on file   Social History Narrative  . No narrative on file    Past Surgical History:  Procedure Laterality Date  . abddominoplasty  1995  . APPENDECTOMY  Age 69  . BREAST REDUCTION SURGERY  09/2001   & Lift  . PARATHYROIDECTOMY  5/1/112   due to hypercalcemia  . SHOULDER SURGERY      left rotator cuff; bicep tendon repair on left   . THUMB FUSION     Dr. Loney Loh  . THUMB FUSION  08/2007   with bone graft- post trauma  . toatal knee replacement Right 2001  . TOTAL KNEE ARTHROPLASTY Left 08/02/2015   Procedure: LEFT TOTAL KNEE ARTHROPLASTY;  Surgeon: Gaynelle Arabian, MD;  Location: [facility];  Service: Orthopedics;  Laterality: Left;  . TUBAL LIGATION  1994    Family History  Problem Relation Age of Onset  . Coronary artery disease Father   . Heart disease Father   . Heart attack Father   . Coronary artery disease Brother   . Hypertension Brother   . Cancer Mother   . Leukemia Mother 85  . Osteoporosis Mother   . Hypertension Sister   . Osteoporosis Sister   . Osteoporosis Maternal Aunt   . Hypertension Brother     Allergies  Allergen Reactions  . Ciprofloxacin Other (See Comments)    Severe burning, blisters on toes  . Codeine Nausea And Vomiting  . Tetanus-Diphtheria Toxoids Td Swelling    Current Outpatient Prescriptions on File Prior to Visit  Medication Sig Dispense Refill  . ALPRAZolam (XANAX) 0.5  MG tablet TAKE 1 TABLET BY MOUTH AT BEDTIME AS NEEDED 90 tablet 0  . diazepam (VALIUM) 5 MG tablet TAKE 1 TABLET BY MOUTH DAILY AS NEEDED FOR ANXIETY 90 tablet 0  . escitalopram (LEXAPRO) 5 MG tablet Take 1 tablet (5 mg total) by mouth daily. 90 tablet 3  . Estradiol 10 MCG TABS vaginal tablet INSERT 1 T VAGINALLY TWICE A WEEK HS  8  . ezetimibe (ZETIA) 10 MG tablet TAKE 1 TABLET(10 MG) BY MOUTH DAILY 90 tablet 0  . traMADol (ULTRAM) 50 MG tablet Take 1-2 tablets (50-100 mg total) by mouth every 6 (six) hours as needed (mild pain). (Patient not taking: Reported on 11/13/2016) 80 tablet 1   No current facility-administered medications on file prior to visit.     BP 120/62 (BP Location: Left Arm, Patient Position: Sitting, Cuff Size: Normal)   Pulse 71   Temp 98.1 F (36.7 C) (Oral)   Ht 5\' 5"  (1.651 m)   Wt 124 lb 3.2 oz (56.3 kg)   LMP 11/10/2014  (Exact Date)   SpO2 97%   BMI 20.67 kg/m     Review of Systems  Constitutional: Positive for activity change, appetite change and fatigue. Negative for chills, diaphoresis and fever.  HENT: Negative for congestion, dental problem, hearing loss, rhinorrhea, sinus pressure, sore throat and tinnitus.   Eyes: Negative for pain, discharge and visual disturbance.  Respiratory: Negative for cough and shortness of breath.   Cardiovascular: Negative for chest pain, palpitations and leg swelling.  Gastrointestinal: Positive for abdominal pain and nausea. Negative for abdominal distention, blood in stool, constipation, diarrhea and vomiting.  Genitourinary: Negative for difficulty urinating, dysuria, flank pain, frequency, hematuria, pelvic pain, urgency, vaginal bleeding, vaginal discharge and vaginal pain.  Musculoskeletal: Negative for arthralgias, gait problem and joint swelling.  Skin: Negative for rash.  Neurological: Negative for dizziness, syncope, speech difficulty, weakness, numbness and headaches.  Hematological: Negative for adenopathy.  Psychiatric/Behavioral: Negative for agitation, behavioral problems and dysphoric mood. The patient is not nervous/anxious.        Objective:   Physical Exam  Constitutional: She is oriented to person, place, and time. She appears well-developed and well-nourished.  Blood pressure well controlled Pulse 70 Afebrile  HENT:  Head: Normocephalic.  Right Ear: External ear normal.  Left Ear: External ear normal.  Mouth/Throat: Oropharynx is clear and moist.  Appears well-hydrated  Eyes: Pupils are equal, round, and reactive to light. Conjunctivae and EOM are normal.  Neck: Normal range of motion. Neck supple. No thyromegaly present.  Cardiovascular: Normal rate, regular rhythm, normal heart sounds and intact distal pulses.   Pulmonary/Chest: Effort normal and breath sounds normal.  Abdominal: Soft. Bowel sounds are normal. She exhibits no distension  and no mass. There is tenderness. There is guarding. There is no rebound.  Active bowel sounds Mild generalized tenderness maximal in the lower quadrants, left greater than right Guarding present but no rebound tenderness  Musculoskeletal: Normal range of motion.  Lymphadenopathy:    She has no cervical adenopathy.  Neurological: She is alert and oriented to person, place, and time.  Skin: Skin is warm and dry. No rash noted.  Psychiatric: She has a normal mood and affect. Her behavior is normal.          Assessment & Plan:   Acute diverticulitis.  We'll not pursue imaging studies at this time since patient is improving and low risk for diverticular complications.  Patient to report any clinical worsening, such as fever  or worsening pain.  Abdominal/pelvic CT scan will be performed at that time Patient has a Cipro allergy.  Dual antibiotic therapy will include Septra DS and metronidazole.  Patient does have tramadol available at home Patient instructions dispensed.  We'll continue clear liquid diet today and slowly advance as tolerated The patient has been told to call her gastroenterologist in 2-4 weeks to reschedule screening colonoscopy  Nyoka Cowden

## 2016-11-13 NOTE — Patient Instructions (Addendum)

## 2016-11-16 ENCOUNTER — Telehealth: Payer: Self-pay | Admitting: Internal Medicine

## 2016-11-16 ENCOUNTER — Encounter: Payer: Self-pay | Admitting: Internal Medicine

## 2016-11-16 NOTE — Telephone Encounter (Signed)
Pt seen 9/17 and dx with diverticulitis. Pt states the pain is better, however, pt is still experiencing severe nausea. would like to know if this is normal, especially to go on this long.  Pt states she also has bloating in her stomach, not intestines.  Pt aware Dr Raliegh Ip is out of the office today, and will wait for a call on Friday.

## 2016-11-17 NOTE — Telephone Encounter (Signed)
Spoke with pt and asked her to decrease metronidazole to twice daily. Only take antibiotics with a light snack  Suggest taking nausea medication every 6-8 hours around-the-clock. Pt verbalized understanding.

## 2016-11-17 NOTE — Telephone Encounter (Signed)
Decrease metronidazole to twice daily only Take antibiotics with a light snack  Suggest taking nausea medication every 6-8 hours around-the-clock

## 2016-11-17 NOTE — Telephone Encounter (Signed)
Please advise 

## 2016-11-21 DIAGNOSIS — Z1211 Encounter for screening for malignant neoplasm of colon: Secondary | ICD-10-CM | POA: Diagnosis not present

## 2016-11-21 DIAGNOSIS — K5730 Diverticulosis of large intestine without perforation or abscess without bleeding: Secondary | ICD-10-CM | POA: Diagnosis not present

## 2016-11-21 DIAGNOSIS — K5904 Chronic idiopathic constipation: Secondary | ICD-10-CM | POA: Diagnosis not present

## 2016-12-06 DIAGNOSIS — H43813 Vitreous degeneration, bilateral: Secondary | ICD-10-CM | POA: Diagnosis not present

## 2016-12-06 DIAGNOSIS — H35371 Puckering of macula, right eye: Secondary | ICD-10-CM | POA: Diagnosis not present

## 2016-12-06 DIAGNOSIS — H25813 Combined forms of age-related cataract, bilateral: Secondary | ICD-10-CM | POA: Diagnosis not present

## 2016-12-06 LAB — HM DIABETES EYE EXAM

## 2016-12-11 ENCOUNTER — Other Ambulatory Visit: Payer: Self-pay | Admitting: Internal Medicine

## 2016-12-12 NOTE — Telephone Encounter (Signed)
Rx last filled 7.2.18 Last OV 10/2016  Please advise on refill

## 2016-12-12 NOTE — Telephone Encounter (Signed)
Okay for refill?  

## 2016-12-13 DIAGNOSIS — K5730 Diverticulosis of large intestine without perforation or abscess without bleeding: Secondary | ICD-10-CM | POA: Diagnosis not present

## 2016-12-13 DIAGNOSIS — Z1211 Encounter for screening for malignant neoplasm of colon: Secondary | ICD-10-CM | POA: Diagnosis not present

## 2017-01-08 DIAGNOSIS — H2511 Age-related nuclear cataract, right eye: Secondary | ICD-10-CM | POA: Diagnosis not present

## 2017-01-08 DIAGNOSIS — H25811 Combined forms of age-related cataract, right eye: Secondary | ICD-10-CM | POA: Diagnosis not present

## 2017-01-11 DIAGNOSIS — H35371 Puckering of macula, right eye: Secondary | ICD-10-CM | POA: Diagnosis not present

## 2017-01-11 DIAGNOSIS — H43811 Vitreous degeneration, right eye: Secondary | ICD-10-CM | POA: Diagnosis not present

## 2017-01-13 ENCOUNTER — Other Ambulatory Visit: Payer: Self-pay | Admitting: Internal Medicine

## 2017-01-25 DIAGNOSIS — H2512 Age-related nuclear cataract, left eye: Secondary | ICD-10-CM | POA: Diagnosis not present

## 2017-01-29 DIAGNOSIS — H2512 Age-related nuclear cataract, left eye: Secondary | ICD-10-CM | POA: Diagnosis not present

## 2017-03-08 ENCOUNTER — Other Ambulatory Visit: Payer: Self-pay | Admitting: Internal Medicine

## 2017-03-13 ENCOUNTER — Emergency Department (HOSPITAL_COMMUNITY): Payer: PPO

## 2017-03-13 ENCOUNTER — Other Ambulatory Visit: Payer: Self-pay

## 2017-03-13 ENCOUNTER — Emergency Department (HOSPITAL_COMMUNITY)
Admission: EM | Admit: 2017-03-13 | Discharge: 2017-03-13 | Disposition: A | Discharge status: Home / Self Care | Payer: PPO | Attending: Emergency Medicine | Admitting: Emergency Medicine

## 2017-03-13 ENCOUNTER — Encounter (HOSPITAL_COMMUNITY): Payer: Self-pay | Admitting: Emergency Medicine

## 2017-03-13 DIAGNOSIS — E039 Hypothyroidism, unspecified: Secondary | ICD-10-CM | POA: Insufficient documentation

## 2017-03-13 DIAGNOSIS — W19XXXA Unspecified fall, initial encounter: Secondary | ICD-10-CM

## 2017-03-13 DIAGNOSIS — Y9389 Activity, other specified: Secondary | ICD-10-CM | POA: Diagnosis not present

## 2017-03-13 DIAGNOSIS — Z79899 Other long term (current) drug therapy: Secondary | ICD-10-CM | POA: Insufficient documentation

## 2017-03-13 DIAGNOSIS — M1990 Unspecified osteoarthritis, unspecified site: Secondary | ICD-10-CM | POA: Diagnosis not present

## 2017-03-13 DIAGNOSIS — Y929 Unspecified place or not applicable: Secondary | ICD-10-CM | POA: Insufficient documentation

## 2017-03-13 DIAGNOSIS — Y999 Unspecified external cause status: Secondary | ICD-10-CM | POA: Insufficient documentation

## 2017-03-13 DIAGNOSIS — S6991XA Unspecified injury of right wrist, hand and finger(s), initial encounter: Secondary | ICD-10-CM | POA: Diagnosis not present

## 2017-03-13 DIAGNOSIS — I10 Essential (primary) hypertension: Secondary | ICD-10-CM | POA: Diagnosis not present

## 2017-03-13 DIAGNOSIS — Z96652 Presence of left artificial knee joint: Secondary | ICD-10-CM | POA: Insufficient documentation

## 2017-03-13 DIAGNOSIS — W2209XA Striking against other stationary object, initial encounter: Secondary | ICD-10-CM | POA: Diagnosis not present

## 2017-03-13 DIAGNOSIS — S52032A Displaced fracture of olecranon process with intraarticular extension of left ulna, initial encounter for closed fracture: Secondary | ICD-10-CM | POA: Diagnosis not present

## 2017-03-13 DIAGNOSIS — S52022A Displaced fracture of olecranon process without intraarticular extension of left ulna, initial encounter for closed fracture: Secondary | ICD-10-CM | POA: Diagnosis not present

## 2017-03-13 DIAGNOSIS — M79642 Pain in left hand: Secondary | ICD-10-CM | POA: Diagnosis not present

## 2017-03-13 DIAGNOSIS — S59912A Unspecified injury of left forearm, initial encounter: Secondary | ICD-10-CM | POA: Diagnosis present

## 2017-03-13 MED ORDER — MORPHINE SULFATE (PF) 4 MG/ML IV SOLN
4.0000 mg | INTRAVENOUS | Status: DC | PRN
  Administered 2017-03-13 (×2): 4 mg via INTRAMUSCULAR
  Filled 2017-03-13 (×2): qty 1

## 2017-03-13 MED ORDER — POVIDONE-IODINE 10 % EX SWAB: 2.0000 "application " | Freq: Once | CUTANEOUS | Status: DC

## 2017-03-13 MED ORDER — TRAMADOL HCL 50 MG PO TABS: 50.0000 mg | ORAL_TABLET | Freq: Four times a day (QID) | ORAL | 0 refills | Status: DC | PRN

## 2017-03-13 MED ORDER — FENTANYL CITRATE (PF) 100 MCG/2ML IJ SOLN
50.0000 ug | INTRAMUSCULAR | Status: DC | PRN
  Administered 2017-03-13: 50 ug via INTRAVENOUS
  Filled 2017-03-13: qty 2

## 2017-03-13 MED ORDER — CEFAZOLIN SODIUM-DEXTROSE 2-4 GM/100ML-% IV SOLN: 2.0000 g | INTRAVENOUS | Status: DC

## 2017-03-13 MED ORDER — CHLORHEXIDINE GLUCONATE 4 % EX LIQD
60.0000 mL | Freq: Once | CUTANEOUS | Status: DC
  Filled 2017-03-13: qty 60

## 2017-03-13 MED ORDER — MORPHINE SULFATE (PF) 4 MG/ML IV SOLN
4.0000 mg | Freq: Once | INTRAVENOUS | Status: AC
  Administered 2017-03-13: 4 mg via INTRAMUSCULAR
  Filled 2017-03-13: qty 1

## 2017-03-13 NOTE — ED Notes (Signed)
ED Provider at bedside. 

## 2017-03-13 NOTE — ED Triage Notes (Signed)
Onset today patient tripped while walking her dog and fell onto left elbow pain radiating to left hand. Radial pulse +2 tingling. History of left wrist surgery.

## 2017-03-13 NOTE — ED Provider Notes (Signed)
[facility] EMERGENCY DEPARTMENT Provider Note   CSN: [id number] Arrival date & time: 03/13/17  0846     History   Chief Complaint Chief Complaint  Patient presents with  . Arm Injury    HPI Cassandra Mcfarland is a 67 y.o. female who presents ED today for fall and left arm injury.  Patient states that she was walking her dog this morning when she got tangled up with the leash, falling with a outstretched hand and landing on her left elbow, arm and hand.  She denies any head trauma or loss of consciousness.  She denies any blood thinner use.  No nausea or vomiting since event.  No alcohol or drug use prior to fall that would alter her sense of awareness.  She is now holding her arm in a flexed position against her body with pain and swelling over the dorsal aspect of the elbow.  There is noted to be bruising.  She received fentanyl nasal spray on arrival with improvement of her symptoms from a 10/10 to a 7/10.  She has associated tingling along the ulnar aspect of her forearm into the third through fifth digits.  No numbness.  She is unable to range the joint.  HPI  Past Medical History:  Diagnosis Date  . Anxiety   . Complication of anesthesia   . Depression   . History of urinary tract infection   . Hypertension   . Osteoarthritis   . PONV (postoperative nausea and vomiting)   . Situational anxiety 09/2003  . Situational depression   . Thyroid disease    hyper parathyroidism    Patient Active Problem List   Diagnosis Date Noted  . Depression 02/16/2016  . Iron deficiency anemia 09/21/2015  . OA (osteoarthritis) of knee 08/02/2015  . Dyslipidemia 01/03/2012  . Hypertension 01/03/2012  . Hypothyroidism 10/29/2007  . HYPERCALCEMIA 10/29/2007  . MENOPAUSAL SYNDROME 10/29/2007  . Osteoarthritis 10/29/2007    Past Surgical History:  Procedure Laterality Date  . abddominoplasty  1995  . APPENDECTOMY  Age 63  . BREAST REDUCTION SURGERY  09/2001   & Lift  .  PARATHYROIDECTOMY  5/1/112   due to hypercalcemia  . SHOULDER SURGERY     left rotator cuff; bicep tendon repair on left   . THUMB FUSION     Dr. Loney Loh  . THUMB FUSION  08/2007   with bone graft- post trauma  . toatal knee replacement Right 2001  . TOTAL KNEE ARTHROPLASTY Left 08/02/2015   Procedure: LEFT TOTAL KNEE ARTHROPLASTY;  Surgeon: Gaynelle Arabian, MD;  Location: [facility];  Service: Orthopedics;  Laterality: Left;  . TUBAL LIGATION  1994    OB History    Gravida Para Term Preterm AB Living   2 2 2     2    SAB TAB Ectopic Multiple Live Births           1       Home Medications    Prior to Admission medications   Medication Sig Start Date End Date Taking? Authorizing Provider  ALPRAZolam Duanne Moron) 0.5 MG tablet TAKE 1 TABLET BY MOUTH AT BEDTIME AS NEEDED 12/12/16   Marletta Lor, MD  diazepam (VALIUM) 5 MG tablet TAKE 1 TABLET BY MOUTH EVERY DAY AS NEEDED FOR ANXIETY 03/09/17   Marletta Lor, MD  escitalopram (LEXAPRO) 5 MG tablet Take 1 tablet (5 mg total) by mouth daily. 02/16/16   Marletta Lor, MD  Estradiol 10 MCG TABS  vaginal tablet INSERT 1 T VAGINALLY TWICE A WEEK HS 01/21/16   [provider]  ezetimibe (ZETIA) 10 MG tablet TAKE 1 TABLET(10 MG) BY MOUTH DAILY 01/15/17   Marletta Lor, MD  metroNIDAZOLE (FLAGYL) 500 MG tablet Take 1 tablet (500 mg total) by mouth 3 (three) times daily. 11/13/16   Marletta Lor, MD  ondansetron (ZOFRAN) 4 MG tablet Take 1 tablet (4 mg total) by mouth every 8 (eight) hours as needed for nausea or vomiting. 11/13/16   Marletta Lor, MD  sulfamethoxazole-trimethoprim (BACTRIM DS,SEPTRA DS) 800-160 MG tablet Take 1 tablet by mouth 2 (two) times daily. 11/13/16   Marletta Lor, MD  traMADol (ULTRAM) 50 MG tablet Take 1-2 tablets (50-100 mg total) by mouth every 6 (six) hours as needed (mild pain). Patient not taking: Reported on 11/13/2016 08/03/15   Joelene Millin, PA-C    Family  History Family History  Problem Relation Age of Onset  . Coronary artery disease Father   . Heart disease Father   . Heart attack Father   . Coronary artery disease Brother   . Hypertension Brother   . Cancer Mother   . Leukemia Mother 66  . Osteoporosis Mother   . Hypertension Sister   . Osteoporosis Sister   . Hypertension Brother   . Osteoporosis Maternal Aunt     Social History Social History   Tobacco Use  . Smoking status: Never Smoker  . Smokeless tobacco: Never Used  Substance Use Topics  . Alcohol use: Yes    Comment: occas glass of wine   . Drug use: No     Allergies   Ciprofloxacin; Codeine; and Tetanus-diphtheria toxoids td   Review of Systems Review of Systems  All other systems reviewed and are negative.    Physical Exam Updated Vital Signs BP (!) 147/116 (BP Location: Right Arm)   Pulse 80   Temp 97.7 F (36.5 C) (Oral)   Resp (!) 24   Ht 5\' 5"  (1.651 m)   Wt 57.6 kg (127 lb)   LMP 11/10/2014 (Exact Date)   SpO2 100%   BMI 21.13 kg/m   Physical Exam  Constitutional: She appears well-developed and well-nourished.  HENT:  Head: Normocephalic and atraumatic.  Right Ear: External ear normal.  Left Ear: External ear normal.  Eyes: Conjunctivae are normal. Right eye exhibits no discharge. Left eye exhibits no discharge. No scleral icterus.  Neck: Normal range of motion and full passive range of motion without pain. Neck supple. No spinous process tenderness and no muscular tenderness present. No neck rigidity. Normal range of motion present.  Cardiovascular:  Pulses:      Radial pulses are 2+ on the left side.  Pulmonary/Chest: Effort normal. No respiratory distress.  Musculoskeletal:  Left elbow held in flexed position by patients side. There is no evidence of skin tear or opening. Skin is intact. There is significant swelling and ecchymosis along the bursa/olecranon process with noted tenderness.  Patient is unable to range the joint due  to pain.  There is no tenderness palpation of the forearm or wrist.  Left hand: No gross deformities, skin intact. Fingers appear normal. No TTP over flexor sheath. No TTP. No snuffbox TTP. Finger adduction/abduction intact with intact but limited 2/2 to pain.  Thumb opposition intact. Intact active and resisted ROM to flexion/extension at wrist, MCP, PIP and DIP of all fingers but limited 2/2 to pain.  FDS/FDP intact. Radial artery 2+ with <2sec cap refill. SILT  in M/U/R distributions.    Neurological: She is alert.  Skin: Skin is warm, dry and intact. Capillary refill takes less than 2 seconds. No pallor.  Psychiatric: She has a normal mood and affect.  Nursing note and vitals reviewed.    ED Treatments / Results  Labs (all labs ordered are listed, but only abnormal results are displayed) Labs Reviewed - No data to display  EKG  EKG Interpretation None       Radiology Dg Elbow Complete Left  Result Date: 03/13/2017 CLINICAL DATA:  The patient suffered a left elbow injury due to a trip and fall while walking a dog this morning. Pain. Initial encounter. EXAM: LEFT ELBOW - COMPLETE 3+ VIEW COMPARISON:  None. FINDINGS: The patient has an acute fracture of the olecranon with superior displacement of the olecranon of 1.1 cm. Marked soft tissue swelling posterior to the elbow is consistent with contusion and hemorrhagic olecranon bursitis. Elbow joint effusion is identified. There is some degenerative change about the elbow. IMPRESSION: Acute fracture of the olecranon with associated soft tissue swelling and hematoma. Electronically Signed   By: Inge Rise M.D.   On: 03/13/2017 10:19   Dg Wrist Complete Left  Result Date: 03/13/2017 CLINICAL DATA:  Right wrist injury due to a trip and fall while walking a dog this morning. Initial encounter. EXAM: LEFT WRIST - COMPLETE 3+ VIEW COMPARISON:  None. FINDINGS: No fracture or dislocation is identified. Marked dorsal tilt of the lunate  consistent with DISI is identified. The trapezium and scaphoid appear fused, possibly congenital. IMPRESSION: Negative for fracture. 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 Electronically Signed   By: Inge Rise M.D.   On: 03/13/2017 10:23   Dg Hand Complete Left  Result Date: 03/13/2017 CLINICAL DATA:  Left hand pain due to an injury suffered while walking a dog today. Initial encounter. EXAM: LEFT HAND - COMPLETE 3+ VIEW COMPARISON:  None. FINDINGS: No fracture is identified. The patient appears to be status post resection of the trapezium or congenital fusion of the trapezium and scaphoid. DISI scratch the dorsal tilt of the lunate consistent with DISI noted. IMPRESSION: No acute abnormality. Electronically Signed   By: Inge Rise M.D.   On: 03/13/2017 10:42    Procedures Procedures (including critical care time)  Medications Ordered in ED Medications  fentaNYL (SUBLIMAZE) injection 50 mcg (50 mcg Intravenous Given 03/13/17 0923)  chlorhexidine (HIBICLENS) 4 % liquid 4 application (4 application Topical Not Given 03/13/17 1444)  povidone-iodine 10 % swab 2 application (2 application Topical Not Given 03/13/17 1444)  ceFAZolin (ANCEF) IVPB 2g/100 mL premix (not administered)  morphine 4 MG/ML injection 4 mg (4 mg Intramuscular Given 03/13/17 1447)  morphine 4 MG/ML injection 4 mg (4 mg Intramuscular Given 03/13/17 1120)     Initial Impression / Assessment and Plan / ED Course  I have reviewed the triage vital signs and the nursing notes.  Pertinent labs & imaging results that were available during my care of the patient were reviewed by me and considered in my medical decision making (see chart for details).     67 year old right handed female presenting to the emergency department today for left elbow pain after mechanical fall.  There is no head trauma or loss of consciousness.  There is obvious  swelling, bruising and pain over the olecranon process of the left elbow.  X-rays done in triage show a olecranon fracture with superior displacement  of 1.1 cm with associated joint effusion.  There is a closed fracture.  She is neurovascularly intact.  Pain was managed in the emergency department. PA, Silvestre Gunner to see the patient. Appreciate the consult.   Appreciate consult from orthopedics.  Dr. Marcelino Scot to take the patient to the [facility] for fixation.  Patient was placed in a splint in the meantime.  She is to remain n.p.o. until then.  Patient was being transferred to the PACU and was informed surgery was made to Thursday.  I discussed this with Silvestre Gunner who advised the patient will receive a call tomorrow to have appointment set for surgery on Thursday.  He provided discharge paperwork instructions. Also advised patient to call Dr. Marcelino Scot if she is to not receive a call tomorrow.  Patient provided a short-term medication until she is able to have surgery.  She is neurovascular intact at this time.  Information regarding her diagnosis and care until that time were given.  Strict return precautions discussed.  She appears safe for discharge.  Final Clinical Impressions(s) / ED Diagnoses   Final diagnoses:  Closed fracture of olecranon process of left ulna, initial encounter    ED Discharge Orders        Ordered    traMADol (ULTRAM) 50 MG tablet  Every 6 hours PRN     03/13/17 1503       Lorelle Gibbs 03/13/17 1537    Daleen Bo, MD 03/13/17 1615

## 2017-03-13 NOTE — Discharge Instructions (Addendum)
Keep splint intact and dry.  Keep arm elevated whenever possible.  Ice pack to back of elbow for 30 minutes 4x/day.  No lifting, pushing, or pulling with left arm.  For pain control you may take: 800mg  of ibuprofen (that is usually four 200mg  over the counter pills) up to 3 times a day (please take with food) and acetaminophen 975mg  (this is 3 normal strength, 325mg , over the counter pills) up to four times a day. Please do not take more than this. Do not drink alcohol or combine with other medications that have acetaminophen or Ibuprofen as an ingredient (Read the labels!).    For breakthrough pain you may take Ultram. Do not drink alcohol drive or operate heavy machinery when taking. You are being provided a prescription for opiates (also known as narcotics) for pain control on an ?as needed? basis.  Opiates can be addictive and should only be used when absolutely necessary for pain control when other alternatives do not work.  We recommend you only use them for the recommended amount of time and only as prescribed.  Please do not take with other sedative medications or alcohol.  Please do not drive, operate machinery, or make important decisions while taking opiates.  Please note that these medications can be addictive and have high abuse potential.  Please keep these medications locked away from children, teenagers or any family members with history of substance abuse. Additionally, these medications may cause constipation - take over the counter stool softeners or add fiber to your diet to treat this (Metamucil, Psyllium Fiber, Colace, Miralax) Further refills will need to be obtained from your primary care doctor and will not be prescribed through the Emergency Department. You will test positive on most drug tests while taking this medication.

## 2017-03-13 NOTE — ED Notes (Addendum)
Foam brace attempted however could not bend elbow at this time.

## 2017-03-13 NOTE — Consult Note (Signed)
Reason for Consult:Left elbow fx Referring Physician: [name] is an 67 y.o. female.  HPI: Cassandra Mcfarland was walking her dog when she got tangled up with her and fell, landing on her left elbow. She had immediate pain. She came to the ED for evaluation and x-rays showed an olecranon fx and orthopedic surgery was consulted.  Past Medical History:  Diagnosis Date  . Anxiety   . Complication of anesthesia   . Depression   . History of urinary tract infection   . Hypertension   . Osteoarthritis   . PONV (postoperative nausea and vomiting)   . Situational anxiety 09/2003  . Situational depression   . Thyroid disease    hyper parathyroidism    Past Surgical History:  Procedure Laterality Date  . abddominoplasty  1995  . APPENDECTOMY  Age 71  . BREAST REDUCTION SURGERY  09/2001   & Lift  . PARATHYROIDECTOMY  5/1/112   due to hypercalcemia  . SHOULDER SURGERY     left rotator cuff; bicep tendon repair on left   . THUMB FUSION     Dr. Loney Loh  . THUMB FUSION  08/2007   with bone graft- post trauma  . toatal knee replacement Right 2001  . TOTAL KNEE ARTHROPLASTY Left 08/02/2015   Procedure: LEFT TOTAL KNEE ARTHROPLASTY;  Surgeon: Gaynelle Arabian, MD;  Location: [facility];  Service: Orthopedics;  Laterality: Left;  . TUBAL LIGATION  1994    Family History  Problem Relation Age of Onset  . Coronary artery disease Father   . Heart disease Father   . Heart attack Father   . Coronary artery disease Brother   . Hypertension Brother   . Cancer Mother   . Leukemia Mother 93  . Osteoporosis Mother   . Hypertension Sister   . Osteoporosis Sister   . Hypertension Brother   . Osteoporosis Maternal Aunt     Social History:  reports that  has never smoked. she has never used smokeless tobacco. She reports that she drinks alcohol. She reports that she does not use drugs.  Allergies:  Allergies  Allergen Reactions  . Ciprofloxacin Other (See Comments)    Severe burning, blisters  on toes  . Codeine Nausea And Vomiting  . Tetanus-Diphtheria Toxoids Td Swelling    Medications: I have reviewed the patient's current medications.  No results found for this or any previous visit (from the past 48 hour(s)).  Dg Elbow Complete Left  Result Date: 03/13/2017 CLINICAL DATA:  The patient suffered a left elbow injury due to a trip and fall while walking a dog this morning. Pain. Initial encounter. EXAM: LEFT ELBOW - COMPLETE 3+ VIEW COMPARISON:  None. FINDINGS: The patient has an acute fracture of the olecranon with superior displacement of the olecranon of 1.1 cm. Marked soft tissue swelling posterior to the elbow is consistent with contusion and hemorrhagic olecranon bursitis. Elbow joint effusion is identified. There is some degenerative change about the elbow. IMPRESSION: Acute fracture of the olecranon with associated soft tissue swelling and hematoma. Electronically Signed   By: Inge Rise M.D.   On: 03/13/2017 10:19   Dg Wrist Complete Left  Result Date: 03/13/2017 CLINICAL DATA:  Right wrist injury due to a trip and fall while walking a dog this morning. Initial encounter. EXAM: LEFT WRIST - COMPLETE 3+ VIEW COMPARISON:  None. FINDINGS: No fracture or dislocation is identified. Marked dorsal tilt of the lunate consistent with DISI is identified. The trapezium and scaphoid  appear fused, possibly congenital. IMPRESSION: Negative for fracture. 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 Electronically Signed   By: Inge Rise M.D.   On: 03/13/2017 10:23   Dg Hand Complete Left  Result Date: 03/13/2017 CLINICAL DATA:  Left hand pain due to an injury suffered while walking a dog today. Initial encounter. EXAM: LEFT HAND - COMPLETE 3+ VIEW COMPARISON:  None. FINDINGS: No fracture is identified. The patient appears to be status post resection of the trapezium or congenital fusion of the trapezium and  scaphoid. DISI scratch the dorsal tilt of the lunate consistent with DISI noted. IMPRESSION: No acute abnormality. Electronically Signed   By: Inge Rise M.D.   On: 03/13/2017 10:42    Review of Systems  Constitutional: Negative for weight loss.  HENT: Negative for ear discharge, ear pain, hearing loss and tinnitus.   Eyes: Negative for blurred vision, double vision, photophobia and pain.  Respiratory: Negative for cough, sputum production and shortness of breath.   Cardiovascular: Negative for chest pain.  Gastrointestinal: Negative for abdominal pain, nausea and vomiting.  Genitourinary: Negative for dysuria, flank pain, frequency and urgency.  Musculoskeletal: Positive for joint pain (Left elbow, hand). Negative for back pain, falls, myalgias and neck pain.  Neurological: Negative for dizziness, tingling, sensory change, focal weakness, loss of consciousness and headaches.  Endo/Heme/Allergies: Does not bruise/bleed easily.  Psychiatric/Behavioral: Negative for depression, memory loss and substance abuse. The patient is not nervous/anxious.    Blood pressure (!) 147/116, pulse 80, temperature 97.7 F (36.5 C), temperature source Oral, resp. rate (!) 24, height 5\' 5"  (1.651 m), weight 57.6 kg (127 lb), last menstrual period 11/10/2014, SpO2 100 %. Physical Exam  Constitutional: She appears well-developed and well-nourished. No distress.  HENT:  Head: Normocephalic.  Eyes: Conjunctivae are normal. Right eye exhibits no discharge. Left eye exhibits no discharge. No scleral icterus.  Neck: Normal range of motion.  Cardiovascular: Normal rate and regular rhythm.  Respiratory: Effort normal. No respiratory distress.  Musculoskeletal:  Left shoulder, elbow, wrist, digits- no skin wounds, elbow severe swelling, ecchymosis, pain  Sens  Ax intact R/M/U paresthetic  Mot   Ax/ R/ PIN/ M/ AIN/ U intact but limited by pain  Rad 2+  Neurological: She is alert.  Skin: Skin is warm and dry.  She is not diaphoretic.  Psychiatric: She has a normal mood and affect. Her behavior is normal.    Assessment/Plan: Left olecranon fx -- Will plan on fixation today with Dr. Marcelino Scot. NPO until then. Splint in meantime.    [name] Abu, PA-C Orthopedic Surgery [phone] 03/13/2017, 11:48 AM

## 2017-03-13 NOTE — ED Notes (Signed)
Pt transported to PACU, informed surgery was moved to Thursday. Pt transported back to ED

## 2017-03-13 NOTE — Progress Notes (Signed)
Orthopedic Tech Progress Note Patient Details:  Cassandra Mcfarland 06-29-1950 [id number]  Ortho Devices Type of Ortho Device: Ace wrap, Long arm splint Ortho Device/Splint Location: lue Ortho Device/Splint Interventions: Application   Post Interventions Patient Tolerated: Well Instructions Provided: Care of device   [name] 03/13/2017, 12:56 PM As ordered by PA Ainsley Spinner

## 2017-03-13 NOTE — ED Notes (Signed)
Pt verbalized understanding of discharge paperwork, prescriptions and surgery appt.

## 2017-03-14 ENCOUNTER — Other Ambulatory Visit: Payer: Self-pay

## 2017-03-14 ENCOUNTER — Encounter (HOSPITAL_COMMUNITY): Payer: Self-pay | Admitting: *Deleted

## 2017-03-14 NOTE — Progress Notes (Signed)
Spoke with pt for pre-op call. Pt denies cardiac history, chest pain or sob. Pt states she has not had to be treated for HTN for over a year. Pt denies being diabetic.

## 2017-03-15 ENCOUNTER — Ambulatory Visit (HOSPITAL_COMMUNITY): Payer: PPO

## 2017-03-15 ENCOUNTER — Inpatient Hospital Stay (HOSPITAL_COMMUNITY): Payer: PPO | Admitting: Certified Registered Nurse Anesthetist

## 2017-03-15 ENCOUNTER — Ambulatory Visit (HOSPITAL_COMMUNITY)
Admission: RE | Admit: 2017-03-15 | Discharge: 2017-03-15 | Disposition: A | Discharge status: Home / Self Care | Payer: PPO | Source: Ambulatory Visit | Attending: Orthopedic Surgery | Admitting: Orthopedic Surgery

## 2017-03-15 ENCOUNTER — Encounter (HOSPITAL_COMMUNITY)
Admission: RE | Disposition: A | Discharge status: Home / Self Care | Payer: Self-pay | Source: Ambulatory Visit | Attending: Orthopedic Surgery

## 2017-03-15 ENCOUNTER — Encounter (HOSPITAL_COMMUNITY): Payer: Self-pay | Admitting: *Deleted

## 2017-03-15 DIAGNOSIS — Z881 Allergy status to other antibiotic agents status: Secondary | ICD-10-CM | POA: Diagnosis not present

## 2017-03-15 DIAGNOSIS — Z885 Allergy status to narcotic agent status: Secondary | ICD-10-CM | POA: Insufficient documentation

## 2017-03-15 DIAGNOSIS — W010XXA Fall on same level from slipping, tripping and stumbling without subsequent striking against object, initial encounter: Secondary | ICD-10-CM | POA: Diagnosis not present

## 2017-03-15 DIAGNOSIS — Z887 Allergy status to serum and vaccine status: Secondary | ICD-10-CM | POA: Insufficient documentation

## 2017-03-15 DIAGNOSIS — Z8249 Family history of ischemic heart disease and other diseases of the circulatory system: Secondary | ICD-10-CM | POA: Insufficient documentation

## 2017-03-15 DIAGNOSIS — I10 Essential (primary) hypertension: Secondary | ICD-10-CM | POA: Insufficient documentation

## 2017-03-15 DIAGNOSIS — S52022A Displaced fracture of olecranon process without intraarticular extension of left ulna, initial encounter for closed fracture: Secondary | ICD-10-CM | POA: Insufficient documentation

## 2017-03-15 DIAGNOSIS — Z96652 Presence of left artificial knee joint: Secondary | ICD-10-CM | POA: Diagnosis not present

## 2017-03-15 DIAGNOSIS — T148XXA Other injury of unspecified body region, initial encounter: Secondary | ICD-10-CM

## 2017-03-15 DIAGNOSIS — Z419 Encounter for procedure for purposes other than remedying health state, unspecified: Secondary | ICD-10-CM

## 2017-03-15 DIAGNOSIS — F419 Anxiety disorder, unspecified: Secondary | ICD-10-CM | POA: Diagnosis not present

## 2017-03-15 DIAGNOSIS — Z4789 Encounter for other orthopedic aftercare: Secondary | ICD-10-CM | POA: Diagnosis not present

## 2017-03-15 DIAGNOSIS — S52032A Displaced fracture of olecranon process with intraarticular extension of left ulna, initial encounter for closed fracture: Secondary | ICD-10-CM | POA: Diagnosis not present

## 2017-03-15 DIAGNOSIS — E039 Hypothyroidism, unspecified: Secondary | ICD-10-CM | POA: Diagnosis not present

## 2017-03-15 DIAGNOSIS — M1990 Unspecified osteoarthritis, unspecified site: Secondary | ICD-10-CM | POA: Diagnosis not present

## 2017-03-15 DIAGNOSIS — D509 Iron deficiency anemia, unspecified: Secondary | ICD-10-CM | POA: Diagnosis not present

## 2017-03-15 HISTORY — PX: ORIF ELBOW FRACTURE: SHX5031

## 2017-03-15 HISTORY — DX: Anemia, unspecified: D64.9

## 2017-03-15 LAB — CBC
HCT: 35.2 % — ABNORMAL LOW (ref 36.0–46.0)
Hemoglobin: 11.3 g/dL — ABNORMAL LOW (ref 12.0–15.0)
MCH: 29.9 pg (ref 26.0–34.0)
MCHC: 32.1 g/dL (ref 30.0–36.0)
MCV: 93.1 fL (ref 78.0–100.0)
PLATELETS: 146 10*3/uL — AB (ref 150–400)
RBC: 3.78 MIL/uL — AB (ref 3.87–5.11)
RDW: 13.6 % (ref 11.5–15.5)
WBC: 4.3 10*3/uL (ref 4.0–10.5)

## 2017-03-15 LAB — BASIC METABOLIC PANEL
Anion gap: 10 (ref 5–15)
BUN: 16 mg/dL (ref 6–20)
CALCIUM: 8.9 mg/dL (ref 8.9–10.3)
CO2: 21 mmol/L — ABNORMAL LOW (ref 22–32)
CREATININE: 0.8 mg/dL (ref 0.44–1.00)
Chloride: 106 mmol/L (ref 101–111)
GFR calc Af Amer: >60 mL/min (ref >60)
GLUCOSE: 86 mg/dL (ref 65–99)
Potassium: 3.8 mmol/L (ref 3.5–5.1)
SODIUM: 137 mmol/L (ref 135–145)

## 2017-03-15 SURGERY — OPEN REDUCTION INTERNAL FIXATION (ORIF) ELBOW/OLECRANON FRACTURE
Anesthesia: General | Site: Elbow | Laterality: Left

## 2017-03-15 SURGERY — OPEN REDUCTION INTERNAL FIXATION (ORIF) ELBOW/OLECRANON FRACTURE
Anesthesia: General | Laterality: Left

## 2017-03-15 MED ORDER — MIDAZOLAM HCL 2 MG/2ML IJ SOLN
INTRAMUSCULAR | Status: DC | PRN
  Administered 2017-03-15: 2 mg via INTRAVENOUS

## 2017-03-15 MED ORDER — SUGAMMADEX SODIUM 200 MG/2ML IV SOLN
INTRAVENOUS | Status: DC | PRN
  Administered 2017-03-15: 200 mg via INTRAVENOUS

## 2017-03-15 MED ORDER — FENTANYL CITRATE (PF) 100 MCG/2ML IJ SOLN
INTRAMUSCULAR | Status: DC
Start: 2017-03-15 — End: 2017-03-15
  Filled 2017-03-15: qty 2

## 2017-03-15 MED ORDER — ROCURONIUM BROMIDE 10 MG/ML (PF) SYRINGE
PREFILLED_SYRINGE | INTRAVENOUS | Status: DC | PRN
  Administered 2017-03-15: 50 mg via INTRAVENOUS
  Administered 2017-03-15: 20 mg via INTRAVENOUS

## 2017-03-15 MED ORDER — PHENYLEPHRINE HCL 10 MG/ML IJ SOLN
INTRAMUSCULAR | Status: DC | PRN
  Administered 2017-03-15: 120 ug via INTRAVENOUS
  Administered 2017-03-15: 80 ug via INTRAVENOUS
  Administered 2017-03-15: 120 ug via INTRAVENOUS

## 2017-03-15 MED ORDER — ONDANSETRON 4 MG PO TBDP: 4.0000 mg | ORAL_TABLET | Freq: Three times a day (TID) | ORAL | 0 refills | Status: DC | PRN

## 2017-03-15 MED ORDER — BUPIVACAINE HCL (PF) 0.25 % IJ SOLN
INTRAMUSCULAR | Status: DC | PRN
  Administered 2017-03-15: 10 mL

## 2017-03-15 MED ORDER — MIDAZOLAM HCL 2 MG/2ML IJ SOLN
INTRAMUSCULAR | Status: AC
  Filled 2017-03-15: qty 2

## 2017-03-15 MED ORDER — LACTATED RINGERS IV SOLN
INTRAVENOUS | Status: DC
  Administered 2017-03-15: 07:00:00 via INTRAVENOUS

## 2017-03-15 MED ORDER — LACTATED RINGERS IV SOLN
INTRAVENOUS | Status: DC | PRN
  Administered 2017-03-15 (×2): via INTRAVENOUS

## 2017-03-15 MED ORDER — FENTANYL CITRATE (PF) 250 MCG/5ML IJ SOLN
INTRAMUSCULAR | Status: DC | PRN
  Administered 2017-03-15 (×2): 50 ug via INTRAVENOUS
  Administered 2017-03-15: 100 ug via INTRAVENOUS

## 2017-03-15 MED ORDER — OXYCODONE-ACETAMINOPHEN 5-325 MG PO TABS: 1.0000 | ORAL_TABLET | Freq: Four times a day (QID) | ORAL | 0 refills | Status: DC | PRN

## 2017-03-15 MED ORDER — CEFAZOLIN SODIUM-DEXTROSE 2-3 GM-%(50ML) IV SOLR
INTRAVENOUS | Status: DC | PRN
  Administered 2017-03-15: 2 g via INTRAVENOUS

## 2017-03-15 MED ORDER — 0.9 % SODIUM CHLORIDE (POUR BTL) OPTIME
TOPICAL | Status: DC | PRN
  Administered 2017-03-15: 1000 mL

## 2017-03-15 MED ORDER — FENTANYL CITRATE (PF) 100 MCG/2ML IJ SOLN
25.0000 ug | INTRAMUSCULAR | Status: DC | PRN
  Administered 2017-03-15: 25 ug via INTRAVENOUS
  Administered 2017-03-15: 50 ug via INTRAVENOUS
  Administered 2017-03-15: 25 ug via INTRAVENOUS

## 2017-03-15 MED ORDER — BUPIVACAINE HCL (PF) 0.25 % IJ SOLN
INTRAMUSCULAR | Status: AC
  Filled 2017-03-15: qty 30

## 2017-03-15 MED ORDER — LIDOCAINE 2% (20 MG/ML) 5 ML SYRINGE
INTRAMUSCULAR | Status: DC | PRN
  Administered 2017-03-15: 60 mg via INTRAVENOUS

## 2017-03-15 MED ORDER — PROPOFOL 10 MG/ML IV BOLUS
INTRAVENOUS | Status: DC | PRN
  Administered 2017-03-15: 150 mg via INTRAVENOUS

## 2017-03-15 MED ORDER — METHOCARBAMOL 500 MG PO TABS: 500.0000 mg | ORAL_TABLET | Freq: Four times a day (QID) | ORAL | 0 refills | Status: DC | PRN

## 2017-03-15 MED ORDER — PROPOFOL 10 MG/ML IV BOLUS
INTRAVENOUS | Status: AC
  Filled 2017-03-15: qty 20

## 2017-03-15 MED ORDER — PHENYLEPHRINE HCL 10 MG/ML IJ SOLN
INTRAMUSCULAR | Status: DC | PRN
  Administered 2017-03-15: 25 ug/min via INTRAVENOUS

## 2017-03-15 MED ORDER — ONDANSETRON HCL 4 MG/2ML IJ SOLN
INTRAMUSCULAR | Status: DC | PRN
  Administered 2017-03-15: 4 mg via INTRAVENOUS

## 2017-03-15 MED ORDER — FENTANYL CITRATE (PF) 100 MCG/2ML IJ SOLN
INTRAMUSCULAR | Status: AC
  Filled 2017-03-15: qty 2

## 2017-03-15 MED ORDER — FENTANYL CITRATE (PF) 250 MCG/5ML IJ SOLN
INTRAMUSCULAR | Status: AC
  Filled 2017-03-15: qty 5

## 2017-03-15 MED ORDER — DEXAMETHASONE SODIUM PHOSPHATE 10 MG/ML IJ SOLN
INTRAMUSCULAR | Status: DC | PRN
  Administered 2017-03-15: 10 mg via INTRAVENOUS

## 2017-03-15 SURGICAL SUPPLY — 88 items
APL SKNCLS STERI-STRIP NONHPOA (GAUZE/BANDAGES/DRESSINGS)
BANDAGE ACE 3X5.8 VEL STRL LF (GAUZE/BANDAGES/DRESSINGS) ×2 IMPLANT
BANDAGE ACE 4X5 VEL STRL LF (GAUZE/BANDAGES/DRESSINGS) ×2 IMPLANT
BANDAGE ACE 6X5 VEL STRL LF (GAUZE/BANDAGES/DRESSINGS) ×1 IMPLANT
BENZOIN TINCTURE PRP APPL 2/3 (GAUZE/BANDAGES/DRESSINGS) IMPLANT
BIT DRILL 2.0 LNG QUCK RELEASE (BIT) ×1 IMPLANT
BIT DRILL 2.8 QUICK RELEASE (BIT) ×1 IMPLANT
BIT DRILL QUICK RELEASE 3.5MM (BIT) IMPLANT
BLADE AVERAGE 25X9 (BLADE) IMPLANT
BLADE CLIPPER SURG (BLADE) ×2 IMPLANT
BLADE SURG 10 STRL SS (BLADE) IMPLANT
BNDG CMPR 9X4 STRL LF SNTH (GAUZE/BANDAGES/DRESSINGS) ×1
BNDG COHESIVE 4X5 TAN STRL (GAUZE/BANDAGES/DRESSINGS) ×2 IMPLANT
BNDG ESMARK 4X9 LF (GAUZE/BANDAGES/DRESSINGS) ×2 IMPLANT
BNDG GAUZE ELAST 4 BULKY (GAUZE/BANDAGES/DRESSINGS) ×2 IMPLANT
BRUSH SCRUB SURG 4.25 DISP (MISCELLANEOUS) ×4 IMPLANT
CLEANER TIP ELECTROSURG 2X2 (MISCELLANEOUS) ×2 IMPLANT
COVER SURGICAL LIGHT HANDLE (MISCELLANEOUS) ×2 IMPLANT
CUFF TOURNIQUET SINGLE 18IN (TOURNIQUET CUFF) IMPLANT
CUFF TOURNIQUET SINGLE 24IN (TOURNIQUET CUFF) IMPLANT
DECANTER SPIKE VIAL GLASS SM (MISCELLANEOUS) IMPLANT
DRAPE C-ARM 42X72 X-RAY (DRAPES) ×1 IMPLANT
DRAPE C-ARMOR (DRAPES) ×2 IMPLANT
DRAPE INCISE IOBAN 66X45 STRL (DRAPES) ×1 IMPLANT
DRAPE U-SHAPE 47X51 STRL (DRAPES) ×2 IMPLANT
DRILL 2.0 LNG QUICK RELEASE (BIT) ×2
DRILL 2.8 QUICK RELEASE (BIT) ×2
DRILL QUICK RELEASE 3.5MM (BIT) ×2
DRSG ADAPTIC 3X8 NADH LF (GAUZE/BANDAGES/DRESSINGS) IMPLANT
DRSG EMULSION OIL 3X3 NADH (GAUZE/BANDAGES/DRESSINGS) IMPLANT
DRSG MEPITEL 4X7.2 (GAUZE/BANDAGES/DRESSINGS) ×1 IMPLANT
ELECT REM PT RETURN 9FT ADLT (ELECTROSURGICAL) ×2
ELECTRODE REM PT RTRN 9FT ADLT (ELECTROSURGICAL) ×1 IMPLANT
FACESHIELD WRAPAROUND (MASK) IMPLANT
FACESHIELD WRAPAROUND OR TEAM (MASK) IMPLANT
GAUZE SPONGE 4X4 12PLY STRL (GAUZE/BANDAGES/DRESSINGS) ×2 IMPLANT
GAUZE XEROFORM 1X8 LF (GAUZE/BANDAGES/DRESSINGS) IMPLANT
GAUZE XEROFORM 5X9 LF (GAUZE/BANDAGES/DRESSINGS) ×1 IMPLANT
GLOVE BIO SURGEON STRL SZ7.5 (GLOVE) ×2 IMPLANT
GLOVE BIO SURGEON STRL SZ8 (GLOVE) ×2 IMPLANT
GLOVE BIOGEL PI IND STRL 7.5 (GLOVE) ×1 IMPLANT
GLOVE BIOGEL PI IND STRL 8 (GLOVE) ×1 IMPLANT
GLOVE BIOGEL PI INDICATOR 7.5 (GLOVE) ×1
GLOVE BIOGEL PI INDICATOR 8 (GLOVE) ×1
GOWN STRL REUS W/ TWL LRG LVL3 (GOWN DISPOSABLE) ×2 IMPLANT
GOWN STRL REUS W/ TWL XL LVL3 (GOWN DISPOSABLE) ×1 IMPLANT
GOWN STRL REUS W/TWL LRG LVL3 (GOWN DISPOSABLE) ×4
GOWN STRL REUS W/TWL XL LVL3 (GOWN DISPOSABLE) ×2
GUIDEWIRE ORTH 6X062XTROC NS (WIRE) IMPLANT
K-WIRE .062 (WIRE) ×4
KIT BASIN OR (CUSTOM PROCEDURE TRAY) ×2 IMPLANT
KIT ROOM TURNOVER OR (KITS) ×2 IMPLANT
MANIFOLD NEPTUNE II (INSTRUMENTS) ×2 IMPLANT
NS IRRIG 1000ML POUR BTL (IV SOLUTION) ×2 IMPLANT
PACK ORTHO EXTREMITY (CUSTOM PROCEDURE TRAY) ×2 IMPLANT
PAD ARMBOARD 7.5X6 YLW CONV (MISCELLANEOUS) ×4 IMPLANT
PAD CAST 4YDX4 CTTN HI CHSV (CAST SUPPLIES) ×1 IMPLANT
PADDING CAST COTTON 4X4 STRL (CAST SUPPLIES)
PLATE LEFT OLECRANON 3HOLE (Plate) ×2 IMPLANT
SCREW HEXALOBE NON-LOCK 3.5X14 (Screw) ×1 IMPLANT
SCREW LOCK 18X2.7X HEXALOBE (Screw) ×2 IMPLANT
SCREW LOCK 24X3.5X HEXALOBE (Screw) ×1 IMPLANT
SCREW LOCKING 2.7X18MM (Screw) ×4 IMPLANT
SCREW LOCKING 3.5X24 (Screw) ×2 IMPLANT
SCREW NON LOCKING HEX 3.5X18MM (Screw) ×1 IMPLANT
SCREW NON LOCKING HEX 3.5X45 (Screw) ×2 IMPLANT
SLING ARM FOAM STRAP MED (SOFTGOODS) ×2 IMPLANT
SPONGE LAP 18X18 X RAY DECT (DISPOSABLE) ×2 IMPLANT
STAPLER VISISTAT 35W (STAPLE) ×2 IMPLANT
STOCKINETTE IMPERVIOUS LG (DRAPES) ×1 IMPLANT
STRIP CLOSURE SKIN 1/2X4 (GAUZE/BANDAGES/DRESSINGS) IMPLANT
SUCTION FRAZIER HANDLE 10FR (MISCELLANEOUS) ×1
SUCTION TUBE FRAZIER 10FR DISP (MISCELLANEOUS) ×1 IMPLANT
SUT ETHILON 3 0 PS 1 (SUTURE) ×1 IMPLANT
SUT PDS AB 2-0 CT1 27 (SUTURE) IMPLANT
SUT PROLENE 3 0 PS 2 (SUTURE) ×2 IMPLANT
SUT VIC AB 0 CT1 27 (SUTURE) ×2
SUT VIC AB 0 CT1 27XBRD ANBCTR (SUTURE) ×2 IMPLANT
SUT VIC AB 2-0 CT1 27 (SUTURE) ×2
SUT VIC AB 2-0 CT1 TAPERPNT 27 (SUTURE) ×2 IMPLANT
SUT VIC AB 2-0 CT3 27 (SUTURE) IMPLANT
SYR CONTROL 10ML LL (SYRINGE) ×2 IMPLANT
TOWEL OR 17X24 6PK STRL BLUE (TOWEL DISPOSABLE) IMPLANT
TOWEL OR 17X26 10 PK STRL BLUE (TOWEL DISPOSABLE) ×4 IMPLANT
TUBE CONNECTING 12X1/4 (SUCTIONS) ×2 IMPLANT
UNDERPAD 30X30 (UNDERPADS AND DIAPERS) ×2 IMPLANT
WATER STERILE IRR 1000ML POUR (IV SOLUTION) ×2 IMPLANT
YANKAUER SUCT BULB TIP NO VENT (SUCTIONS) ×2 IMPLANT

## 2017-03-15 NOTE — Anesthesia Procedure Notes (Signed)
Procedure Name: Intubation Date/Time: 10/11/2017 8:32 AM Performed by: Bryson Corona, CRNA Pre-anesthesia Checklist: Patient identified, Emergency Drugs available, Suction available and Patient being monitored Patient Re-evaluated:Patient Re-evaluated prior to induction Oxygen Delivery Method: Circle System Utilized Preoxygenation: Pre-oxygenation with 100% oxygen Induction Type: IV induction Ventilation: Mask ventilation without difficulty Laryngoscope Size: Mac and 3 Grade View: Grade I Tube type: Oral Tube size: 7.0 mm Number of attempts: 1 Airway Equipment and Method: Stylet Placement Confirmation: ETT inserted through vocal cords under direct vision,  positive ETCO2 and breath sounds checked- equal and bilateral Secured at: 22 cm Tube secured with: Tape Dental Injury: Teeth and Oropharynx as per pre-operative assessment

## 2017-03-15 NOTE — H&P (Addendum)
Reason for Consult:Left olecranon fx  Cassandra Mcfarland is an 67 y.o. female.  HPI: Cassandra Mcfarland was walking her dog when she got tangled up with her and fell, landing on her left elbow. She had immediate pain. She came to the ED for evaluation and x-rays showed an olecranon fx and orthopedic surgery was consulted.      Past Medical History:  Diagnosis Date  . Anxiety   . Complication of anesthesia   . Depression   . History of urinary tract infection   . Hypertension   . Osteoarthritis   . PONV (postoperative nausea and vomiting)   . Situational anxiety 09/2003  . Situational depression   . Thyroid disease    hyper parathyroidism         Past Surgical History:  Procedure Laterality Date  . abddominoplasty  1995  . APPENDECTOMY  Age 77  . BREAST REDUCTION SURGERY  09/2001   & Lift  . PARATHYROIDECTOMY  5/1/112   due to hypercalcemia  . SHOULDER SURGERY     left rotator cuff; bicep tendon repair on left   . THUMB FUSION     Dr. Loney Loh  . THUMB FUSION  08/2007   with bone graft- post trauma  . toatal knee replacement Right 2001  . TOTAL KNEE ARTHROPLASTY Left 08/02/2015   Procedure: LEFT TOTAL KNEE ARTHROPLASTY;  Surgeon: Gaynelle Arabian, MD;  Location: [facility];  Service: Orthopedics;  Laterality: Left;  . TUBAL LIGATION  1994         Family History  Problem Relation Age of Onset  . Coronary artery disease Father   . Heart disease Father   . Heart attack Father   . Coronary artery disease Brother   . Hypertension Brother   . Cancer Mother   . Leukemia Mother 34  . Osteoporosis Mother   . Hypertension Sister   . Osteoporosis Sister   . Hypertension Brother   . Osteoporosis Maternal Aunt     Social History:  reports that  has never smoked. she has never used smokeless tobacco. She reports that she drinks alcohol. She reports that she does not use drugs.  Allergies:       Allergies  Allergen Reactions  . Ciprofloxacin Other  (See Comments)    Severe burning, blisters on toes  . Codeine Nausea And Vomiting  . Tetanus-Diphtheria Toxoids Td Swelling    Medications: I have reviewed the patient's current medications.  LabResultsLast48Hours  No results found for this or any previous visit (from the past 48 hour(s)).     ImagingResults(Last48hours)  Dg Elbow Complete Left  Result Date: 03/13/2017 CLINICAL DATA:  The patient suffered a left elbow injury due to a trip and fall while walking a dog this morning. Pain. Initial encounter. EXAM: LEFT ELBOW - COMPLETE 3+ VIEW COMPARISON:  None. FINDINGS: The patient has an acute fracture of the olecranon with superior displacement of the olecranon of 1.1 cm. Marked soft tissue swelling posterior to the elbow is consistent with contusion and hemorrhagic olecranon bursitis. Elbow joint effusion is identified. There is some degenerative change about the elbow. IMPRESSION: Acute fracture of the olecranon with associated soft tissue swelling and hematoma. Electronically Signed   By: Inge Rise M.D.   On: 03/13/2017 10:19   Dg Wrist Complete Left  Result Date: 03/13/2017 CLINICAL DATA:  Right wrist injury due to a trip and fall while walking a dog this morning. Initial encounter. EXAM: LEFT WRIST - COMPLETE 3+ VIEW COMPARISON:  None.  FINDINGS: No fracture or dislocation is identified. Marked dorsal tilt of the lunate consistent with DISI is identified. The trapezium and scaphoid appear fused, possibly congenital. IMPRESSION: Negative for fracture. 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 Electronically Signed   By: Inge Rise M.D.   On: 03/13/2017 10:23   Dg Hand Complete Left  Result Date: 03/13/2017 CLINICAL DATA:  Left hand pain due to an injury suffered while walking a dog today. Initial encounter. EXAM: LEFT HAND - COMPLETE 3+ VIEW COMPARISON:  None. FINDINGS: No fracture is  identified. The patient appears to be status post resection of the trapezium or congenital fusion of the trapezium and scaphoid. DISI scratch the dorsal tilt of the lunate consistent with DISI noted. IMPRESSION: No acute abnormality. Electronically Signed   By: Inge Rise M.D.   On: 03/13/2017 10:42     Review of Systems  Constitutional: Negative for weight loss.  HENT: Negative for ear discharge, ear pain, hearing loss and tinnitus.   Eyes: Negative for blurred vision, double vision, photophobia and pain.  Respiratory: Negative for cough, sputum production and shortness of breath.   Cardiovascular: Negative for chest pain.  Gastrointestinal: Negative for abdominal pain, nausea and vomiting.  Genitourinary: Negative for dysuria, flank pain, frequency and urgency.  Musculoskeletal: Positive for joint pain (Left elbow, hand). Negative for back pain, falls, myalgias and neck pain.  Neurological: Negative for dizziness, tingling, sensory change, focal weakness, loss of consciousness and headaches.  Endo/Heme/Allergies: Does not bruise/bleed easily.  Psychiatric/Behavioral: Negative for depression, memory loss and substance abuse. The patient is not nervous/anxious.    VSS Height 5\' 5"  (1.651 m), weight 57.6 kg (127 lb), last menstrual period 11/10/2014, SpO2 100 %. Physical Exam  Constitutional: She appears well-developed and well-nourished. No distress.  HENT:  Head: Normocephalic.  Eyes: Conjunctivae are normal. Right eye exhibits no discharge. Left eye exhibits no discharge. No scleral icterus.  Neck: Normal range of motion.  Cardiovascular: Normal rate and regular rhythm.  Respiratory: Effort normal. No respiratory distress.  Musculoskeletal:  Left shoulder, elbow, wrist, digits- no skin wounds, elbow severe swelling, ecchymosis, pain             Sens  Ax intact R/M/U paresthetic             Mot   Ax/ R/ PIN/ M/ AIN/ U intact but limited by pain             Rad 2+   Neurological: She is alert.  Skin: Skin is warm and dry. She is not diaphoretic.  Psychiatric: She has a normal mood and affect. Her behavior is normal.    Assessment/Plan: Left olecranon fx, displaced and comminuted articular surface  I discussed with the patient and her husband the risks and benefits of surgery, including the possibility of infection, nerve injury, vessel injury, wound breakdown, arthritis, symptomatic hardware, DVT/ PE, loss of motion, malunion, nonunion, and need for further surgery among others.  She acknowledged these risks and wished to proceed.  Altamese [place], MD [facility] [phone] (p)

## 2017-03-15 NOTE — Anesthesia Preprocedure Evaluation (Signed)
Anesthesia Evaluation  Patient identified by MRN, date of birth, ID band Patient awake    Reviewed: Allergy & Precautions, NPO status , Patient's Chart, lab work & pertinent test results  History of Anesthesia Complications (+) PONV  Airway Mallampati: II  TM Distance: >3 FB     Dental   Pulmonary    breath sounds clear to auscultation       Cardiovascular hypertension,  Rhythm:Regular Rate:Normal     Neuro/Psych    GI/Hepatic negative GI ROS, Neg liver ROS,   Endo/Other  Hypothyroidism   Renal/GU      Musculoskeletal  (+) Arthritis ,   Abdominal   Peds  Hematology  (+) anemia ,   Anesthesia Other Findings   Reproductive/Obstetrics                             Anesthesia Physical Anesthesia Plan  ASA: III  Anesthesia Plan: General   Post-op Pain Management:    Induction: Intravenous  PONV Risk Score and Plan: 4 or greater and Treatment may vary due to age or medical condition, Ondansetron, Dexamethasone and Midazolam  Airway Management Planned: Oral ETT  Additional Equipment:   Intra-op Plan:   Post-operative Plan: Extubation in OR  Informed Consent: I have reviewed the patients History and Physical, chart, labs and discussed the procedure including the risks, benefits and alternatives for the proposed anesthesia with the patient or authorized representative who has indicated his/her understanding and acceptance.   Dental advisory given  Plan Discussed with: CRNA and Anesthesiologist  Anesthesia Plan Comments:         Anesthesia Quick Evaluation

## 2017-03-15 NOTE — Brief Op Note (Signed)
03/15/2017  10:29 AM  PATIENT:  Cassandra Mcfarland  67 y.o. female  PRE-OPERATIVE DIAGNOSIS:  fractured elbow  POST-OPERATIVE DIAGNOSIS:  fractured elbow  PROCEDURE:  Procedure(s): OPEN REDUCTION INTERNAL FIXATION (ORIF) ELBOW/OLECRANON FRACTURE (Left)  SURGEON:  Surgeon(s) and Role:    Altamese [place], MD - Primary  ASSISTANTS: PA student   ANESTHESIA:   general  EBL:  50 mL   BLOOD ADMINISTERED:none  DRAINS: none   LOCAL MEDICATIONS USED:  MARCAINE     SPECIMEN:  No Specimen  DISPOSITION OF SPECIMEN:  N/A  COUNTS:  YES  TOURNIQUET:  * No tourniquets in log *  DICTATION: .Other Dictation: Dictation Number [id number]  PLAN OF CARE: Discharge to home after PACU  PATIENT DISPOSITION:  PACU - hemodynamically stable.   Delay start of Pharmacological VTE agent (>24hrs) due to surgical blood loss or risk of bleeding: not applicable

## 2017-03-15 NOTE — Transfer of Care (Signed)
Immediate Anesthesia Transfer of Care Note  Patient: Cassandra Mcfarland  Procedure(s) Performed: OPEN REDUCTION INTERNAL FIXATION (ORIF) ELBOW/OLECRANON FRACTURE (Left Elbow)  Patient Location: PACU  Anesthesia Type:General  Level of Consciousness: awake, alert  and oriented  Airway & Oxygen Therapy: Patient Spontanous Breathing and Patient connected to nasal cannula oxygen  Post-op Assessment: Report given to RN and Post -op Vital signs reviewed and stable  Post vital signs: Reviewed and stable  Last Vitals:  Vitals:   03/15/17 0620 03/15/17 0956  BP: 121/66   Pulse: 67   Resp: 18   Temp: 36.7 C (P) 36.4 C  SpO2: 96%     Last Pain:  Vitals:   03/15/17 0620  TempSrc: Oral  PainSc:       Patients Stated Pain Goal: 3 (25/85/27 7824)  Complications: No apparent anesthesia complications

## 2017-03-15 NOTE — Discharge Instructions (Addendum)
[facility] Discharge Instructions   General Discharge Instructions  WEIGHT BEARING STATUS: Nonweightbearing Left elbow  RANGE OF MOTION/ACTIVITY: no active extension of left elbow (no straightening L elbow on your own) you can use other arm to help straighten elbow. Do not push up or off with L arm.   Unrestricted elbow flexion Unrestricted shoulder, forearm, wrist and hand motion   Wound Care: daily dressing changes starting on 03/17/2017. See below   Discharge Wound Care Instructions  Do NOT apply any ointments, solutions or lotions to pin sites or surgical wounds.  These prevent needed drainage and even though solutions like hydrogen peroxide kill bacteria, they also damage cells lining the pin sites that help fight infection.  Applying lotions or ointments can keep the wounds moist and can cause them to breakdown and open up as well. This can increase the risk for infection. When in doubt call the office.  Surgical incisions should be dressed daily.  If any drainage is noted, use one layer of adaptic, then gauze, Kerlix, and an ace wrap.  Once the incision is completely dry and without drainage, it may be left open to air out.  Showering may begin 36-48 hours later.  Cleaning gently with soap and water.  Traumatic wounds should be dressed daily as well.    One layer of adaptic, gauze, Kerlix, then ace wrap.  The adaptic can be discontinued once the draining has ceased    If you have a wet to dry dressing: wet the gauze with saline the squeeze as much saline out so the gauze is moist (not soaking wet), place moistened gauze over wound, then place a dry gauze over the moist one, followed by Kerlix wrap, then ace wrap.    Diet: as you were eating previously.  Can use over the counter stool softeners and bowel preparations, such as Miralax, to help with bowel movements.  Narcotics can be constipating.  Be sure to drink plenty of fluids  PAIN MEDICATION USE AND  EXPECTATIONS  You have likely been given narcotic medications to help control your pain.  After a traumatic event that results in an fracture (broken bone) with or without surgery, it is ok to use narcotic pain medications to help control one's pain.  We understand that everyone responds to pain differently and each individual patient will be evaluated on a regular basis for the continued need for narcotic medications. Ideally, narcotic medication use should last no more than 6-8 weeks (coinciding with fracture healing).   As a patient it is your responsibility as well to monitor narcotic medication use and report the amount and frequency you use these medications when you come to your office visit.   We would also advise that if you are using narcotic medications, you should take a dose prior to therapy to maximize you participation.  IF YOU ARE ON NARCOTIC MEDICATIONS IT IS NOT PERMISSIBLE TO OPERATE A MOTOR VEHICLE (MOTORCYCLE/CAR/TRUCK/MOPED) OR HEAVY MACHINERY DO NOT MIX NARCOTICS WITH OTHER CNS (CENTRAL NERVOUS SYSTEM) DEPRESSANTS SUCH AS ALCOHOL   STOP SMOKING OR USING NICOTINE PRODUCTS!!!!  As discussed nicotine severely impairs your body's ability to heal surgical and traumatic wounds but also impairs bone healing.  Wounds and bone heal by forming microscopic blood vessels (angiogenesis) and nicotine is a vasoconstrictor (essentially, shrinks blood vessels).  Therefore, if vasoconstriction occurs to these microscopic blood vessels they essentially disappear and are unable to deliver necessary nutrients to the healing tissue.  This is one modifiable factor that  you can do to dramatically increase your chances of healing your injury.    (This means no smoking, no nicotine gum, patches, etc)  DO NOT USE NONSTEROIDAL ANTI-INFLAMMATORY DRUGS (NSAID'S)  Using products such as Advil (ibuprofen), Aleve (naproxen), Motrin (ibuprofen) for additional pain control during fracture healing can delay and/or  prevent the healing response.  If you would like to take over the counter (OTC) medication, Tylenol (acetaminophen) is ok.  However, some narcotic medications that are given for pain control contain acetaminophen as well. Therefore, you should not exceed more than 4000 mg of tylenol in a day if you do not have liver disease.  Also note that there are may OTC medicines, such as cold medicines and allergy medicines that my contain tylenol as well.  If you have any questions about medications and/or interactions please ask your doctor/PA or your pharmacist.      ICE AND ELEVATE INJURED/OPERATIVE EXTREMITY  Using ice and elevating the injured extremity above your heart can help with swelling and pain control.  Icing in a pulsatile fashion, such as 20 minutes on and 20 minutes off, can be followed.    Do not place ice directly on skin. Make sure there is a barrier between to skin and the ice pack.    Using frozen items such as frozen peas works well as the conform nicely to the are that needs to be iced.  USE AN ACE WRAP OR TED HOSE FOR SWELLING CONTROL  In addition to icing and elevation, Ace wraps or TED hose are used to help limit and resolve swelling.  It is recommended to use Ace wraps or TED hose until you are informed to stop.    When using Ace Wraps start the wrapping distally (farthest away from the body) and wrap proximally (closer to the body)   Example: If you had surgery on your leg or thing and you do not have a splint on, start the ace wrap at the toes and work your way up to the thigh        If you had surgery on your upper extremity and do not have a splint on, start the ace wrap at your fingers and work your way up to the upper arm  IF YOU ARE IN A SPLINT OR CAST DO NOT [place]   If your splint gets wet for any reason please contact the office immediately. You may shower in your splint or cast as long as you keep it dry.  This can be done by wrapping in a cast cover or  garbage back (or similar)  Do Not stick any thing down your splint or cast such as pencils, money, or hangers to try and scratch yourself with.  If you feel itchy take benadryl as prescribed on the bottle for itching  IF YOU ARE IN A CAM BOOT (BLACK BOOT)  You may remove boot periodically. Perform daily dressing changes as noted below.  Wash the liner of the boot regularly and wear a sock when wearing the boot. It is recommended that you sleep in the boot until told otherwise  CALL THE OFFICE WITH ANY QUESTIONS OR CONCERNS: [phone]

## 2017-03-15 NOTE — Anesthesia Postprocedure Evaluation (Signed)
Anesthesia Post Note  Patient: Cassandra Mcfarland  Procedure(s) Performed: OPEN REDUCTION INTERNAL FIXATION (ORIF) ELBOW/OLECRANON FRACTURE (Left Elbow)     Patient location during evaluation: PACU Anesthesia Type: General Level of consciousness: awake Pain management: pain level controlled Vital Signs Assessment: post-procedure vital signs reviewed and stable Respiratory status: spontaneous breathing Cardiovascular status: stable Anesthetic complications: no    Last Vitals:  Vitals:   03/15/17 1130 03/15/17 1142  BP: (!) 149/78 (!) 132/96  Pulse:  72  Resp: 12 14  Temp:  (!) 36.2 C  SpO2:  96%    Last Pain:  Vitals:   03/15/17 1142  TempSrc:   PainSc: 0-No pain                 Eliel Dudding

## 2017-03-16 ENCOUNTER — Encounter (HOSPITAL_COMMUNITY): Payer: Self-pay | Admitting: Orthopedic Surgery

## 2017-03-16 NOTE — Op Note (Signed)
Cassandra Mcfarland, Cassandra Mcfarland                ACCOUNT NO.:  [id number]  MEDICAL RECORD NO.:  [id number]  LOCATION:                                 FACILITY:  PHYSICIAN:  Astrid Divine. Marcelino Scot, M.D.      DATE OF BIRTH:  DATE OF PROCEDURE:  03/15/2017 DATE OF DISCHARGE:                              OPERATIVE REPORT   PREOPERATIVE DIAGNOSIS:  Left olecranon comminuted fracture.  POSTOPERATIVE DIAGNOSIS:  Left olecranon comminuted fracture.  PROCEDURE:  ORIF of left olecranon using Acumed plating system.  ASSISTANT:  1. Ainsley Spinner, PA-C   2.PA student.  ANESTHESIA:  General.  COMPLICATIONS:  None.  TOURNIQUET:  None.  DISPOSITION:  To PACU.  CONDITION:  Stable.  BRIEF SUMMARY OF INDICATION FOR PROCEDURE:  Cassandra Mcfarland is a very pleasant 67 year old right-hand-dominant female, who sustained a left displaced and comminuted intraarticular fracture of her olecranon, tripping over a dog at home.  I did discuss her the risks and benefits of surgery including the potential for infection, nerve injury, vessel injury, malunion, nonunion, DVT, PE, and specifically prominent hardware that could be tender and subsequently require removal.  We also discussed heart attack, stroke and other anesthetic complications. After full discussion, she and her husband did wish to proceed.  BRIEF SUMMARY OF PROCEDURE:  The patient was taken to the operating room where general anesthesia was induced.  Her left upper extremity was prepped and draped in usual sterile fashion.  We began with a chlorhexidine scrub, followed by a Betadine scrub and paint.  A time-out was held.  The incision was marked over the proximal ulna and dissection carried carefully down to the periosteum, which was left intact to all fragments.  A curette lavage and suction were used to remove the hematoma.  I was able to use a sharp tenaculum supplemented with a drill hole in the proximal ulna to secure compression of the fragments,  making sure that the articular comminution went with the major fragments.  This was then pinned provisionally with 2 crossing K-wires.  After this was secured, an olecranon plate from the Acumed system was placed over the tip and the so-called home-run screw placed through the guide using standard fixation and over-drilling the near cortex for additional compression.  I was careful to avoid straying laterally, so there would not be any interference with the radial head or any associated block to motion.  I placed 2 additional screws proximally within the olecranon, leaving them short of the articular surface and then placed an additional screw distally.  This was placed eccentrically, so as to achieve additional compression.  Fracture site could be seen clearly to interdigitate with excellent compression on both the medial and lateral sides.  C-arm images in AP and lateral projections confirmed reduction of the articular surface without angulation or narrowing.  I then placed the 2 additional screws using a lock screw proximally and a standard screw in the most distal hole.  The wound was irrigated thoroughly. Final images were obtained.  The elbow taken through full range of motion from extension to flexion as well as full forearm rotation without any block or crepitus.  A standard layered closure  was performed with 0 Vicryl, 2-0 Vicryl, and 3-0 nylon.  Sterile gently compressive dressing was applied with Mepitel, 4x4s, Kerlix, and Webril.  No splint was used.  Ace wrap and sling were applied.  I did inject the skin with Marcaine prior to dressing application.  Also, I  had an assistant throughout, who helped to maintain exposure with protection of the ulnar nerve where appropriate and retraction.  PROGNOSIS:  The patient will be allowed gentle unrestricted range of motion, but without resistance for extension.  She will be in a sling for comfort.  I will plan to see her back in the  office for removal of sutures in 10 days.  She is at increased risk for hardware removal, given her thin body habitus and the inherent prominence of the plate at the tip of her elbow.     Astrid Divine. Marcelino Scot, M.D.   ______________________________ Astrid Divine. Marcelino Scot, M.D.    MHH/MEDQ  D:  03/15/2017  T:  03/15/2017  Job:  [id number]

## 2017-03-21 ENCOUNTER — Encounter: Payer: Self-pay | Admitting: Internal Medicine

## 2017-03-21 DIAGNOSIS — S52032D Displaced fracture of olecranon process with intraarticular extension of left ulna, subsequent encounter for closed fracture with routine healing: Secondary | ICD-10-CM | POA: Diagnosis not present

## 2017-04-06 ENCOUNTER — Other Ambulatory Visit: Payer: Self-pay | Admitting: Internal Medicine

## 2017-04-11 DIAGNOSIS — S52032D Displaced fracture of olecranon process with intraarticular extension of left ulna, subsequent encounter for closed fracture with routine healing: Secondary | ICD-10-CM | POA: Diagnosis not present

## 2017-04-14 ENCOUNTER — Other Ambulatory Visit: Payer: Self-pay | Admitting: Internal Medicine

## 2017-04-16 DIAGNOSIS — Z96652 Presence of left artificial knee joint: Secondary | ICD-10-CM | POA: Diagnosis not present

## 2017-04-16 DIAGNOSIS — M25562 Pain in left knee: Secondary | ICD-10-CM | POA: Diagnosis not present

## 2017-04-17 NOTE — Telephone Encounter (Signed)
Medication filled to pharmacy as requested. Welcome to Medicare Appt scheduled w/ PCP.

## 2017-04-20 ENCOUNTER — Other Ambulatory Visit: Payer: Self-pay

## 2017-04-20 NOTE — Telephone Encounter (Signed)
Alprazalam phoned in to pharmacy.

## 2017-04-26 DIAGNOSIS — S83412D Sprain of medial collateral ligament of left knee, subsequent encounter: Secondary | ICD-10-CM | POA: Diagnosis not present

## 2017-04-27 DIAGNOSIS — S83419A Sprain of medial collateral ligament of unspecified knee, initial encounter: Secondary | ICD-10-CM | POA: Insufficient documentation

## 2017-05-02 DIAGNOSIS — S52032D Displaced fracture of olecranon process with intraarticular extension of left ulna, subsequent encounter for closed fracture with routine healing: Secondary | ICD-10-CM | POA: Diagnosis not present

## 2017-05-14 ENCOUNTER — Ambulatory Visit (INDEPENDENT_AMBULATORY_CARE_PROVIDER_SITE_OTHER): Payer: PPO | Admitting: Internal Medicine

## 2017-05-14 ENCOUNTER — Encounter: Payer: Self-pay | Admitting: Internal Medicine

## 2017-05-14 VITALS — BP 158/80 | HR 78 | Temp 97.7°F | Ht 63.75 in | Wt 127.0 lb

## 2017-05-14 DIAGNOSIS — I10 Essential (primary) hypertension: Secondary | ICD-10-CM | POA: Diagnosis not present

## 2017-05-14 DIAGNOSIS — Z0000 Encounter for general adult medical examination without abnormal findings: Secondary | ICD-10-CM

## 2017-05-14 DIAGNOSIS — Z23 Encounter for immunization: Secondary | ICD-10-CM | POA: Diagnosis not present

## 2017-05-14 DIAGNOSIS — E785 Hyperlipidemia, unspecified: Secondary | ICD-10-CM

## 2017-05-14 DIAGNOSIS — E039 Hypothyroidism, unspecified: Secondary | ICD-10-CM

## 2017-05-14 DIAGNOSIS — M150 Primary generalized (osteo)arthritis: Secondary | ICD-10-CM

## 2017-05-14 DIAGNOSIS — M159 Polyosteoarthritis, unspecified: Secondary | ICD-10-CM

## 2017-05-14 LAB — COMPREHENSIVE METABOLIC PANEL
ALBUMIN: 4.4 g/dL (ref 3.5–5.2)
ALK PHOS: 87 U/L (ref 39–117)
ALT: 25 U/L (ref 0–35)
AST: 22 U/L (ref 0–37)
BILIRUBIN TOTAL: 0.7 mg/dL (ref 0.2–1.2)
BUN: 18 mg/dL (ref 6–23)
CALCIUM: 10.2 mg/dL (ref 8.4–10.5)
CHLORIDE: 102 meq/L (ref 96–112)
CO2: 26 mEq/L (ref 19–32)
CREATININE: 0.7 mg/dL (ref 0.40–1.20)
GFR: 89.09 mL/min (ref >60.00)
Glucose, Bld: 82 mg/dL (ref 70–99)
Potassium: 4 mEq/L (ref 3.5–5.1)
SODIUM: 140 meq/L (ref 135–145)
TOTAL PROTEIN: 6.9 g/dL (ref 6.0–8.3)

## 2017-05-14 LAB — CBC WITH DIFFERENTIAL/PLATELET
BASOS ABS: 0 10*3/uL (ref 0.0–0.1)
Basophils Relative: 0.6 % (ref 0.0–3.0)
EOS ABS: 0.1 10*3/uL (ref 0.0–0.7)
Eosinophils Relative: 1.1 % (ref 0.0–5.0)
HEMATOCRIT: 38.9 % (ref 36.0–46.0)
HEMOGLOBIN: 13 g/dL (ref 12.0–15.0)
LYMPHS PCT: 29.5 % (ref 12.0–46.0)
Lymphs Abs: 1.6 10*3/uL (ref 0.7–4.0)
MCHC: 33.3 g/dL (ref 30.0–36.0)
MCV: 88.5 fl (ref 78.0–100.0)
MONO ABS: 0.4 10*3/uL (ref 0.1–1.0)
Monocytes Relative: 7.9 % (ref 3.0–12.0)
Neutro Abs: 3.3 10*3/uL (ref 1.4–7.7)
Neutrophils Relative %: 60.9 % (ref 43.0–77.0)
Platelets: 199 10*3/uL (ref 150.0–400.0)
RBC: 4.4 Mil/uL (ref 3.87–5.11)
RDW: 13.3 % (ref 11.5–15.5)
WBC: 5.4 10*3/uL (ref 4.0–10.5)

## 2017-05-14 LAB — LIPID PANEL
CHOLESTEROL: 207 mg/dL — AB (ref 0–200)
HDL: 63.6 mg/dL (ref >39.00)
LDL CALC: 116 mg/dL — AB (ref 0–99)
NonHDL: 143.05
TRIGLYCERIDES: 133 mg/dL (ref 0.0–149.0)
Total CHOL/HDL Ratio: 3
VLDL: 26.6 mg/dL (ref 0.0–40.0)

## 2017-05-14 LAB — TSH

## 2017-05-14 MED ORDER — DIAZEPAM 5 MG PO TABS: 5.0000 mg | ORAL_TABLET | Freq: Two times a day (BID) | ORAL | 0 refills | Status: DC | PRN

## 2017-05-14 MED ORDER — EZETIMIBE 10 MG PO TABS: ORAL_TABLET | ORAL | 4 refills | Status: DC

## 2017-05-14 MED ORDER — ALPRAZOLAM 0.5 MG PO TABS: 0.5000 mg | ORAL_TABLET | Freq: Every evening | ORAL | 0 refills | Status: DC | PRN

## 2017-05-14 NOTE — Progress Notes (Signed)
Subjective:    Patient ID: Cassandra Mcfarland, female    DOB: 05-30-1950, 67 y.o.   MRN: [id number]  HPI  67 year old patient who is seen today for an initial welcome to Medicare visit.  She has been followed by orthopedics due to a recent fracture of the left olecranon.  She also has had a strain involving the left MCL.  She has a history of essential hypertension and mild dyslipidemia.  She remains on Zetia only  She has had surgery in the past by Dr. Harlow Asa for primary hyperparathyroidism  She is followed annually by gynecology  She does have a history of mild anxiety and insomnia she does take alprazolam at bedtime on a regular basis.  She takes diazepam rarely during the day for anxiety but does not take both benzodiazepines within the same 24-hour.  She has not taken diazepam in months  Past Medical History:  Diagnosis Date  . Anemia    after knee surgery  . Anxiety   . Complication of anesthesia   . Depression   . History of urinary tract infection   . Hypertension    no longer on medications  . Osteoarthritis   . PONV (postoperative nausea and vomiting)   . Situational anxiety 09/2003  . Situational depression   . Thyroid disease    hyper parathyroidism     Social History   Socioeconomic History  . Marital status: Married    Spouse name: Not on file  . Number of children: Not on file  . Years of education: Not on file  . Highest education level: Not on file  Social Needs  . Financial resource strain: Not on file  . Food insecurity - worry: Not on file  . Food insecurity - inability: Not on file  . Transportation needs - medical: Not on file  . Transportation needs - non-medical: Not on file  Occupational History  . Not on file  Tobacco Use  . Smoking status: Never Smoker  . Smokeless tobacco: Never Used  Substance and Sexual Activity  . Alcohol use: Yes    Comment: occas glass of wine   . Drug use: No  . Sexual activity: Yes    Partners: Male    Birth  control/protection: Post-menopausal  Other Topics Concern  . Not on file  Social History Narrative  . Not on file    Past Surgical History:  Procedure Laterality Date  . abddominoplasty  1995  . APPENDECTOMY  Age 89  . BREAST REDUCTION SURGERY  09/2001   & Lift  . COLONOSCOPY    . ORIF ELBOW FRACTURE Left 03/15/2017   Procedure: OPEN REDUCTION INTERNAL FIXATION (ORIF) ELBOW/OLECRANON FRACTURE;  Surgeon: Altamese [place], MD;  Location: [facility];  Service: Orthopedics;  Laterality: Left;  . PARATHYROIDECTOMY  5/1/112   due to hypercalcemia  . SHOULDER SURGERY     left rotator cuff; bicep tendon repair on left   . THUMB FUSION     Dr. Loney Loh  . THUMB FUSION  08/2007   with bone graft- post trauma  . toatal knee replacement Right 2001  . TOTAL KNEE ARTHROPLASTY Left 08/02/2015   Procedure: LEFT TOTAL KNEE ARTHROPLASTY;  Surgeon: Gaynelle Arabian, MD;  Location: [facility];  Service: Orthopedics;  Laterality: Left;  . TUBAL LIGATION  1994    Family History  Problem Relation Age of Onset  . Coronary artery disease Father   . Heart disease Father   . Heart attack Father   .  Coronary artery disease Brother   . Hypertension Brother   . Cancer Mother   . Leukemia Mother 37  . Osteoporosis Mother   . Hypertension Sister   . Osteoporosis Sister   . Hypertension Brother   . Osteoporosis Maternal Aunt     Allergies  Allergen Reactions  . Ciprofloxacin Dermatitis, Other (See Comments) and Rash    Severe burning, blisters on toes  . Tetanus-Diphtheria Toxoids Td Swelling    SWELLING REACTION UNSPECIFIED  [LOCAL ? Vs SYSTEMIC ?]  . Codeine Nausea And Vomiting    No current outpatient medications on file prior to visit.   No current facility-administered medications on file prior to visit.     BP (!) 158/80 (BP Location: Right Arm, Patient Position: Sitting, Cuff Size: Normal)   Pulse 78   Temp 97.7 F (36.5 C) (Oral)   Ht 5' 3.75" (1.619 m)   Wt 127 lb (57.6 kg)   LMP 11/10/2014  (Exact Date)   SpO2 97%   BMI 21.97 kg/m   Initial welcome to Medicare visit  1. Risk factors, based on past  M,S,F history.  Patient has a history of essential hypertension presently observed off medications.  She has mild dyslipidemia and has been on Zetia  2.  Physical activities: Presently limited by orthopedic issues but generally walks her dog 3 miles per day  3.  Depression/mood: History of mild insomnia and anxiety disorder but no major depression   4.  Hearing: No deficits  5.  ADL's: Independent  6.  Fall risk: Low patient does have 2 falls recently associated with tripping over her dog  7.  Home safety: No problems identified  8.  Height weight, and visual acuity; height and weight stable no change in visual acuity has done well following cataract extraction surgery  9.  Counseling: Continue active lifestyle and heart healthy diet  10. Lab orders based on risk factors: We will check laboratory update including lipid profile and calcium  11. Referral : Follow-up OB/GYN  12. Care plan: Continue efforts at aggressive risk factor modification  13. Cognitive assessment: Alert and oriented with normal affect no cognitive dysfunction   14. Screening: Patient provided with a written and personalized 5-10 year screening schedule in the AVS.    15. Provider List Update: Primary care orthopedics and gynecology  16.  Advance directives.  Patient does have a living will and healthcare power of attorney in place    Review of Systems  Constitutional: Negative.   HENT: Negative for congestion, dental problem, hearing loss, rhinorrhea, sinus pressure, sore throat and tinnitus.   Eyes: Negative for pain, discharge and visual disturbance.  Respiratory: Negative for cough and shortness of breath.   Cardiovascular: Negative for chest pain, palpitations and leg swelling.  Gastrointestinal: Negative for abdominal distention, abdominal pain, blood in stool, constipation, diarrhea,  nausea and vomiting.  Genitourinary: Negative for difficulty urinating, dysuria, flank pain, frequency, hematuria, pelvic pain, urgency, vaginal bleeding, vaginal discharge and vaginal pain.  Musculoskeletal: Negative for arthralgias, gait problem and joint swelling.       Pain and swelling left knee following recent trauma  Skin: Negative for rash.  Neurological: Negative for dizziness, syncope, speech difficulty, weakness, numbness and headaches.  Hematological: Negative for adenopathy.  Psychiatric/Behavioral: Negative for agitation, behavioral problems and dysphoric mood. The patient is not nervous/anxious.        Objective:   Physical Exam  Constitutional: She is oriented to person, place, and time. She appears well-developed and  well-nourished.  Repeat blood pressure 126/80  HENT:  Head: Normocephalic.  Right Ear: External ear normal.  Left Ear: External ear normal.  Mouth/Throat: Oropharynx is clear and moist.  Eyes: Conjunctivae and EOM are normal. Pupils are equal, round, and reactive to light.  Neck: Normal range of motion. Neck supple. No thyromegaly present.  Cardiovascular: Normal rate, regular rhythm, normal heart sounds and intact distal pulses.  Pulmonary/Chest: Effort normal and breath sounds normal.  Abdominal: Soft. Bowel sounds are normal. She exhibits no mass. There is no tenderness.  Musculoskeletal: Normal range of motion.  Status post left total knee replacement surgery with soft tissue swelling related to recent trauma Surgical scar left elbow area  Lymphadenopathy:    She has no cervical adenopathy.  Neurological: She is alert and oriented to person, place, and time.  Skin: Skin is warm and dry. No rash noted.  Psychiatric: She has a normal mood and affect. Her behavior is normal.          Assessment & Plan:   Initial welcome to Medicare visit.  Patient given Prevnar 13 Dyslipidemia we will check lipid profile Status post recent left olecranon  fracture.  Follow-up orthopedics  Gynecology follow-up as scheduled  Return here in 1 year or as needed.  Will give Pneumovax at the time of her next visit in 1 year  Nyoka Cowden

## 2017-05-14 NOTE — Patient Instructions (Signed)
Return in 1 year for follow-up  Gynecologic follow-up as scheduled

## 2017-05-15 LAB — HEPATITIS C ANTIBODY
Hepatitis C Ab: NONREACTIVE
SIGNAL TO CUT-OFF: 0.01 (ref <1.00)

## 2017-05-17 ENCOUNTER — Other Ambulatory Visit (INDEPENDENT_AMBULATORY_CARE_PROVIDER_SITE_OTHER): Payer: PPO

## 2017-05-17 DIAGNOSIS — E039 Hypothyroidism, unspecified: Secondary | ICD-10-CM | POA: Diagnosis not present

## 2017-05-17 DIAGNOSIS — I519 Heart disease, unspecified: Secondary | ICD-10-CM

## 2017-05-17 LAB — T4, FREE: FREE T4: 1.39 ng/dL (ref 0.60–1.60)

## 2017-05-22 NOTE — Progress Notes (Signed)
Yes it has. T4 added!

## 2017-05-30 DIAGNOSIS — M25522 Pain in left elbow: Secondary | ICD-10-CM | POA: Diagnosis not present

## 2017-05-30 DIAGNOSIS — S52032D Displaced fracture of olecranon process with intraarticular extension of left ulna, subsequent encounter for closed fracture with routine healing: Secondary | ICD-10-CM | POA: Diagnosis not present

## 2017-05-30 NOTE — Progress Notes (Signed)
Patient notified of results. Appointment scheduled. No further action needed.

## 2017-05-31 ENCOUNTER — Encounter: Payer: Self-pay | Admitting: Internal Medicine

## 2017-05-31 DIAGNOSIS — S83412D Sprain of medial collateral ligament of left knee, subsequent encounter: Secondary | ICD-10-CM | POA: Diagnosis not present

## 2017-05-31 DIAGNOSIS — M1712 Unilateral primary osteoarthritis, left knee: Secondary | ICD-10-CM | POA: Diagnosis not present

## 2017-06-04 ENCOUNTER — Other Ambulatory Visit: Payer: PPO

## 2017-06-05 ENCOUNTER — Other Ambulatory Visit: Payer: PPO

## 2017-06-06 DIAGNOSIS — M25562 Pain in left knee: Secondary | ICD-10-CM | POA: Diagnosis not present

## 2017-06-08 DIAGNOSIS — M25562 Pain in left knee: Secondary | ICD-10-CM | POA: Diagnosis not present

## 2017-06-11 DIAGNOSIS — M25562 Pain in left knee: Secondary | ICD-10-CM | POA: Diagnosis not present

## 2017-06-13 DIAGNOSIS — M25562 Pain in left knee: Secondary | ICD-10-CM | POA: Diagnosis not present

## 2017-06-18 DIAGNOSIS — M25562 Pain in left knee: Secondary | ICD-10-CM | POA: Diagnosis not present

## 2017-06-20 DIAGNOSIS — M25562 Pain in left knee: Secondary | ICD-10-CM | POA: Diagnosis not present

## 2017-06-26 DIAGNOSIS — M25562 Pain in left knee: Secondary | ICD-10-CM | POA: Diagnosis not present

## 2017-07-02 DIAGNOSIS — Z961 Presence of intraocular lens: Secondary | ICD-10-CM | POA: Diagnosis not present

## 2017-07-20 ENCOUNTER — Other Ambulatory Visit (HOSPITAL_COMMUNITY): Payer: Self-pay | Admitting: Orthopedic Surgery

## 2017-07-20 DIAGNOSIS — M25562 Pain in left knee: Secondary | ICD-10-CM

## 2017-07-20 DIAGNOSIS — S83412D Sprain of medial collateral ligament of left knee, subsequent encounter: Secondary | ICD-10-CM | POA: Diagnosis not present

## 2017-07-25 ENCOUNTER — Encounter: Payer: Self-pay | Admitting: Internal Medicine

## 2017-07-25 ENCOUNTER — Ambulatory Visit (INDEPENDENT_AMBULATORY_CARE_PROVIDER_SITE_OTHER): Payer: PPO | Admitting: Internal Medicine

## 2017-07-25 VITALS — BP 150/86 | HR 81 | Temp 98.1°F | Wt 127.0 lb

## 2017-07-25 DIAGNOSIS — E785 Hyperlipidemia, unspecified: Secondary | ICD-10-CM | POA: Diagnosis not present

## 2017-07-25 DIAGNOSIS — E0590 Thyrotoxicosis, unspecified without thyrotoxic crisis or storm: Secondary | ICD-10-CM | POA: Diagnosis not present

## 2017-07-25 DIAGNOSIS — R7989 Other specified abnormal findings of blood chemistry: Secondary | ICD-10-CM

## 2017-07-25 DIAGNOSIS — I10 Essential (primary) hypertension: Secondary | ICD-10-CM

## 2017-07-25 LAB — TSH

## 2017-07-25 LAB — T4, FREE: FREE T4: 1.07 ng/dL (ref 0.60–1.60)

## 2017-07-25 LAB — T3, FREE: T3 FREE: 4.2 pg/mL (ref 2.3–4.2)

## 2017-07-25 NOTE — Patient Instructions (Addendum)
Please follow-up with your endocrinologist   Hyperthyroidism Hyperthyroidism is when the thyroid is too active (overactive). Your thyroid is a large gland that is located in your neck. The thyroid helps to control how your body uses food (metabolism). When your thyroid is overactive, it produces too much of a hormone called thyroxine. What are the causes? Causes of hyperthyroidism may include:  Graves disease. This is when your immune system attacks the thyroid gland. This is the most common cause.  Inflammation of the thyroid gland.  Tumor in the thyroid gland or somewhere else.  Excessive use of thyroid medicines, including: ? Prescription thyroid supplement. ? Herbal supplements that mimic thyroid hormones.  Solid or fluid-filled lumps within your thyroid gland (thyroid nodules).  Excessive ingestion of iodine.  What increases the risk?  Being female.  Having a family history of thyroid conditions. What are the signs or symptoms? Signs and symptoms of hyperthyroidism may include:  Nervousness.  Inability to tolerate heat.  Unexplained weight loss.  Diarrhea.  Change in the texture of hair or skin.  Heart skipping beats or making extra beats.  Rapid heart rate.  Loss of menstruation.  Shaky hands.  Fatigue.  Restlessness.  Increased appetite.  Sleep problems.  Enlarged thyroid gland or nodules.  How is this diagnosed? Diagnosis of hyperthyroidism may include:  Medical history and physical exam.  Blood tests.  Ultrasound tests.  How is this treated? Treatment may include:  Medicines to control your thyroid.  Surgery to remove your thyroid.  Radiation therapy.  Follow these instructions at home:  Take medicines only as directed by your health care provider.  Do not use any tobacco products, including cigarettes, chewing tobacco, or electronic cigarettes. If you need help quitting, ask your health care provider.  Do not exercise or do  physical activity until your health care provider approves.  Keep all follow-up appointments as directed by your health care provider. This is important. Contact a health care provider if:  Your symptoms do not get better with treatment.  You have fever.  You are taking thyroid replacement medicine and you: ? Have depression. ? Feel mentally and physically slow. ? Have weight gain. Get help right away if:  You have decreased alertness or a change in your awareness.  You have abdominal pain.  You feel dizzy.  You have a rapid heartbeat.  You have an irregular heartbeat. This information is not intended to replace advice given to you by your health care provider. Make sure you discuss any questions you have with your health care provider. Document Released: 02/13/2005 Document Revised: 07/15/2015 Document Reviewed: 07/01/2013 Elsevier Interactive Patient Education  2018 [employer].

## 2017-07-25 NOTE — Progress Notes (Signed)
Subjective:    Patient ID: Cassandra Mcfarland, female    DOB: [birth year], 67 y.o.   MRN: [id number]  HPI  67 year old patient who is seen for an annual visit 2 months ago.  Laboratory studies revealed a suppressed TSH but a free T4 was normal. 2 months ago she initially began having pain involving the tongue as well as the anterior hard palate.  This has intensified and is described as a burning quality discomfort.  Pain is usually very mild in the morning but intensifies throughout the day the severe burning seems aggravated by ingestion of sugar.  Denies any color change.  She does describe her hard palate is being slightly rough.  She states that through the day she uses ice water and ice to help alleviate symptoms.  She made a phone contact with the office and multivitamins were recommended.  Over the past month she has noted a change in the quality of her nails which have become softer and more brittle.  She denies any hair or skin changes. Her weight over the past 2 months has been unchanged.  She does have a history of  Hyperparathyroidism and has seen endocrinology in the past  Past Medical History:  Diagnosis Date  . Anemia    after knee surgery  . Anxiety   . Complication of anesthesia   . Depression   . History of urinary tract infection   . Hypertension    no longer on medications  . Osteoarthritis   . PONV (postoperative nausea and vomiting)   . Situational anxiety 09/2003  . Situational depression   . Thyroid disease    hyper parathyroidism     Social History   Socioeconomic History  . Marital status: Married    Spouse name: Not on file  . Number of children: Not on file  . Years of education: Not on file  . Highest education level: Not on file  Occupational History  . Not on file  Social Needs  . Financial resource strain: Not on file  . Food insecurity:    Worry: Not on file    Inability: Not on file  . Transportation needs:    Medical: Not on file   Non-medical: Not on file  Tobacco Use  . Smoking status: Never Smoker  . Smokeless tobacco: Never Used  Substance and Sexual Activity  . Alcohol use: Yes    Comment: occas glass of wine   . Drug use: No  . Sexual activity: Yes    Partners: Male    Birth control/protection: Post-menopausal  Lifestyle  . Physical activity:    Days per week: Not on file    Minutes per session: Not on file  . Stress: Not on file  Relationships  . Social connections:    Talks on phone: Not on file    Gets together: Not on file    Attends religious service: Not on file    Active member of club or organization: Not on file    Attends meetings of clubs or organizations: Not on file    Relationship status: Not on file  . Intimate partner violence:    Fear of current or ex partner: Not on file    Emotionally abused: Not on file    Physically abused: Not on file    Forced sexual activity: Not on file  Other Topics Concern  . Not on file  Social History Narrative  . Not on file    Past Surgical  History:  Procedure Laterality Date  . abddominoplasty  1995  . APPENDECTOMY  Age 45  . BREAST REDUCTION SURGERY  09/2001   & Lift  . COLONOSCOPY    . ORIF ELBOW FRACTURE Left 03/15/2017   Procedure: OPEN REDUCTION INTERNAL FIXATION (ORIF) ELBOW/OLECRANON FRACTURE;  Surgeon: Altamese , MD;  Location: [facility];  Service: Orthopedics;  Laterality: Left;  . PARATHYROIDECTOMY  5/1/112   due to hypercalcemia  . SHOULDER SURGERY     left rotator cuff; bicep tendon repair on left   . THUMB FUSION     Dr. Loney Loh  . THUMB FUSION  08/2007   with bone graft- post trauma  . toatal knee replacement Right 2001  . TOTAL KNEE ARTHROPLASTY Left 08/02/2015   Procedure: LEFT TOTAL KNEE ARTHROPLASTY;  Surgeon: Gaynelle Arabian, MD;  Location: [facility];  Service: Orthopedics;  Laterality: Left;  . TUBAL LIGATION  1994    Family History  Problem Relation Age of Onset  . Coronary artery disease Father   . Heart disease  Father   . Heart attack Father   . Coronary artery disease Brother   . Hypertension Brother   . Cancer Mother   . Leukemia Mother 39  . Osteoporosis Mother   . Hypertension Sister   . Osteoporosis Sister   . Hypertension Brother   . Osteoporosis Maternal Aunt     Allergies  Allergen Reactions  . Ciprofloxacin Dermatitis, Other (See Comments) and Rash    Severe burning, blisters on toes  . Tetanus-Diphtheria Toxoids Td Swelling    SWELLING REACTION UNSPECIFIED  [LOCAL ? Vs SYSTEMIC ?]  . Codeine Nausea And Vomiting    Current Outpatient Medications on File Prior to Visit  Medication Sig Dispense Refill  . ALPRAZolam (XANAX) 0.5 MG tablet Take 1 tablet (0.5 mg total) by mouth at bedtime as needed. 90 tablet 0  . diazepam (VALIUM) 5 MG tablet Take 1 tablet (5 mg total) by mouth every 12 (twelve) hours as needed for anxiety. 60 tablet 0  . ezetimibe (ZETIA) 10 MG tablet TAKE 1 TABLET(10 MG) BY MOUTH DAILY 90 tablet 4   No current facility-administered medications on file prior to visit.     BP (!) 150/86 (BP Location: Right Arm, Patient Position: Sitting, Cuff Size: Normal)   Pulse 81   Temp 98.1 F (36.7 C) (Oral)   Wt 127 lb (57.6 kg)   LMP 11/10/2014 (Exact Date)   SpO2 100%   BMI 21.97 kg/m      Review of Systems  Constitutional: Negative.   HENT: Negative for congestion, dental problem, hearing loss, rhinorrhea, sinus pressure, sore throat and tinnitus.        Burning discomfort of the tongue and hard palate of 2 months duration  Eyes: Negative for pain, discharge and visual disturbance.  Respiratory: Negative for cough and shortness of breath.   Cardiovascular: Negative for chest pain, palpitations and leg swelling.  Gastrointestinal: Negative for abdominal distention, abdominal pain, blood in stool, constipation, diarrhea, nausea and vomiting.  Genitourinary: Negative for difficulty urinating, dysuria, flank pain, frequency, hematuria, pelvic pain, urgency,  vaginal bleeding, vaginal discharge and vaginal pain.  Musculoskeletal: Negative for arthralgias, gait problem and joint swelling.  Skin: Negative for rash.       1 month history of nail changes which have become softer and more brittle  Neurological: Negative for dizziness, syncope, speech difficulty, weakness, numbness and headaches.  Hematological: Negative for adenopathy.  Psychiatric/Behavioral: Negative for agitation, behavioral problems and dysphoric  mood. The patient is not nervous/anxious.        Objective:   Physical Exam  Constitutional: She is oriented to person, place, and time. She appears well-developed and well-nourished.  HENT:  Head: Normocephalic.  Right Ear: External ear normal.  Left Ear: External ear normal.  Mouth/Throat: Oropharynx is clear and moist.  Tongue and hard palate appeared normal  Eyes: Pupils are equal, round, and reactive to light. Conjunctivae and EOM are normal.  Neck: Normal range of motion. Neck supple. No thyromegaly present.  No thyromegaly  Cardiovascular: Normal rate, regular rhythm, normal heart sounds and intact distal pulses.  No tachycardia  Pulmonary/Chest: Effort normal and breath sounds normal.  Abdominal: Soft. Bowel sounds are normal. She exhibits no mass. There is no tenderness.  Musculoskeletal: Normal range of motion.  Lymphadenopathy:    She has no cervical adenopathy.  Neurological: She is alert and oriented to person, place, and time.  Tremor the outstretched hands Hyperreflexia  Skin: Skin is warm and dry. No rash noted.  Thinning of the nails  Psychiatric: She has a normal mood and affect. Her behavior is normal.          Assessment & Plan:   Suppressed TSH.  We will repeat with free T3 and free T4.  Patient now has developed nail changes slight tremor and hyperreflexia.  Suspect clinical hyperthyroidism.  Follow-up endocrinology Hopefully discomfort of tongue and hard palate will improve with treatment of  hyperthyroidism.  Patient did give herself a trial off Zetia without benefit  history of hyperparathyroidism-status post parathyroidectomy     Nyoka Cowden

## 2017-07-26 ENCOUNTER — Telehealth: Payer: Self-pay | Admitting: Internal Medicine

## 2017-07-26 ENCOUNTER — Other Ambulatory Visit: Payer: Self-pay

## 2017-07-26 DIAGNOSIS — E0590 Thyrotoxicosis, unspecified without thyrotoxic crisis or storm: Secondary | ICD-10-CM

## 2017-07-26 DIAGNOSIS — E210 Primary hyperparathyroidism: Secondary | ICD-10-CM

## 2017-07-26 NOTE — Telephone Encounter (Signed)
Spoke to patient informing her that referral has been placed. Pt verbalized understanding.

## 2017-07-26 NOTE — Telephone Encounter (Signed)
Copied from [place] [id number]. Topic: Referral - Request >> Jul 26, 2017  1:13 PM Boyd Kerbs wrote: Reason for CRM:   pt called Endo Dr. Chalmers Cater and was told she has to have a referral .   Please let pt. Know when put in

## 2017-08-01 ENCOUNTER — Encounter (HOSPITAL_COMMUNITY)
Admission: RE | Admit: 2017-08-01 | Discharge: 2017-08-01 | Disposition: A | Discharge status: Home / Self Care | Payer: PPO | Source: Ambulatory Visit | Attending: Orthopedic Surgery | Admitting: Orthopedic Surgery

## 2017-08-01 DIAGNOSIS — Z96653 Presence of artificial knee joint, bilateral: Secondary | ICD-10-CM | POA: Diagnosis not present

## 2017-08-01 DIAGNOSIS — M25562 Pain in left knee: Secondary | ICD-10-CM | POA: Insufficient documentation

## 2017-08-01 DIAGNOSIS — R9389 Abnormal findings on diagnostic imaging of other specified body structures: Secondary | ICD-10-CM | POA: Insufficient documentation

## 2017-08-01 DIAGNOSIS — R948 Abnormal results of function studies of other organs and systems: Secondary | ICD-10-CM | POA: Diagnosis not present

## 2017-08-01 MED ORDER — TECHNETIUM TC 99M MEDRONATE IV KIT
20.0000 | PACK | Freq: Once | INTRAVENOUS | Status: AC | PRN
  Administered 2017-08-01: 20 via INTRAVENOUS

## 2017-08-02 ENCOUNTER — Encounter: Payer: Self-pay | Admitting: Internal Medicine

## 2017-08-28 DIAGNOSIS — E0590 Thyrotoxicosis, unspecified without thyrotoxic crisis or storm: Secondary | ICD-10-CM | POA: Diagnosis not present

## 2017-08-29 ENCOUNTER — Other Ambulatory Visit (HOSPITAL_COMMUNITY): Payer: Self-pay | Admitting: Endocrinology

## 2017-08-29 DIAGNOSIS — E0590 Thyrotoxicosis, unspecified without thyrotoxic crisis or storm: Secondary | ICD-10-CM

## 2017-09-10 ENCOUNTER — Encounter (HOSPITAL_COMMUNITY)
Admission: RE | Admit: 2017-09-10 | Discharge: 2017-09-10 | Disposition: A | Discharge status: Home / Self Care | Payer: PPO | Source: Ambulatory Visit | Attending: Endocrinology | Admitting: Endocrinology

## 2017-09-10 DIAGNOSIS — E0520 Thyrotoxicosis with toxic multinodular goiter without thyrotoxic crisis or storm: Secondary | ICD-10-CM | POA: Diagnosis not present

## 2017-09-10 DIAGNOSIS — E0590 Thyrotoxicosis, unspecified without thyrotoxic crisis or storm: Secondary | ICD-10-CM | POA: Diagnosis not present

## 2017-09-10 MED ORDER — SODIUM IODIDE I-123 7.4 MBQ CAPS
298.0000 | ORAL_CAPSULE | Freq: Once | ORAL | Status: AC
  Administered 2017-09-10: 298 via ORAL

## 2017-09-11 ENCOUNTER — Encounter (HOSPITAL_COMMUNITY)
Admission: RE | Admit: 2017-09-11 | Discharge: 2017-09-11 | Disposition: A | Discharge status: Home / Self Care | Payer: PPO | Source: Ambulatory Visit | Attending: Endocrinology | Admitting: Endocrinology

## 2017-09-11 DIAGNOSIS — E042 Nontoxic multinodular goiter: Secondary | ICD-10-CM | POA: Diagnosis not present

## 2017-09-14 ENCOUNTER — Other Ambulatory Visit: Payer: Self-pay | Admitting: Endocrinology

## 2017-09-14 DIAGNOSIS — E0590 Thyrotoxicosis, unspecified without thyrotoxic crisis or storm: Secondary | ICD-10-CM

## 2017-09-19 DIAGNOSIS — H35371 Puckering of macula, right eye: Secondary | ICD-10-CM | POA: Diagnosis not present

## 2017-09-19 DIAGNOSIS — Z961 Presence of intraocular lens: Secondary | ICD-10-CM | POA: Diagnosis not present

## 2017-09-24 ENCOUNTER — Ambulatory Visit
Admission: RE | Admit: 2017-09-24 | Discharge: 2017-09-24 | Disposition: A | Discharge status: Home / Self Care | Payer: PPO | Source: Ambulatory Visit | Attending: Endocrinology | Admitting: Endocrinology

## 2017-09-24 DIAGNOSIS — E0590 Thyrotoxicosis, unspecified without thyrotoxic crisis or storm: Secondary | ICD-10-CM

## 2017-09-24 DIAGNOSIS — E041 Nontoxic single thyroid nodule: Secondary | ICD-10-CM | POA: Diagnosis not present

## 2017-09-25 DIAGNOSIS — E0590 Thyrotoxicosis, unspecified without thyrotoxic crisis or storm: Secondary | ICD-10-CM | POA: Diagnosis not present

## 2017-09-26 ENCOUNTER — Other Ambulatory Visit: Payer: Self-pay

## 2017-09-26 ENCOUNTER — Telehealth: Payer: Self-pay | Admitting: Internal Medicine

## 2017-09-26 MED ORDER — ESCITALOPRAM OXALATE 5 MG PO TABS: 5.0000 mg | ORAL_TABLET | Freq: Every day | ORAL | 0 refills | Status: DC

## 2017-09-26 NOTE — Telephone Encounter (Signed)
Copied from [place] [id number]. Topic: General - Other >> Sep 26, 2017 11:16 AM Cecelia Byars, NT wrote: Reason for CRM: Patient called and would like to begin taking escitalopram 5 mg again and would like this sent to [facility], [place] - [address] OF [address] [phone] (Phone) [phone] (Fax

## 2017-09-26 NOTE — Telephone Encounter (Signed)
Okay #90.  Suggest follow-up in 6 to 8 weeks

## 2017-09-26 NOTE — Telephone Encounter (Signed)
Left detailed message informing pt of update. 

## 2017-09-26 NOTE — Telephone Encounter (Signed)
Okay for refill? Please advise 

## 2017-10-05 ENCOUNTER — Other Ambulatory Visit: Payer: Self-pay | Admitting: Internal Medicine

## 2017-10-17 ENCOUNTER — Ambulatory Visit: Payer: Self-pay | Admitting: Surgery

## 2017-10-17 ENCOUNTER — Other Ambulatory Visit: Payer: Self-pay

## 2017-10-17 ENCOUNTER — Encounter (HOSPITAL_COMMUNITY): Payer: Self-pay

## 2017-10-17 ENCOUNTER — Ambulatory Visit (HOSPITAL_COMMUNITY)
Admission: RE | Admit: 2017-10-17 | Discharge: 2017-10-17 | Disposition: A | Discharge status: Home / Self Care | Payer: PPO | Source: Ambulatory Visit | Attending: Anesthesiology | Admitting: Anesthesiology

## 2017-10-17 ENCOUNTER — Encounter (HOSPITAL_COMMUNITY)
Admission: RE | Admit: 2017-10-17 | Discharge: 2017-10-17 | Disposition: A | Discharge status: Home / Self Care | Payer: PPO | Source: Ambulatory Visit | Attending: Surgery | Admitting: Surgery

## 2017-10-17 DIAGNOSIS — Z01812 Encounter for preprocedural laboratory examination: Secondary | ICD-10-CM | POA: Diagnosis not present

## 2017-10-17 DIAGNOSIS — Z01818 Encounter for other preprocedural examination: Secondary | ICD-10-CM

## 2017-10-17 DIAGNOSIS — E042 Nontoxic multinodular goiter: Secondary | ICD-10-CM | POA: Diagnosis not present

## 2017-10-17 DIAGNOSIS — E0590 Thyrotoxicosis, unspecified without thyrotoxic crisis or storm: Secondary | ICD-10-CM | POA: Diagnosis not present

## 2017-10-17 LAB — CBC
HEMATOCRIT: 38.6 % (ref 36.0–46.0)
HEMOGLOBIN: 12.7 g/dL (ref 12.0–15.0)
MCH: 29.3 pg (ref 26.0–34.0)
MCHC: 32.9 g/dL (ref 30.0–36.0)
MCV: 88.9 fL (ref 78.0–100.0)
Platelets: 181 10*3/uL (ref 150–400)
RBC: 4.34 MIL/uL (ref 3.87–5.11)
RDW: 14.3 % (ref 11.5–15.5)
WBC: 4 10*3/uL (ref 4.0–10.5)

## 2017-10-17 NOTE — Patient Instructions (Signed)
Cassandra Mcfarland  10/17/2017   Your procedure is scheduled on: 10/23/2017   Report to [facility] Main  Entrance  Report to admitting at   [place] AM    Call this number if you have problems the morning of surgery [phone]   Remember: Do not eat food or drink liquids :After Midnight.     Take these medicines the morning of surgery with A SIP OF WATER: Valium if needed, Lexapro                                 You may not have any metal on your body including hair pins and              piercings  Do not wear jewelry, make-up, lotions, powders or perfumes, deodorant             Do not wear nail polish.  Do not shave  48 hours prior to surgery.                Do not bring valuables to the hospital. [facility].  Contacts, dentures or bridgework may not be worn into surgery.  Leave suitcase in the car. After surgery it may be brought to your room.                     Please read over the following fact sheets you were given: _____________________________________________________________________             [facility] - Preparing for Surgery Before surgery, you can play an important role.  Because skin is not sterile, your skin needs to be as free of germs as possible.  You can reduce the number of germs on your skin by washing with CHG (chlorahexidine gluconate) soap before surgery.  CHG is an antiseptic cleaner which kills germs and bonds with the skin to continue killing germs even after washing. Please DO NOT use if you have an allergy to CHG or antibacterial soaps.  If your skin becomes reddened/irritated stop using the CHG and inform your nurse when you arrive at Short Stay. Do not shave (including legs and underarms) for at least 48 hours prior to the first CHG shower.  You may shave your face/neck. Please follow these instructions carefully:  1.  Shower with CHG Soap the night before surgery and the   morning of Surgery.  2.  If you choose to wash your hair, wash your hair first as usual with your  normal  shampoo.  3.  After you shampoo, rinse your hair and body thoroughly to remove the  shampoo.                           4.  Use CHG as you would any other liquid soap.  You can apply chg directly  to the skin and wash                       Gently with a scrungie or clean washcloth.  5.  Apply the CHG Soap to your body ONLY FROM THE NECK DOWN.   Do not use on face/ open  Wound or open sores. Avoid contact with eyes, ears mouth and genitals (private parts).                       Wash face,  Genitals (private parts) with your normal soap.             6.  Wash thoroughly, paying special attention to the area where your surgery  will be performed.  7.  Thoroughly rinse your body with warm water from the neck down.  8.  DO NOT shower/wash with your normal soap after using and rinsing off  the CHG Soap.                9.  Pat yourself dry with a clean towel.            10.  Wear clean pajamas.            11.  Place clean sheets on your bed the night of your first shower and do not  sleep with pets. Day of Surgery : Do not apply any lotions/deodorants the morning of surgery.  Please wear clean clothes to the hospital/surgery center.  FAILURE TO FOLLOW THESE INSTRUCTIONS MAY RESULT IN THE CANCELLATION OF YOUR SURGERY PATIENT SIGNATURE_________________________________  NURSE SIGNATURE__________________________________  ________________________________________________________________________

## 2017-10-17 NOTE — Progress Notes (Signed)
ekg 03-15-17 epic

## 2017-10-17 NOTE — Progress Notes (Signed)
Chart left w/ Rise Paganini for follow up

## 2017-10-21 ENCOUNTER — Encounter (HOSPITAL_COMMUNITY): Payer: Self-pay | Admitting: Surgery

## 2017-10-21 DIAGNOSIS — E042 Nontoxic multinodular goiter: Secondary | ICD-10-CM | POA: Diagnosis present

## 2017-10-21 DIAGNOSIS — E0590 Thyrotoxicosis, unspecified without thyrotoxic crisis or storm: Secondary | ICD-10-CM | POA: Diagnosis present

## 2017-10-21 NOTE — H&P (Signed)
General Surgery [facility]  Shari Heritage DOB: Oct 11, 1950 Married / Language: Cassandra Mcfarland / Race: White Female   History of Present Illness  The patient is a 67 year old female who presents with hyperthyroidism.  CHIEF COMPLAINT: hyperthyroidism, bilateral thyroid nodules  Patient is referred by Dr. Jacelyn Pi for surgical evaluation and management of hyperthyroidism with bilateral thyroid nodules. Patient is known to my practice from parathyroidectomy performed approximately 8-10 years ago. Patient has done well from that procedure. A few months ago the patient developed oral discomfort. She was seen and evaluated by her primary care physician. She was noted to have a tremor. Laboratory studies showed a suppressed TSH level of 0.01. Patient was referred to endocrinology. She was treated initially with methimazole but unfortunately had a reaction to the medication. After discussion. Patient has opted to pursue surgical treatment. Ultrasound of the thyroid was performed on September 24, 2017. This showed a heterogeneous gland with the right lobe measuring 4.9 cm and left lobe measuring 4.0 cm. There were small bilateral thyroid nodules. Patient is not on beta blockade. Previous neck surgery for left inferior parathyroidectomy. No family history of endocrine neoplasm or malignancy. Patient denies any dysphagia. She does have occasional mild hoarseness which is transient. She is accompanied today by her husband.   Past Surgical History  Appendectomy  Cataract Surgery  Bilateral. Knee Surgery  Bilateral. Mammoplasty; Reduction  Bilateral. Shoulder Surgery  Left. Thyroid Surgery   Diagnostic Studies History Colonoscopy  1-5 years ago Mammogram  1-3 years ago Pap Smear  1-5 years ago  Allergies Codeine/Codeine Derivatives  Cipro *FLUOROQUINOLONES*  MethIMAzole *THYROID AGENTS*   Medication History ALPRAZolam (0.5MG  Tablet, Oral)  Active. DiazePAM (5MG  Tablet, Oral) Active. Escitalopram Oxalate (5MG  Tablet, Oral) Active. Estradiol (10MCG Tablet, Vaginal) Active. Ezetimibe (10MG  Tablet, Oral) Active. Medications Reconciled  Social History Alcohol use  Occasional alcohol use. Caffeine use  Carbonated beverages, Coffee. No drug use  Tobacco use  Never smoker.  Family History Alcohol Abuse  Brother, Son. Cancer  Mother. Depression  Sister. Heart Disease  Brother, Father. Melanoma  Mother. Migraine Headache  Mother. Respiratory Condition  Sister. Thyroid problems  Daughter, Sister.  Pregnancy / Birth History  Age at menarche  89 years. Age of menopause  55-55 Gravida  2 Length (months) of breastfeeding  3-6 Maternal age  4-25 Para  2  Other Problems Anxiety Disorder  Arthritis  Depression  Diverticulosis  High blood pressure  Hypercholesterolemia  Thyroid Disease     Review of Systems General Present- Appetite Loss and Fatigue. Not Present- Chills, Fever, Night Sweats, Weight Gain and Weight Loss. Skin Not Present- Change in Wart/Mole, Dryness, Hives, Jaundice, New Lesions, Non-Healing Wounds, Rash and Ulcer. HEENT Present- Sore Throat. Not Present- Earache, Hearing Loss, Hoarseness, Nose Bleed, Oral Ulcers, Ringing in the Ears, Seasonal Allergies, Sinus Pain, Visual Disturbances, Wears glasses/contact lenses and Yellow Eyes. Respiratory Not Present- Bloody sputum, Chronic Cough, Difficulty Breathing, Snoring and Wheezing. Breast Not Present- Breast Mass, Breast Pain, Nipple Discharge and Skin Changes. Cardiovascular Present- Leg Cramps and Rapid Heart Rate. Not Present- Chest Pain, Difficulty Breathing Lying Down, Palpitations, Shortness of Breath and Swelling of Extremities. Gastrointestinal Present- Gets full quickly at meals and Nausea. Not Present- Abdominal Pain, Bloating, Bloody Stool, Change in Bowel Habits, Chronic diarrhea, Constipation, Difficulty  Swallowing, Excessive gas, Hemorrhoids, Indigestion, Rectal Pain and Vomiting. Female Genitourinary Not Present- Frequency, Nocturia, Painful Urination, Pelvic Pain and Urgency. Musculoskeletal Present- Joint Pain, Joint Stiffness, Muscle Pain and  Muscle Weakness. Not Present- Back Pain and Swelling of Extremities. Neurological Present- Decreased Memory, Headaches, Tremor and Weakness. Not Present- Fainting, Numbness, Seizures, Tingling and Trouble walking. Psychiatric Present- Anxiety, Change in Sleep Pattern and Depression. Not Present- Bipolar, Fearful and Frequent crying. Endocrine Present- Heat Intolerance and Hot flashes. Not Present- Cold Intolerance, Excessive Hunger, Hair Changes and New Diabetes. Hematology Present- Easy Bruising. Not Present- Blood Thinners, Excessive bleeding, Gland problems, HIV and Persistent Infections.  Vitals Weight: 128.8 lb Height: 64.4in Body Surface Area: 1.63 m Body Mass Index: 21.83 kg/m  Temp.: 97.52F(Temporal)  Pulse: 94 (Regular)  BP: 122/78 (Sitting, Left Arm, Standard)   Physical Exam  See vital signs recorded above  GENERAL APPEARANCE Development: normal Nutritional status: normal Gross deformities: none  SKIN Rash, lesions, ulcers: none Induration, erythema: none Nodules: none palpable  EYES Conjunctiva and lids: normal Pupils: equal and reactive Iris: normal bilaterally  EARS, NOSE, MOUTH, THROAT External ears: no lesion or deformity External nose: no lesion or deformity Hearing: grossly normal Lips: no lesion or deformity Dentition: normal for age Oral mucosa: moist  NECK Symmetric: yes Trachea: midline Thyroid: no palpable nodules in the thyroid bed; mild tenderness to palpation Well-healed surgical incision left inferior anterior neck.  CHEST Respiratory effort: normal Retraction or accessory muscle use: no Breath sounds: normal bilaterally Rales, rhonchi, wheeze:  none  CARDIOVASCULAR Auscultation: regular rhythm, normal rate Murmurs: none Pulses: carotid and radial pulse 2+ palpable Lower extremity edema: none Lower extremity varicosities: none  MUSCULOSKELETAL Station and gait: normal Digits and nails: no clubbing or cyanosis Muscle strength: grossly normal all extremities Range of motion: grossly normal all extremities Deformity: none  LYMPHATIC Cervical: none palpable Supraclavicular: none palpable  PSYCHIATRIC Oriented to person, place, and time: yes Mood and affect: normal for situation Judgment and insight: appropriate for situation    Assessment & Plan   HYPERTHYROIDISM (E05.90)  MULTIPLE THYROID NODULES (E04.2)  Patient is referred by her endocrinologist for consideration for total thyroidectomy for management of hyperthyroidism and bilateral thyroid nodules. She is accompanied by her husband. They are provided with written literature on thyroid surgery to review at home.  Patient has bilateral small thyroid nodules and an otherwise normal size thyroid gland. Gland is heterogeneous. Patient has clinically significant hyperthyroidism with a suppressed TSH level of 0.01. After discussion with her endocrinologist on the options for treatment, the patient has elected to proceed with surgical removal of the thyroid gland. I have recommended total thyroidectomy for management. We discussed the risk and benefits of the procedure including the risk of recurrent laryngeal nerve injury and injury to parathyroid glands. We discussed the location of the surgical incision. We discussed the hospital stay and postoperative recovery. Patient and her husband understand and wish to proceed with surgery in the near future.  The risks and benefits of the procedure have been discussed at length with the patient. The patient understands the proposed procedure, potential alternative treatments, and the course of recovery to be expected. All  of the patient's questions have been answered at this time. The patient wishes to proceed with surgery.  Armandina Gemma, [place] [facility] Office: [phone]

## 2017-10-23 ENCOUNTER — Other Ambulatory Visit: Payer: Self-pay

## 2017-10-23 ENCOUNTER — Ambulatory Visit (HOSPITAL_COMMUNITY): Payer: PPO | Admitting: Certified Registered Nurse Anesthetist

## 2017-10-23 ENCOUNTER — Observation Stay (HOSPITAL_COMMUNITY)
Admission: RE | Admit: 2017-10-23 | Discharge: 2017-10-24 | Disposition: A | Discharge status: Home / Self Care | Payer: PPO | Source: Ambulatory Visit | Attending: Surgery | Admitting: Surgery

## 2017-10-23 ENCOUNTER — Encounter (HOSPITAL_COMMUNITY): Payer: Self-pay

## 2017-10-23 ENCOUNTER — Encounter (HOSPITAL_COMMUNITY)
Admission: RE | Disposition: A | Discharge status: Home / Self Care | Payer: Self-pay | Source: Ambulatory Visit | Attending: Surgery

## 2017-10-23 DIAGNOSIS — Z79899 Other long term (current) drug therapy: Secondary | ICD-10-CM | POA: Insufficient documentation

## 2017-10-23 DIAGNOSIS — E7800 Pure hypercholesterolemia, unspecified: Secondary | ICD-10-CM | POA: Insufficient documentation

## 2017-10-23 DIAGNOSIS — F419 Anxiety disorder, unspecified: Secondary | ICD-10-CM | POA: Diagnosis not present

## 2017-10-23 DIAGNOSIS — E0590 Thyrotoxicosis, unspecified without thyrotoxic crisis or storm: Secondary | ICD-10-CM

## 2017-10-23 DIAGNOSIS — Z885 Allergy status to narcotic agent status: Secondary | ICD-10-CM | POA: Diagnosis not present

## 2017-10-23 DIAGNOSIS — Z881 Allergy status to other antibiotic agents status: Secondary | ICD-10-CM | POA: Diagnosis not present

## 2017-10-23 DIAGNOSIS — E0520 Thyrotoxicosis with toxic multinodular goiter without thyrotoxic crisis or storm: Secondary | ICD-10-CM | POA: Insufficient documentation

## 2017-10-23 DIAGNOSIS — F329 Major depressive disorder, single episode, unspecified: Secondary | ICD-10-CM | POA: Diagnosis not present

## 2017-10-23 DIAGNOSIS — E042 Nontoxic multinodular goiter: Secondary | ICD-10-CM | POA: Diagnosis present

## 2017-10-23 DIAGNOSIS — I10 Essential (primary) hypertension: Secondary | ICD-10-CM | POA: Insufficient documentation

## 2017-10-23 DIAGNOSIS — C73 Malignant neoplasm of thyroid gland: Secondary | ICD-10-CM | POA: Diagnosis not present

## 2017-10-23 HISTORY — PX: THYROIDECTOMY: SHX17

## 2017-10-23 SURGERY — THYROIDECTOMY
Anesthesia: General | Site: Neck

## 2017-10-23 MED ORDER — DEXAMETHASONE SODIUM PHOSPHATE 10 MG/ML IJ SOLN
INTRAMUSCULAR | Status: DC | PRN
  Administered 2017-10-23: 10 mg via INTRAVENOUS

## 2017-10-23 MED ORDER — ONDANSETRON 4 MG PO TBDP: 4.0000 mg | ORAL_TABLET | Freq: Four times a day (QID) | ORAL | Status: DC | PRN

## 2017-10-23 MED ORDER — LIDOCAINE 2% (20 MG/ML) 5 ML SYRINGE
INTRAMUSCULAR | Status: DC | PRN
  Administered 2017-10-23: 60 mg via INTRAVENOUS

## 2017-10-23 MED ORDER — SUGAMMADEX SODIUM 200 MG/2ML IV SOLN
INTRAVENOUS | Status: AC
  Filled 2017-10-23: qty 2

## 2017-10-23 MED ORDER — ALPRAZOLAM 0.5 MG PO TABS
0.5000 mg | ORAL_TABLET | Freq: Every evening | ORAL | Status: DC | PRN
  Administered 2017-10-23: 0.5 mg via ORAL
  Filled 2017-10-23: qty 1

## 2017-10-23 MED ORDER — ACETAMINOPHEN 500 MG PO TABS
1000.0000 mg | ORAL_TABLET | Freq: Four times a day (QID) | ORAL | Status: DC
  Administered 2017-10-23 – 2017-10-24 (×4): 1000 mg via ORAL
  Filled 2017-10-23 (×4): qty 2

## 2017-10-23 MED ORDER — EPHEDRINE SULFATE-NACL 50-0.9 MG/10ML-% IV SOSY
PREFILLED_SYRINGE | INTRAVENOUS | Status: DC | PRN
  Administered 2017-10-23 (×2): 10 mg via INTRAVENOUS
  Administered 2017-10-23: 5 mg via INTRAVENOUS

## 2017-10-23 MED ORDER — LIDOCAINE 2% (20 MG/ML) 5 ML SYRINGE
INTRAMUSCULAR | Status: AC
  Filled 2017-10-23: qty 5

## 2017-10-23 MED ORDER — SODIUM CHLORIDE 0.9 % IR SOLN
Status: DC | PRN
  Administered 2017-10-23: 1000 mL

## 2017-10-23 MED ORDER — CEFAZOLIN SODIUM-DEXTROSE 2-4 GM/100ML-% IV SOLN
2.0000 g | INTRAVENOUS | Status: AC
  Administered 2017-10-23: 2 g via INTRAVENOUS

## 2017-10-23 MED ORDER — ONDANSETRON HCL 4 MG/2ML IJ SOLN
INTRAMUSCULAR | Status: AC
  Filled 2017-10-23: qty 2

## 2017-10-23 MED ORDER — PROPOFOL 10 MG/ML IV BOLUS
INTRAVENOUS | Status: AC
  Filled 2017-10-23: qty 20

## 2017-10-23 MED ORDER — ONDANSETRON HCL 4 MG/2ML IJ SOLN
4.0000 mg | Freq: Once | INTRAMUSCULAR | Status: AC | PRN
  Administered 2017-10-23: 4 mg via INTRAVENOUS

## 2017-10-23 MED ORDER — ONDANSETRON HCL 4 MG/2ML IJ SOLN: 4.0000 mg | Freq: Four times a day (QID) | INTRAMUSCULAR | Status: DC | PRN

## 2017-10-23 MED ORDER — ROCURONIUM BROMIDE 50 MG/5ML IV SOSY
PREFILLED_SYRINGE | INTRAVENOUS | Status: DC | PRN
  Administered 2017-10-23: 10 mg via INTRAVENOUS
  Administered 2017-10-23: 40 mg via INTRAVENOUS

## 2017-10-23 MED ORDER — FENTANYL CITRATE (PF) 100 MCG/2ML IJ SOLN: 25.0000 ug | INTRAMUSCULAR | Status: DC | PRN

## 2017-10-23 MED ORDER — DIPHENHYDRAMINE HCL 50 MG/ML IJ SOLN
INTRAMUSCULAR | Status: DC | PRN
  Administered 2017-10-23: 12.5 mg via INTRAVENOUS

## 2017-10-23 MED ORDER — PROPOFOL 500 MG/50ML IV EMUL
INTRAVENOUS | Status: DC | PRN
  Administered 2017-10-23: 25 ug/kg/min via INTRAVENOUS

## 2017-10-23 MED ORDER — LACTATED RINGERS IV SOLN
INTRAVENOUS | Status: DC
  Administered 2017-10-23: 10:00:00 via INTRAVENOUS

## 2017-10-23 MED ORDER — SCOPOLAMINE 1 MG/3DAYS TD PT72
MEDICATED_PATCH | TRANSDERMAL | Status: AC
  Filled 2017-10-23: qty 1

## 2017-10-23 MED ORDER — CALCIUM CARBONATE 1250 (500 CA) MG PO TABS
2.0000 | ORAL_TABLET | Freq: Three times a day (TID) | ORAL | Status: DC
  Administered 2017-10-23 – 2017-10-24 (×3): 1000 mg via ORAL
  Filled 2017-10-23 (×3): qty 1

## 2017-10-23 MED ORDER — FENTANYL CITRATE (PF) 100 MCG/2ML IJ SOLN
INTRAMUSCULAR | Status: AC
  Filled 2017-10-23: qty 2

## 2017-10-23 MED ORDER — DEXAMETHASONE SODIUM PHOSPHATE 10 MG/ML IJ SOLN
INTRAMUSCULAR | Status: AC
  Filled 2017-10-23: qty 1

## 2017-10-23 MED ORDER — DIAZEPAM 5 MG PO TABS: 5.0000 mg | ORAL_TABLET | Freq: Two times a day (BID) | ORAL | Status: DC | PRN

## 2017-10-23 MED ORDER — CHLORHEXIDINE GLUCONATE CLOTH 2 % EX PADS: 6.0000 | MEDICATED_PAD | Freq: Once | CUTANEOUS | Status: DC

## 2017-10-23 MED ORDER — ONDANSETRON HCL 4 MG/2ML IJ SOLN
INTRAMUSCULAR | Status: DC | PRN
  Administered 2017-10-23: 4 mg via INTRAVENOUS

## 2017-10-23 MED ORDER — LACTATED RINGERS IV SOLN
INTRAVENOUS | Status: DC | PRN
  Administered 2017-10-23: 07:00:00 via INTRAVENOUS

## 2017-10-23 MED ORDER — PROPOFOL 10 MG/ML IV BOLUS
INTRAVENOUS | Status: DC | PRN
  Administered 2017-10-23: 130 mg via INTRAVENOUS

## 2017-10-23 MED ORDER — KCL IN DEXTROSE-NACL 20-5-0.45 MEQ/L-%-% IV SOLN
INTRAVENOUS | Status: DC
  Administered 2017-10-23: 11:00:00 via INTRAVENOUS
  Filled 2017-10-23 (×2): qty 1000

## 2017-10-23 MED ORDER — FENTANYL CITRATE (PF) 100 MCG/2ML IJ SOLN
INTRAMUSCULAR | Status: DC | PRN
  Administered 2017-10-23: 100 ug via INTRAVENOUS
  Administered 2017-10-23: 50 ug via INTRAVENOUS

## 2017-10-23 MED ORDER — GABAPENTIN 300 MG PO CAPS
300.0000 mg | ORAL_CAPSULE | Freq: Once | ORAL | Status: AC
  Administered 2017-10-23: 300 mg via ORAL
  Filled 2017-10-23: qty 1

## 2017-10-23 MED ORDER — FENTANYL CITRATE (PF) 250 MCG/5ML IJ SOLN
INTRAMUSCULAR | Status: AC
  Filled 2017-10-23: qty 5

## 2017-10-23 MED ORDER — TRAMADOL HCL 50 MG PO TABS
50.0000 mg | ORAL_TABLET | Freq: Four times a day (QID) | ORAL | Status: DC | PRN
  Administered 2017-10-23 – 2017-10-24 (×3): 50 mg via ORAL
  Filled 2017-10-23 (×3): qty 1

## 2017-10-23 MED ORDER — CEFAZOLIN SODIUM-DEXTROSE 2-4 GM/100ML-% IV SOLN
INTRAVENOUS | Status: AC
  Filled 2017-10-23: qty 100

## 2017-10-23 MED ORDER — ACETAMINOPHEN 500 MG PO TABS
1000.0000 mg | ORAL_TABLET | Freq: Once | ORAL | Status: AC
  Administered 2017-10-23: 1000 mg via ORAL
  Filled 2017-10-23: qty 2

## 2017-10-23 MED ORDER — SCOPOLAMINE 1 MG/3DAYS TD PT72: 1.0000 | MEDICATED_PATCH | Freq: Once | TRANSDERMAL | Status: DC

## 2017-10-23 MED ORDER — MIDAZOLAM HCL 2 MG/2ML IJ SOLN
INTRAMUSCULAR | Status: AC
  Filled 2017-10-23: qty 2

## 2017-10-23 MED ORDER — HYDROMORPHONE HCL 1 MG/ML IJ SOLN: 1.0000 mg | INTRAMUSCULAR | Status: DC | PRN

## 2017-10-23 MED ORDER — SUGAMMADEX SODIUM 200 MG/2ML IV SOLN
INTRAVENOUS | Status: DC | PRN
  Administered 2017-10-23: 150 mg via INTRAVENOUS

## 2017-10-23 MED ORDER — MIDAZOLAM HCL 5 MG/5ML IJ SOLN
INTRAMUSCULAR | Status: DC | PRN
  Administered 2017-10-23: 2 mg via INTRAVENOUS

## 2017-10-23 MED ORDER — ESCITALOPRAM OXALATE 10 MG PO TABS
5.0000 mg | ORAL_TABLET | Freq: Every day | ORAL | Status: DC
  Administered 2017-10-24: 5 mg via ORAL
  Filled 2017-10-23: qty 1

## 2017-10-23 SURGICAL SUPPLY — 36 items
ATTRACTOMAT 16X20 MAGNETIC DRP (DRAPES) ×3 IMPLANT
BLADE SURG 15 STRL LF DISP TIS (BLADE) ×1 IMPLANT
BLADE SURG 15 STRL SS (BLADE) ×3
CHLORAPREP W/TINT 26ML (MISCELLANEOUS) ×6 IMPLANT
CLIP VESOCCLUDE MED 6/CT (CLIP) ×6 IMPLANT
CLIP VESOCCLUDE SM WIDE 6/CT (CLIP) ×8 IMPLANT
CLOSURE WOUND 1/2 X4 (GAUZE/BANDAGES/DRESSINGS) ×1
COVER SURGICAL LIGHT HANDLE (MISCELLANEOUS) ×3 IMPLANT
DISSECTOR ROUND CHERRY 3/8 STR (MISCELLANEOUS) IMPLANT
DRAPE LAPAROTOMY T 98X78 PEDS (DRAPES) ×3 IMPLANT
ELECT PENCIL ROCKER SW 15FT (MISCELLANEOUS) ×3 IMPLANT
ELECT REM PT RETURN 15FT ADLT (MISCELLANEOUS) ×3 IMPLANT
GAUZE 4X4 16PLY RFD (DISPOSABLE) ×3 IMPLANT
GAUZE SPONGE 4X4 12PLY STRL (GAUZE/BANDAGES/DRESSINGS) ×2 IMPLANT
GLOVE SURG ORTHO 8.0 STRL STRW (GLOVE) ×3 IMPLANT
GOWN STRL REUS W/TWL XL LVL3 (GOWN DISPOSABLE) ×6 IMPLANT
HEMOSTAT SURGICEL 2X4 FIBR (HEMOSTASIS) IMPLANT
ILLUMINATOR WAVEGUIDE N/F (MISCELLANEOUS) ×2 IMPLANT
KIT BASIN OR (CUSTOM PROCEDURE TRAY) ×3 IMPLANT
LIGHT WAVEGUIDE WIDE FLAT (MISCELLANEOUS) IMPLANT
PACK BASIC VI WITH GOWN DISP (CUSTOM PROCEDURE TRAY) ×3 IMPLANT
POWDER SURGICEL 3.0 GRAM (HEMOSTASIS) IMPLANT
SHEARS HARMONIC 9CM CVD (BLADE) ×3 IMPLANT
STAPLER VISISTAT 35W (STAPLE) IMPLANT
STRIP CLOSURE SKIN 1/2X4 (GAUZE/BANDAGES/DRESSINGS) ×2 IMPLANT
SUT MNCRL AB 4-0 PS2 18 (SUTURE) ×3 IMPLANT
SUT SILK 2 0 (SUTURE)
SUT SILK 2-0 18XBRD TIE 12 (SUTURE) IMPLANT
SUT SILK 3 0 (SUTURE) ×3
SUT SILK 3-0 18XBRD TIE 12 (SUTURE) IMPLANT
SUT VIC AB 3-0 SH 18 (SUTURE) ×6 IMPLANT
SYR BULB IRRIGATION 50ML (SYRINGE) ×3 IMPLANT
TAPE CLOTH SURG 4X10 WHT LF (GAUZE/BANDAGES/DRESSINGS) ×2 IMPLANT
TOWEL OR 17X26 10 PK STRL BLUE (TOWEL DISPOSABLE) ×3 IMPLANT
TOWEL OR NON WOVEN STRL DISP B (DISPOSABLE) ×3 IMPLANT
YANKAUER SUCT BULB TIP 10FT TU (MISCELLANEOUS) ×3 IMPLANT

## 2017-10-23 NOTE — Anesthesia Procedure Notes (Signed)
Procedure Name: Intubation Date/Time: 10/23/2017 7:26 AM Performed by: West Pugh, CRNA Pre-anesthesia Checklist: Patient identified, Emergency Drugs available, Suction available, Patient being monitored and Timeout performed Patient Re-evaluated:Patient Re-evaluated prior to induction Oxygen Delivery Method: Circle system utilized Preoxygenation: Pre-oxygenation with 100% oxygen Induction Type: IV induction Ventilation: Mask ventilation without difficulty Laryngoscope Size: Mac and 3 Grade View: Grade I Tube type: Oral Tube size: 6.5 mm Number of attempts: 1 Airway Equipment and Method: Stylet Placement Confirmation: ETT inserted through vocal cords under direct vision,  positive ETCO2,  CO2 detector and breath sounds checked- equal and bilateral Secured at: 21 cm Tube secured with: Tape Dental Injury: Teeth and Oropharynx as per pre-operative assessment

## 2017-10-23 NOTE — Anesthesia Preprocedure Evaluation (Addendum)
Anesthesia Evaluation  Patient identified by MRN, date of birth, ID band Patient awake    Reviewed: Allergy & Precautions, NPO status , Patient's Chart, lab work & pertinent test results  History of Anesthesia Complications (+) PONV and history of anesthetic complications  Airway Mallampati: II  TM Distance: >3 FB Neck ROM: Full    Dental  (+) Teeth Intact, Dental Advisory Given   Pulmonary neg pulmonary ROS,    Pulmonary exam normal breath sounds clear to auscultation       Cardiovascular hypertension, Normal cardiovascular exam Rhythm:Regular Rate:Normal     Neuro/Psych PSYCHIATRIC DISORDERS Anxiety Depression negative neurological ROS     GI/Hepatic negative GI ROS, Neg liver ROS,   Endo/Other  Hyperthyroidism   Renal/GU negative Renal ROS     Musculoskeletal  (+) Arthritis ,   Abdominal   Peds  Hematology negative hematology ROS (+)   Anesthesia Other Findings Day of surgery medications reviewed with the patient.  Reproductive/Obstetrics                             Anesthesia Physical Anesthesia Plan  ASA: II  Anesthesia Plan: General   Post-op Pain Management:    Induction: Intravenous  PONV Risk Score and Plan: 4 or greater and Midazolam, Dexamethasone, Ondansetron, Diphenhydramine and Propofol infusion  Airway Management Planned: Oral ETT  Additional Equipment:   Intra-op Plan:   Post-operative Plan: Extubation in OR  Informed Consent: I have reviewed the patients History and Physical, chart, labs and discussed the procedure including the risks, benefits and alternatives for the proposed anesthesia with the patient or authorized representative who has indicated his/her understanding and acceptance.   Dental advisory given  Plan Discussed with: CRNA  Anesthesia Plan Comments:        Anesthesia Quick Evaluation

## 2017-10-23 NOTE — Transfer of Care (Signed)
Immediate Anesthesia Transfer of Care Note  Patient: Cassandra Mcfarland  Procedure(s) Performed: TOTAL THYROIDECTOMY (N/A Neck)  Patient Location: PACU  Anesthesia Type:General  Level of Consciousness: awake, alert  and patient cooperative  Airway & Oxygen Therapy: Patient Spontanous Breathing and Patient connected to face mask oxygen  Post-op Assessment: Report given to RN and Post -op Vital signs reviewed and stable  Post vital signs: Reviewed and stable  Last Vitals:  Vitals Value Taken Time  BP 167/94 10/23/2017  9:07 AM  Temp    Pulse 82 10/23/2017  9:10 AM  Resp 12 10/23/2017  9:10 AM  SpO2 100 % 10/23/2017  9:10 AM  Vitals shown include unvalidated device data.  Last Pain:  Vitals:   10/23/17 0647  TempSrc:   PainSc: 0-No pain      Patients Stated Pain Goal: 4 (10/68/16 6196)  Complications: No apparent anesthesia complications

## 2017-10-23 NOTE — Anesthesia Postprocedure Evaluation (Signed)
Anesthesia Post Note  Patient: Cassandra Mcfarland  Procedure(s) Performed: TOTAL THYROIDECTOMY (N/A Neck)     Patient location during evaluation: PACU Anesthesia Type: General Level of consciousness: awake and alert, oriented and awake Pain management: pain level controlled Vital Signs Assessment: post-procedure vital signs reviewed and stable Respiratory status: spontaneous breathing, nonlabored ventilation and respiratory function stable Cardiovascular status: blood pressure returned to baseline and stable Postop Assessment: no apparent nausea or vomiting Anesthetic complications: no    Last Vitals:  Vitals:   10/23/17 1016 10/23/17 1126  BP: (!) 169/87 (!) 156/84  Pulse: 79 82  Resp: 16 15  Temp: (!) 36.4 C (!) 36.4 C  SpO2: 98% 96%    Last Pain:  Vitals:   10/23/17 1140  TempSrc:   PainSc: 1                  Catalina Gravel

## 2017-10-23 NOTE — Op Note (Signed)
Procedure Note  Pre-operative Diagnosis:  Multinodular thyroid, hyperthyroidism  Post-operative Diagnosis:  same  Surgeon:  Armandina Gemma, MD  Assistant:  none   Procedure:  Total thyroidectomy  Anesthesia:  General  Estimated Blood Loss:  minimal  Drains: none         Specimen: thyroid to pathology  Indications:  Patient is referred by Dr. Jacelyn Pi for surgical evaluation and management of hyperthyroidism with bilateral thyroid nodules. Patient is known to my practice from parathyroidectomy performed approximately 8-10 years ago. Patient has done well from that procedure. A few months ago the patient developed oral discomfort. She was seen and evaluated by her primary care physician. She was noted to have a tremor. Laboratory studies showed a suppressed TSH level of 0.01. Patient was referred to endocrinology. She was treated initially with methimazole but unfortunately had a reaction to the medication. After discussion. Patient has opted to pursue surgical treatment. Ultrasound of the thyroid was performed on September 24, 2017. This showed a heterogeneous gland with the right lobe measuring 4.9 cm and left lobe measuring 4.0 cm. There were small bilateral thyroid nodules. Patient is not on beta blockade. Previous neck surgery for left inferior parathyroidectomy. No family history of endocrine neoplasm or malignancy.  Procedure Details: Procedure was done in OR #4 at the [facility].  The patient was brought to the operating room and placed in a supine position on the operating room table.  Following administration of general anesthesia, the patient was positioned and then prepped and draped in the usual aseptic fashion.  After ascertaining that an adequate level of anesthesia had been achieved, a Kocher incision was made with #15 blade.  Dissection was carried through subcutaneous tissues and platysma. Hemostasis was achieved with the electrocautery.  Skin flaps were  elevated cephalad and caudad from the thyroid notch to the sternal notch.  The Mahorner self-retaining retractor was placed for exposure.  Strap muscles were incised in the midline and dissection was begun on the left side.  Strap muscles were reflected laterally.  Left thyroid lobe was normal in size.  There was some scar tissue along the inferior aspect and small ligaclips were noted on the thyroid capsule.  The left lobe was gently mobilized with blunt dissection.  Superior pole vessels were dissected out and divided individually between small and medium Ligaclips with the Harmonic scalpel.  The thyroid lobe was rolled anteriorly.  Branches of the inferior thyroid artery were divided between small Ligaclips with the Harmonic scalpel.  Inferior venous tributaries were divided between Ligaclips.  Both the superior and inferior parathyroid glands were identified and preserved on their vascular pedicles.  The recurrent laryngeal nerve was identified and preserved along its course.  The ligament of Gwenlyn Found was released with the electrocautery and the gland was mobilized onto the anterior trachea. Isthmus was mobilized across the midline.  There was a small pyramidal lobe present which was resected with the isthmus.  Dry pack was placed in the left neck.  Next, the right thyroid lobe was gently mobilized with blunt dissection.  Right thyroid lobe was slightly larger than the left and multinodular.  Superior pole vessels were dissected out and divided between small and medium Ligaclips with the Harmonic scalpel.  Superior parathyroid was identified and preserved.  Inferior venous tributaries were divided between medium Ligaclips with the Harmonic scalpel.  The right thyroid lobe was rolled anteriorly and the branches of the inferior thyroid artery divided between small Ligaclips.  The right recurrent  laryngeal nerve was identified and preserved along its course.  The ligament of Gwenlyn Found was released with the  electrocautery.  The right thyroid lobe was mobilized onto the anterior trachea and the remainder of the thyroid was dissected off the anterior trachea and the thyroid was completely excised.  A suture was used to mark the right lobe. The entire thyroid gland was submitted to pathology for review.  The neck was irrigated with warm saline.  Fibrillar was placed throughout the operative field.  Strap muscles were reapproximated in the midline with interrupted 3-0 Vicryl sutures.  Platysma was closed with interrupted 3-0 Vicryl sutures.  Skin was closed with a running 4-0 Monocryl subcuticular suture.  Wound was washed and dried and steri-strips were applied.  Dry gauze dressing was placed.  The patient was awakened from anesthesia and brought to the recovery room.  The patient tolerated the procedure well.   Armandina Gemma, MD [facility] Office: [phone]

## 2017-10-23 NOTE — Interval H&P Note (Signed)
History and Physical Interval Note:  10/23/2017 7:00 AM  Cassandra Mcfarland  has presented today for surgery, with the diagnosis of HYPERTHYROIDISM, BILATERAL THYROID NODULES.  The various methods of treatment have been discussed with the patient and family. After consideration of risks, benefits and other options for treatment, the patient has consented to    Procedure(s): TOTAL THYROIDECTOMY (N/A) as a surgical intervention .    The patient's history has been reviewed, patient examined, no change in status, stable for surgery.  I have reviewed the patient's chart and labs.  Questions were answered to the patient's satisfaction.    Armandina Gemma, [place] [facility] Office: [place]

## 2017-10-24 ENCOUNTER — Encounter (HOSPITAL_COMMUNITY): Payer: Self-pay | Admitting: Surgery

## 2017-10-24 DIAGNOSIS — C73 Malignant neoplasm of thyroid gland: Secondary | ICD-10-CM | POA: Diagnosis not present

## 2017-10-24 LAB — BASIC METABOLIC PANEL
Anion gap: 7 (ref 5–15)
BUN: 13 mg/dL (ref 8–23)
CHLORIDE: 105 mmol/L (ref 98–111)
CO2: 28 mmol/L (ref 22–32)
Calcium: 9.5 mg/dL (ref 8.9–10.3)
Creatinine, Ser: 0.79 mg/dL (ref 0.44–1.00)
GFR calc non Af Amer: >60 mL/min (ref >60)
Glucose, Bld: 113 mg/dL — ABNORMAL HIGH (ref 70–99)
POTASSIUM: 4.3 mmol/L (ref 3.5–5.1)
SODIUM: 140 mmol/L (ref 135–145)

## 2017-10-24 MED ORDER — TRAMADOL HCL 50 MG PO TABS: 50.0000 mg | ORAL_TABLET | Freq: Four times a day (QID) | ORAL | 0 refills | Status: DC | PRN

## 2017-10-24 MED ORDER — CALCIUM CARBONATE-VITAMIN D 500-200 MG-UNIT PO TABS: 2.0000 | ORAL_TABLET | Freq: Two times a day (BID) | ORAL | 0 refills | Status: DC

## 2017-10-24 NOTE — Progress Notes (Signed)
Discussed with patient and spouse discharge instructions, all questions were answered, they verbalized agreement and understanding. Patient to go home in private vehicle with all belongings.

## 2017-10-24 NOTE — Discharge Summary (Signed)
Physician Discharge Summary [facility]  Patient ID: Cassandra Mcfarland MRN: [id number] DOB/AGE: July 29, 1950 67 y.o.  Admit date: 10/23/2017 Discharge date: 10/24/2017  Admission Diagnoses:  Multiple thyroid nodules, hyperthyroidism  Discharge Diagnoses:  Principal Problem:   Hyperthyroidism Active Problems:   Multiple thyroid nodules   Discharged Condition: good  Hospital Course: Patient was admitted for observation following thyroid surgery.  Post op course was uncomplicated.  Pain was well controlled.  Tolerated diet.  Post op calcium level on morning following surgery was 9.5 mg/dl.  Patient was prepared for discharge home on POD#1.  Consults: None  Treatments: surgery: total thyroidectomy  Discharge Exam: Blood pressure (!) 149/77, pulse 65, temperature 98.2 F (36.8 C), temperature source Oral, resp. rate 18, height 5\' 5"  (1.651 m), weight 63.4 kg, last menstrual period 11/10/2014, SpO2 98 %. HEENT - clear Neck - wound dry and intact; minimal STS; voice normal Chest - clear bilaterally Cor - RRR   Disposition: Home  Discharge Instructions    Diet - low sodium heart healthy   Complete by:  As directed    Discharge instructions   Complete by:  As directed    [facility]  THYROID & PARATHYROID SURGERY:  POST-OP INSTRUCTIONS  Always review your discharge instruction sheet from the facility where your surgery was performed.  A prescription for pain medication may be given to you upon discharge.  Take your pain medication as prescribed.  If narcotic pain medicine is not needed, then you may take acetaminophen (Tylenol) or ibuprofen (Advil) as needed.  Take your usually prescribed medications unless otherwise directed.  If you need a refill on your pain medication, please contact our office during regular business hours.  Prescriptions cannot be processed by our office after 5 pm or on weekends.  Start with a light diet upon  arrival home, such as soup and crackers or toast.  Be sure to drink plenty of fluids daily.  Resume your normal diet the day after surgery.  Most patients will experience some swelling and bruising on the chest and neck area.  Ice packs will help.  Swelling and bruising can take several days to resolve.   It is common to experience some constipation after surgery.  Increasing fluid intake and taking a stool softener (Colace) will usually help or prevent this problem.  A mild laxative (Milk of Magnesia or Miralax) should be taken according to package directions if there has been no bowel movement after 48 hours.  You have steri-strips and a gauze dressing over your incision.  You may remove the gauze bandage on the second day after surgery, and you may shower at that time.  Leave your steri-strips (small skin tapes) in place directly over the incision.  These strips should remain on the skin for 5-7 days and then be removed.  You may get them wet in the shower and pat them dry.  You may resume regular (light) daily activities beginning the next day (such as daily self-care, walking, climbing stairs) gradually increasing activities as tolerated.  You may have sexual intercourse when it is comfortable.  Refrain from any heavy lifting or straining until approved by your doctor.  You may drive when you no longer are taking prescription pain medication, you can comfortably wear a seatbelt, and you can safely maneuver your car and apply brakes.  You should see your doctor in the office for a follow-up appointment approximately three weeks after your surgery.  Make  sure that you call for this appointment within a day or two after you arrive home to insure a convenient appointment time.  WHEN TO CALL YOUR DOCTOR: -- Fever greater than 101.5 -- Inability to urinate -- Nausea and/or vomiting - persistent -- Extreme swelling or bruising -- Continued bleeding from incision -- Increased pain, redness, or  drainage from the incision -- Difficulty swallowing or breathing -- Muscle cramping or spasms -- Numbness or tingling in hands or around lips  The clinic staff is available to answer your questions during regular business hours.  Please don't hesitate to call and ask to speak to one of the nurses if you have concerns.  Armandina Gemma, MD [facility] Office: [phone]   Increase activity slowly   Complete by:  As directed    Remove dressing in 24 hours   Complete by:  As directed      Allergies as of 10/24/2017      Reactions   Ciprofloxacin Dermatitis, Other (See Comments), Rash   Severe burning, blisters on toes   Tetanus-diphtheria Toxoids Td Swelling   SWELLING REACTION UNSPECIFIED  [LOCAL ? Vs SYSTEMIC ?]   Codeine Nausea And Vomiting   Methimazole Itching, Rash   Blisters      Medication List    TAKE these medications   ALPRAZolam 0.5 MG tablet Commonly known as:  XANAX Take 1 tablet (0.5 mg total) by mouth at bedtime as needed.   calcium-vitamin D 500-200 MG-UNIT tablet Commonly known as:  OSCAL WITH D Take 2 tablets by mouth 2 (two) times daily.   diazepam 5 MG tablet Commonly known as:  VALIUM Take 1 tablet (5 mg total) by mouth every 12 (twelve) hours as needed for anxiety.   escitalopram 5 MG tablet Commonly known as:  LEXAPRO Take 1 tablet (5 mg total) by mouth daily.   ezetimibe 10 MG tablet Commonly known as:  ZETIA TAKE 1 TABLET(10 MG) BY MOUTH DAILY   traMADol 50 MG tablet Commonly known as:  ULTRAM Take 1-2 tablets (50-100 mg total) by mouth every 6 (six) hours as needed.   VAGIFEM 10 MCG Tabs vaginal tablet Generic drug:  Estradiol Place 10 mcg vaginally 2 (two) times a week. On Sunday and Thursday      Follow-up Information    Armandina Gemma, MD. Schedule an appointment as soon as possible for a visit in 3 week(s).   Specialty:  General Surgery Contact information: [address] [place]  [address] [phone]        Jacelyn Pi, MD. Schedule an appointment as soon as possible for a visit in 3 week(s).   Specialty:  Endocrinology Contact information: [address] [place] [address] [phone]           Earnstine Regal, MD, [facility] Office: [phone]   Signed: Earnstine Regal 10/24/2017, 8:00 AM

## 2017-10-26 ENCOUNTER — Other Ambulatory Visit: Payer: Self-pay | Admitting: Internal Medicine

## 2017-11-01 ENCOUNTER — Other Ambulatory Visit: Payer: Self-pay | Admitting: Internal Medicine

## 2017-11-01 NOTE — Telephone Encounter (Signed)
Copied from [place] [id number]. Topic: General - Other >> Nov 01, 2017 11:49 AM Yvette Rack wrote: Reason for CRM: pt calling about the RX ALPRAZolam Duanne Moron) 0.5 MG tablet she states that the pharmacy says that it was denied but it looks like it was printed out please call pt to let her know if this medicine will be filled

## 2017-11-12 DIAGNOSIS — E0590 Thyrotoxicosis, unspecified without thyrotoxic crisis or storm: Secondary | ICD-10-CM | POA: Diagnosis not present

## 2017-11-14 DIAGNOSIS — E890 Postprocedural hypothyroidism: Secondary | ICD-10-CM | POA: Diagnosis not present

## 2017-12-18 ENCOUNTER — Encounter: Payer: Self-pay | Admitting: Adult Health

## 2017-12-18 ENCOUNTER — Ambulatory Visit (INDEPENDENT_AMBULATORY_CARE_PROVIDER_SITE_OTHER): Payer: PPO | Admitting: Adult Health

## 2017-12-18 VITALS — BP 164/94 | Temp 98.3°F | Wt 133.0 lb

## 2017-12-18 DIAGNOSIS — J40 Bronchitis, not specified as acute or chronic: Secondary | ICD-10-CM | POA: Diagnosis not present

## 2017-12-18 MED ORDER — PREDNISONE 10 MG PO TABS: ORAL_TABLET | ORAL | 0 refills | Status: DC

## 2017-12-18 MED ORDER — IPRATROPIUM-ALBUTEROL 0.5-2.5 (3) MG/3ML IN SOLN: 3.0000 mL | Freq: Once | RESPIRATORY_TRACT | Status: DC

## 2017-12-18 MED ORDER — HYDROCODONE-HOMATROPINE 5-1.5 MG/5ML PO SYRP: 5.0000 mL | ORAL_SOLUTION | Freq: Three times a day (TID) | ORAL | 0 refills | Status: DC | PRN

## 2017-12-18 NOTE — Progress Notes (Signed)
Subjective:    Patient ID: Cassandra Mcfarland, female    DOB: 1951/01/16, 67 y.o.   MRN: [id number]  HPI  67 year old female who  has a past medical history of Anemia, Anxiety, Complication of anesthesia, Depression, History of urinary tract infection, Hypertension, Osteoarthritis, PONV (postoperative nausea and vomiting), Situational anxiety (09/2003), Situational depression, and Thyroid disease.  She presents to the office today for an acute issue of dry cough x 3 weeks. She denies any fevers, nausea, vomiting, sinus pain/pressure, or body aches. Associated symptoms include that of   She has tried various OTC treatments such as Dayquil/Nyquil and Delsym over the last week but has not noticed any changes.   Cough is keeping her awake and is worse when she lays down   She does endorse feeling SOB during her cough spells, questionable wheezing   Review of Systems See HPI   Past Medical History:  Diagnosis Date  . Anemia    after knee surgery ( only time)  . Anxiety   . Complication of anesthesia   . Depression   . History of urinary tract infection   . Hypertension    no longer on medications  since knee surgery and pain resolved  . Osteoarthritis   . PONV (postoperative nausea and vomiting)   . Situational anxiety 09/2003  . Situational depression   . Thyroid disease    hyper parathyroidism    Social History   Socioeconomic History  . Marital status: Married    Spouse name: Not on file  . Number of children: Not on file  . Years of education: Not on file  . Highest education level: Not on file  Occupational History  . Not on file  Social Needs  . Financial resource strain: Not on file  . Food insecurity:    Worry: Not on file    Inability: Not on file  . Transportation needs:    Medical: Not on file    Non-medical: Not on file  Tobacco Use  . Smoking status: Never Smoker  . Smokeless tobacco: Never Used  Substance and Sexual Activity  . Alcohol use: Yes   Comment: occas glass of wine   . Drug use: No  . Sexual activity: Yes    Partners: Male    Birth control/protection: Post-menopausal  Lifestyle  . Physical activity:    Days per week: Not on file    Minutes per session: Not on file  . Stress: Not on file  Relationships  . Social connections:    Talks on phone: Not on file    Gets together: Not on file    Attends religious service: Not on file    Active member of club or organization: Not on file    Attends meetings of clubs or organizations: Not on file    Relationship status: Not on file  . Intimate partner violence:    Fear of current or ex partner: Not on file    Emotionally abused: Not on file    Physically abused: Not on file    Forced sexual activity: Not on file  Other Topics Concern  . Not on file  Social History Narrative  . Not on file    Past Surgical History:  Procedure Laterality Date  . abddominoplasty  1995  . APPENDECTOMY  Age 73  . BREAST REDUCTION SURGERY  09/2001   & Lift  . COLONOSCOPY    . EYE SURGERY     cataract bil  .  ORIF ELBOW FRACTURE Left 03/15/2017   Procedure: OPEN REDUCTION INTERNAL FIXATION (ORIF) ELBOW/OLECRANON FRACTURE;  Surgeon: Altamese Clio, MD;  Location: [facility];  Service: Orthopedics;  Laterality: Left;  . PARATHYROIDECTOMY  5/1/112   due to hypercalcemia  . SHOULDER SURGERY     left rotator cuff; bicep tendon repair on left   . THUMB FUSION  08/2007   with bone graft- post trauma Dr. Loney Loh  . THYROIDECTOMY N/A 10/23/2017   Procedure: TOTAL THYROIDECTOMY;  Surgeon: Armandina Gemma, MD;  Location: [facility];  Service: General;  Laterality: N/A;  . toatal knee replacement Right 2001  . TOTAL KNEE ARTHROPLASTY Left 08/02/2015   Procedure: LEFT TOTAL KNEE ARTHROPLASTY;  Surgeon: Gaynelle Arabian, MD;  Location: [facility];  Service: Orthopedics;  Laterality: Left;  . TOTAL THYROIDECTOMY     [place]  . TUBAL LIGATION  1994    Family History  Problem Relation Age of Onset  . Coronary artery  disease Father   . Heart disease Father   . Heart attack Father   . Coronary artery disease Brother   . Hypertension Brother   . Cancer Mother   . Leukemia Mother 7  . Osteoporosis Mother   . Hypertension Sister   . Osteoporosis Sister   . Hypertension Brother   . Osteoporosis Maternal Aunt     Allergies  Allergen Reactions  . Ciprofloxacin Dermatitis, Other (See Comments) and Rash    Severe burning, blisters on toes  . Tetanus-Diphtheria Toxoids Td Swelling    SWELLING REACTION UNSPECIFIED  [LOCAL ? Vs SYSTEMIC ?]  . Codeine Nausea And Vomiting  . Methimazole Itching and Rash    Blisters    Current Outpatient Medications on File Prior to Visit  Medication Sig Dispense Refill  . ALPRAZolam (XANAX) 0.5 MG tablet TAKE 1 TABLET BY MOUTH EVERY NIGHT AT BEDTIME AS NEEDED 90 tablet 0  . calcium-vitamin D (OSCAL WITH D) 500-200 MG-UNIT tablet Take 2 tablets by mouth 2 (two) times daily. 90 tablet 0  . diazepam (VALIUM) 5 MG tablet Take 1 tablet (5 mg total) by mouth every 12 (twelve) hours as needed for anxiety. 60 tablet 0  . escitalopram (LEXAPRO) 5 MG tablet Take 1 tablet (5 mg total) by mouth daily. 90 tablet 0  . Estradiol (VAGIFEM) 10 MCG TABS vaginal tablet Place 10 mcg vaginally 2 (two) times a week. On Sunday and Thursday    . ezetimibe (ZETIA) 10 MG tablet TAKE 1 TABLET(10 MG) BY MOUTH DAILY 90 tablet 4  . traMADol (ULTRAM) 50 MG tablet Take 1-2 tablets (50-100 mg total) by mouth every 6 (six) hours as needed. 15 tablet 0   No current facility-administered medications on file prior to visit.     LMP 11/10/2014 (Exact Date)       Objective:   Physical Exam  Constitutional: She is oriented to person, place, and time. She appears well-developed and well-nourished. No distress.  HENT:  Nose: Nose normal. Right sinus exhibits no maxillary sinus tenderness and no frontal sinus tenderness. Left sinus exhibits no maxillary sinus tenderness and no frontal sinus tenderness.    Mouth/Throat: Uvula is midline, oropharynx is clear and moist and mucous membranes are normal.  Eyes: Pupils are equal, round, and reactive to light. Conjunctivae are normal. Right eye exhibits no discharge. Left eye exhibits no discharge. No scleral icterus.  Cardiovascular: Normal rate, regular rhythm, normal heart sounds and intact distal pulses.  Pulmonary/Chest: Effort normal. She has wheezes.  Expiratory wheezing throughout  Neurological: She is alert and oriented to person, place, and time.  Skin: Skin is warm and dry. She is not diaphoretic.  Psychiatric: She has a normal mood and affect. Her behavior is normal. Judgment and thought content normal.  Nursing note and vitals reviewed.     Assessment & Plan:  1. Bronchitis - predniSONE (DELTASONE) 10 MG tablet; 40 mg x 3 days, 20 mg x 3 days, 10 mg x 3 days  Dispense: 21 tablet; Refill: 0 - HYDROcodone-homatropine (HYCODAN) 5-1.5 MG/5ML syrup; Take 5 mLs by mouth every 8 (eight) hours as needed for cough.  Dispense: 120 mL; Refill: 0 - ipratropium-albuterol (DUONEB) 0.5-2.5 (3) MG/3ML nebulizer solution 3 mL  - Patient reported improvement in SOB and cough after duo neb. On exam wheezing had resolved and she was able to move oxygen easier.   Follow up if no improvement in the next 2-3 days   Dorothyann Peng, NP

## 2017-12-21 ENCOUNTER — Other Ambulatory Visit: Payer: Self-pay | Admitting: Internal Medicine

## 2017-12-26 DIAGNOSIS — E890 Postprocedural hypothyroidism: Secondary | ICD-10-CM | POA: Diagnosis not present

## 2017-12-26 DIAGNOSIS — C73 Malignant neoplasm of thyroid gland: Secondary | ICD-10-CM | POA: Diagnosis not present

## 2018-01-16 ENCOUNTER — Encounter: Payer: Self-pay | Admitting: Internal Medicine

## 2018-01-16 ENCOUNTER — Ambulatory Visit (INDEPENDENT_AMBULATORY_CARE_PROVIDER_SITE_OTHER): Payer: PPO | Admitting: Internal Medicine

## 2018-01-16 VITALS — BP 140/90 | HR 66 | Temp 97.7°F | Wt 135.3 lb

## 2018-01-16 DIAGNOSIS — E032 Hypothyroidism due to medicaments and other exogenous substances: Secondary | ICD-10-CM

## 2018-01-16 DIAGNOSIS — C73 Malignant neoplasm of thyroid gland: Secondary | ICD-10-CM

## 2018-01-16 DIAGNOSIS — E785 Hyperlipidemia, unspecified: Secondary | ICD-10-CM | POA: Diagnosis not present

## 2018-01-16 DIAGNOSIS — E0590 Thyrotoxicosis, unspecified without thyrotoxic crisis or storm: Secondary | ICD-10-CM

## 2018-01-16 DIAGNOSIS — F320 Major depressive disorder, single episode, mild: Secondary | ICD-10-CM | POA: Diagnosis not present

## 2018-01-16 DIAGNOSIS — E042 Nontoxic multinodular goiter: Secondary | ICD-10-CM | POA: Diagnosis not present

## 2018-01-16 DIAGNOSIS — I10 Essential (primary) hypertension: Secondary | ICD-10-CM | POA: Diagnosis not present

## 2018-01-16 DIAGNOSIS — R059 Cough, unspecified: Secondary | ICD-10-CM

## 2018-01-16 DIAGNOSIS — R05 Cough: Secondary | ICD-10-CM

## 2018-01-16 DIAGNOSIS — Z23 Encounter for immunization: Secondary | ICD-10-CM

## 2018-01-16 NOTE — Progress Notes (Signed)
Established Patient Office Visit     CC/Reason for Visit: Establish care, cough  HPI: Cassandra Mcfarland is a 67 y.o. female who is coming in today for the above mentioned reasons.  Had annual physical in March 2019 . Past Medical History is significant for: Multinodular goiter with clinical hypothyroidism who recently underwent total thyroidectomy in August 2019.  Now hypothyroid on Synthroid.  Followed by Dr. Chalmers Cater with endocrinology.  Also history of hypertension (taken off blood pressure medication 2 years ago), hyperlipidemia.  Today she is complaining of a cough.  She was seen in this office in October for URI symptoms and was diagnosed with acute bronchitis.  She completed a prednisone course and has been using over-the-counter antitussives.  All of her symptoms have resolved with the exception of an occasional but persistent dry cough.  She denies fevers, chills, shortness of breath, runny nose.  Requesting flu shot today.   Past Medical/Surgical History: Past Medical History:  Diagnosis Date  . Anemia    after knee surgery ( only time)  . Anxiety   . Complication of anesthesia   . Depression   . History of urinary tract infection   . Hypertension    no longer on medications  since knee surgery and pain resolved  . Osteoarthritis   . PONV (postoperative nausea and vomiting)   . Situational anxiety 09/2003  . Situational depression   . Thyroid disease    hyper parathyroidism    Past Surgical History:  Procedure Laterality Date  . abddominoplasty  1995  . APPENDECTOMY  Age 5  . BREAST REDUCTION SURGERY  09/2001   & Lift  . COLONOSCOPY    . EYE SURGERY     cataract bil  . ORIF ELBOW FRACTURE Left 03/15/2017   Procedure: OPEN REDUCTION INTERNAL FIXATION (ORIF) ELBOW/OLECRANON FRACTURE;  Surgeon: Altamese Elysian, MD;  Location: [facility];  Service: Orthopedics;  Laterality: Left;  . PARATHYROIDECTOMY  5/1/112   due to hypercalcemia  . SHOULDER SURGERY     left rotator cuff;  bicep tendon repair on left   . THUMB FUSION  08/2007   with bone graft- post trauma Dr. Loney Loh  . THYROIDECTOMY N/A 10/23/2017   Procedure: TOTAL THYROIDECTOMY;  Surgeon: Armandina Gemma, MD;  Location: [facility];  Service: General;  Laterality: N/A;  . toatal knee replacement Right 2001  . TOTAL KNEE ARTHROPLASTY Left 08/02/2015   Procedure: LEFT TOTAL KNEE ARTHROPLASTY;  Surgeon: Gaynelle Arabian, MD;  Location: [facility];  Service: Orthopedics;  Laterality: Left;  . TOTAL THYROIDECTOMY     [place]  . TUBAL LIGATION  1994    Social History:  reports that she has never smoked. She has never used smokeless tobacco. She reports that she drinks alcohol. She reports that she does not use drugs.  Allergies: Allergies  Allergen Reactions  . Ciprofloxacin Dermatitis, Other (See Comments) and Rash    Severe burning, blisters on toes  . Tetanus-Diphtheria Toxoids Td Swelling    SWELLING REACTION UNSPECIFIED  [LOCAL ? Vs SYSTEMIC ?]  . Codeine Nausea And Vomiting  . Methimazole Itching and Rash    Blisters    Family History:  Family History  Problem Relation Age of Onset  . Coronary artery disease Father   . Heart disease Father   . Heart attack Father   . Coronary artery disease Brother   . Hypertension Brother   . Cancer Mother   . Leukemia Mother 24  . Osteoporosis Mother   .  Hypertension Sister   . Osteoporosis Sister   . Hypertension Brother   . Osteoporosis Maternal Aunt      Current Outpatient Medications:  .  ALPRAZolam (XANAX) 0.5 MG tablet, TAKE 1 TABLET BY MOUTH EVERY NIGHT AT BEDTIME AS NEEDED, Disp: 90 tablet, Rfl: 0 .  diazepam (VALIUM) 5 MG tablet, Take 1 tablet (5 mg total) by mouth every 12 (twelve) hours as needed for anxiety., Disp: 60 tablet, Rfl: 0 .  escitalopram (LEXAPRO) 5 MG tablet, TAKE 1 TABLET(5 MG) BY MOUTH DAILY, Disp: 90 tablet, Rfl: 0 .  Estradiol (VAGIFEM) 10 MCG TABS vaginal tablet, Place 10 mcg vaginally 2 (two) times a week. On Sunday and Thursday,  Disp: , Rfl:  .  ezetimibe (ZETIA) 10 MG tablet, TAKE 1 TABLET(10 MG) BY MOUTH DAILY, Disp: 90 tablet, Rfl: 4 .  levothyroxine (SYNTHROID, LEVOTHROID) 100 MCG tablet, TK 1 T PO QD IN THE MORNING OES, Disp: , Rfl: 2  Current Facility-Administered Medications:  .  ipratropium-albuterol (DUONEB) 0.5-2.5 (3) MG/3ML nebulizer solution 3 mL, 3 mL, Nebulization, Once, Nafziger, Cory, NP  Review of Systems:  Constitutional: Denies fever, chills, diaphoresis, appetite change and fatigue.  HEENT: Denies photophobia, eye pain, redness, hearing loss, ear pain, congestion, sore throat, rhinorrhea, sneezing, mouth sores, trouble swallowing, neck pain, neck stiffness and tinnitus.   Respiratory: Denies SOB, DOE, chest tightness,  and wheezing.   Cardiovascular: Denies chest pain, palpitations and leg swelling.  Gastrointestinal: Denies nausea, vomiting, abdominal pain, diarrhea, constipation, blood in stool and abdominal distention.  Genitourinary: Denies dysuria, urgency, frequency, hematuria, flank pain and difficulty urinating.  Endocrine: Denies: hot or cold intolerance, sweats, changes in hair or nails, polyuria, polydipsia. Musculoskeletal: Denies myalgias, back pain, joint swelling, arthralgias and gait problem.  Skin: Denies pallor, rash and wound.  Neurological: Denies dizziness, seizures, syncope, weakness, light-headedness, numbness and headaches.  Hematological: Denies adenopathy. Easy bruising, personal or family bleeding history  Psychiatric/Behavioral: Denies suicidal ideation, mood changes, confusion, nervousness, sleep disturbance and agitation    Physical Exam: Vitals:   01/16/18 1450  BP: 140/90  Pulse: 66  Temp: 97.7 F (36.5 C)  TempSrc: Oral  SpO2: 97%  Weight: 135 lb 4.8 oz (61.4 kg)    Body mass index is 22.52 kg/m.   Constitutional: NAD, calm, comfortable Eyes: PERRL, lids and conjunctivae normal ENMT: Mucous membranes are moist. Posterior pharynx erythematous but  without exudates.  Normal dentition. T Neck: normal, supple, no masses, no thyromegaly Respiratory: clear to auscultation bilaterally, no wheezing, no crackles. Normal respiratory effort. No accessory muscle use.  Cardiovascular: Regular rate and rhythm, no murmurs / rubs / gallops. No extremity edema. 2+ pedal pulses. No carotid bruits.  Abdomen: no tenderness, no masses palpated. No hepatosplenomegaly. Bowel sounds positive.  Musculoskeletal: no clubbing / cyanosis. No joint deformity upper and lower extremities. Good ROM, no contractures. Normal muscle tone.  Skin: no rashes, lesions, ulcers. No induration Neurologic: CN 2-12 grossly intact. Sensation intact, DTR normal. Strength 5/5 in all 4.  Psychiatric: Normal judgment and insight. Alert and oriented x 3. Normal mood.    Impression and Plan:  Cough -Suspect a post viral cough. -Have advised to use Delsym, without decongestants due to elevated blood pressure, can last up to 6 weeks after bronchitis has resolved. -She has been asked to call us if cough is unrelenting, may consider Tussionex prescription at that time.  Dyslipidemia -On Zetia -LDL 116 in 3/19 -Consider statin, will discuss at time of physical.  Essential hypertension -Very  uncontrolled today, initial blood pressure measurement 140/90, subsequently 180/100. -Not on medications -Patient is advised to do ambulatory blood pressure monitoring, I will also see her back in 6 weeks for blood pressure follow-up at which time, if BP still elevated, will likely need to initiate antihypertensive agents.  Hyperthyroidism Multiple thyroid nodules -Had thyroidectomy in August 2019, surgical pathology ultimately resulted in papillary thyroid carcinoma. -Now with iatrogenic hypothyroidism, on Synthroid followed by Dr. Suzette Battiest. -We will obtain labs from endocrinology's office.  Depression -On Lexapro     Patient Instructions  -Follow-up in 6 weeks for blood pressure  check.  -Schedule your annual physical after March 2019.  -Make sure you check your blood pressure at home at least once a day and bring these measurements to your next visit.     Lelon Frohlich, MD [place] [facility]

## 2018-01-16 NOTE — Patient Instructions (Signed)
-  Follow-up in 6 weeks for blood pressure check.  -Schedule your annual physical after March 2019.  -Make sure you check your blood pressure at home at least once a day and bring these measurements to your next visit.

## 2018-01-22 DIAGNOSIS — M9903 Segmental and somatic dysfunction of lumbar region: Secondary | ICD-10-CM | POA: Diagnosis not present

## 2018-01-22 DIAGNOSIS — M9902 Segmental and somatic dysfunction of thoracic region: Secondary | ICD-10-CM | POA: Diagnosis not present

## 2018-01-22 DIAGNOSIS — M9901 Segmental and somatic dysfunction of cervical region: Secondary | ICD-10-CM | POA: Diagnosis not present

## 2018-01-22 DIAGNOSIS — M542 Cervicalgia: Secondary | ICD-10-CM | POA: Diagnosis not present

## 2018-01-28 DIAGNOSIS — M9903 Segmental and somatic dysfunction of lumbar region: Secondary | ICD-10-CM | POA: Diagnosis not present

## 2018-01-28 DIAGNOSIS — M542 Cervicalgia: Secondary | ICD-10-CM | POA: Diagnosis not present

## 2018-01-28 DIAGNOSIS — M9902 Segmental and somatic dysfunction of thoracic region: Secondary | ICD-10-CM | POA: Diagnosis not present

## 2018-01-28 DIAGNOSIS — M9901 Segmental and somatic dysfunction of cervical region: Secondary | ICD-10-CM | POA: Diagnosis not present

## 2018-01-31 DIAGNOSIS — M9901 Segmental and somatic dysfunction of cervical region: Secondary | ICD-10-CM | POA: Diagnosis not present

## 2018-01-31 DIAGNOSIS — M542 Cervicalgia: Secondary | ICD-10-CM | POA: Diagnosis not present

## 2018-01-31 DIAGNOSIS — M9902 Segmental and somatic dysfunction of thoracic region: Secondary | ICD-10-CM | POA: Diagnosis not present

## 2018-01-31 DIAGNOSIS — M9903 Segmental and somatic dysfunction of lumbar region: Secondary | ICD-10-CM | POA: Diagnosis not present

## 2018-02-04 DIAGNOSIS — M9902 Segmental and somatic dysfunction of thoracic region: Secondary | ICD-10-CM | POA: Diagnosis not present

## 2018-02-04 DIAGNOSIS — M9901 Segmental and somatic dysfunction of cervical region: Secondary | ICD-10-CM | POA: Diagnosis not present

## 2018-02-04 DIAGNOSIS — M9903 Segmental and somatic dysfunction of lumbar region: Secondary | ICD-10-CM | POA: Diagnosis not present

## 2018-02-04 DIAGNOSIS — M542 Cervicalgia: Secondary | ICD-10-CM | POA: Diagnosis not present

## 2018-02-07 DIAGNOSIS — C73 Malignant neoplasm of thyroid gland: Secondary | ICD-10-CM | POA: Insufficient documentation

## 2018-02-07 DIAGNOSIS — Z85850 Personal history of malignant neoplasm of thyroid: Secondary | ICD-10-CM | POA: Insufficient documentation

## 2018-02-07 DIAGNOSIS — M8580 Other specified disorders of bone density and structure, unspecified site: Secondary | ICD-10-CM | POA: Diagnosis not present

## 2018-02-07 DIAGNOSIS — N816 Rectocele: Secondary | ICD-10-CM | POA: Diagnosis not present

## 2018-02-07 DIAGNOSIS — Z6821 Body mass index (BMI) 21.0-21.9, adult: Secondary | ICD-10-CM | POA: Diagnosis not present

## 2018-02-07 DIAGNOSIS — Z1231 Encounter for screening mammogram for malignant neoplasm of breast: Secondary | ICD-10-CM | POA: Diagnosis not present

## 2018-02-07 DIAGNOSIS — N952 Postmenopausal atrophic vaginitis: Secondary | ICD-10-CM | POA: Diagnosis not present

## 2018-02-07 DIAGNOSIS — Z01411 Encounter for gynecological examination (general) (routine) with abnormal findings: Secondary | ICD-10-CM | POA: Diagnosis not present

## 2018-02-11 ENCOUNTER — Other Ambulatory Visit: Payer: Self-pay | Admitting: Internal Medicine

## 2018-02-11 DIAGNOSIS — M9903 Segmental and somatic dysfunction of lumbar region: Secondary | ICD-10-CM | POA: Diagnosis not present

## 2018-02-11 DIAGNOSIS — M542 Cervicalgia: Secondary | ICD-10-CM | POA: Diagnosis not present

## 2018-02-11 DIAGNOSIS — M9901 Segmental and somatic dysfunction of cervical region: Secondary | ICD-10-CM | POA: Diagnosis not present

## 2018-02-11 DIAGNOSIS — M9902 Segmental and somatic dysfunction of thoracic region: Secondary | ICD-10-CM | POA: Diagnosis not present

## 2018-02-14 DIAGNOSIS — M9901 Segmental and somatic dysfunction of cervical region: Secondary | ICD-10-CM | POA: Diagnosis not present

## 2018-02-14 DIAGNOSIS — M542 Cervicalgia: Secondary | ICD-10-CM | POA: Diagnosis not present

## 2018-02-14 DIAGNOSIS — M9903 Segmental and somatic dysfunction of lumbar region: Secondary | ICD-10-CM | POA: Diagnosis not present

## 2018-02-14 DIAGNOSIS — M9902 Segmental and somatic dysfunction of thoracic region: Secondary | ICD-10-CM | POA: Diagnosis not present

## 2018-02-18 ENCOUNTER — Other Ambulatory Visit: Payer: Self-pay | Admitting: Internal Medicine

## 2018-02-18 ENCOUNTER — Telehealth: Payer: Self-pay | Admitting: Internal Medicine

## 2018-02-18 MED ORDER — ALPRAZOLAM 0.5 MG PO TABS: 0.5000 mg | ORAL_TABLET | Freq: Every evening | ORAL | 0 refills | Status: DC | PRN

## 2018-02-18 NOTE — Telephone Encounter (Signed)
Copied from [place] [id number]. Topic: Quick Communication - Rx Refill/Question >> Feb 18, 2018 10:21 AM Yvette Rack wrote: Medication: ALPRAZolam Duanne Moron) 0.5 MG tablet  Has the patient contacted their pharmacy? Yes.  Last week Tuesday or Wednesday and today  (Agent: If no, request that the patient contact the pharmacy for the refill.) (Agent: If yes, when and what did the pharmacy advise?) they haven't heard from provider  Preferred Pharmacy (with phone number or street name):   [facility] [place], [place] - [phone] (Phone) [phone] (Fax)    Agent: Please be advised that RX refills may take up to 3 business days. We ask that you follow-up with your pharmacy.

## 2018-02-18 NOTE — Telephone Encounter (Signed)
Will refill once. Will need to further discuss before I fill it again. We did not talk about BDZ Rx at last visit.

## 2018-02-21 DIAGNOSIS — M9903 Segmental and somatic dysfunction of lumbar region: Secondary | ICD-10-CM | POA: Diagnosis not present

## 2018-02-21 DIAGNOSIS — M9902 Segmental and somatic dysfunction of thoracic region: Secondary | ICD-10-CM | POA: Diagnosis not present

## 2018-02-21 DIAGNOSIS — M542 Cervicalgia: Secondary | ICD-10-CM | POA: Diagnosis not present

## 2018-02-21 DIAGNOSIS — M9901 Segmental and somatic dysfunction of cervical region: Secondary | ICD-10-CM | POA: Diagnosis not present

## 2018-02-21 NOTE — Telephone Encounter (Signed)
Patient states that she has contacted pharmacy multiple times about this medication and pharmacy states they have not received RX. Please advise.

## 2018-02-22 NOTE — Telephone Encounter (Signed)
Cassandra Mcfarland nor Cassandra Mcfarland are here. Can we take care of this today?

## 2018-02-22 NOTE — Telephone Encounter (Signed)
Pt states that the pharmacy does not have her xanax. Pt checking status on her med refill.

## 2018-02-22 NOTE — Telephone Encounter (Signed)
It looks like this Rx was printed. I am unsure if this was given to the patient or printed by mistake. I am unable to E-scribe this medication and without knowing if the patient received and paper copy and permission from the provider I can not Call this medication in.

## 2018-02-22 NOTE — Telephone Encounter (Signed)
Please see below.  Did she receive this prescription already?

## 2018-02-25 MED ORDER — ALPRAZOLAM 0.5 MG PO TABS: 0.5000 mg | ORAL_TABLET | Freq: Every evening | ORAL | 0 refills | Status: DC | PRN

## 2018-02-25 NOTE — Telephone Encounter (Signed)
Rx called in 

## 2018-02-25 NOTE — Addendum Note (Signed)
Addended by: Westley Hummer B on: 02/25/2018 11:23 AM   Modules accepted: Orders

## 2018-03-05 ENCOUNTER — Encounter: Payer: Self-pay | Admitting: Internal Medicine

## 2018-03-05 ENCOUNTER — Ambulatory Visit (INDEPENDENT_AMBULATORY_CARE_PROVIDER_SITE_OTHER): Payer: PPO | Admitting: Internal Medicine

## 2018-03-05 VITALS — BP 120/80 | HR 88 | Temp 98.1°F | Wt 133.8 lb

## 2018-03-05 DIAGNOSIS — I10 Essential (primary) hypertension: Secondary | ICD-10-CM | POA: Diagnosis not present

## 2018-03-05 NOTE — Patient Instructions (Signed)
-  It was nice seeing you today!  -Please continue to monitor your blood pressure at home at least 3 times a week and bring that log into next visit.  -Please schedule your annual wellness visit after March 2020.  Come in fasting at that time.

## 2018-03-05 NOTE — Progress Notes (Signed)
Established Patient Office Visit     CC/Reason for Visit: Blood pressure follow-up  HPI: Cassandra Mcfarland is a 68 y.o. female who is coming in today for the above mentioned reasons.  Due for annual physical in March 2020.  She has a history of hypertension but has not been on medications.  At her previous visit in November she was noticed to be quite hypertensive with a blood pressure of 180/100.  She was hesitant to start antihypertensive therapy she was asked to return today for follow-up.  She has not had headache, chest pain or any major issues.  She has been taking her blood pressures at home.  Systolics range between 654 and 650 with diastolics between 75 and 90.  Past Medical/Surgical History: Past Medical History:  Diagnosis Date  . Anemia    after knee surgery ( only time)  . Anxiety   . Complication of anesthesia   . Depression   . History of urinary tract infection   . Hypertension    no longer on medications  since knee surgery and pain resolved  . Osteoarthritis   . PONV (postoperative nausea and vomiting)   . Situational anxiety 09/2003  . Situational depression   . Thyroid disease    hyper parathyroidism    Past Surgical History:  Procedure Laterality Date  . abddominoplasty  1995  . APPENDECTOMY  Age 21  . BREAST REDUCTION SURGERY  09/2001   & Lift  . COLONOSCOPY    . EYE SURGERY     cataract bil  . ORIF ELBOW FRACTURE Left 03/15/2017   Procedure: OPEN REDUCTION INTERNAL FIXATION (ORIF) ELBOW/OLECRANON FRACTURE;  Surgeon: Altamese [place], MD;  Location: [facility];  Service: Orthopedics;  Laterality: Left;  . PARATHYROIDECTOMY  5/1/112   due to hypercalcemia  . SHOULDER SURGERY     left rotator cuff; bicep tendon repair on left   . THUMB FUSION  08/2007   with bone graft- post trauma Dr. Loney Loh  . THYROIDECTOMY N/A 10/23/2017   Procedure: TOTAL THYROIDECTOMY;  Surgeon: Armandina Gemma, MD;  Location: [facility];  Service: General;  Laterality: N/A;  . toatal knee  replacement Right 2001  . TOTAL KNEE ARTHROPLASTY Left 08/02/2015   Procedure: LEFT TOTAL KNEE ARTHROPLASTY;  Surgeon: Gaynelle Arabian, MD;  Location: [facility];  Service: Orthopedics;  Laterality: Left;  . TOTAL THYROIDECTOMY     [place]  . TUBAL LIGATION  1994    Social History:  reports that she has never smoked. She has never used smokeless tobacco. She reports current alcohol use. She reports that she does not use drugs.  Allergies: Allergies  Allergen Reactions  . Ciprofloxacin Dermatitis, Other (See Comments) and Rash    Severe burning, blisters on toes  . Tetanus-Diphtheria Toxoids Td Swelling    SWELLING REACTION UNSPECIFIED  [LOCAL ? Vs SYSTEMIC ?]  . Codeine Nausea And Vomiting  . Methimazole Itching and Rash    Blisters    Family History:  Family History  Problem Relation Age of Onset  . Coronary artery disease Father   . Heart disease Father   . Heart attack Father   . Coronary artery disease Brother   . Hypertension Brother   . Cancer Mother   . Leukemia Mother 16  . Osteoporosis Mother   . Hypertension Sister   . Osteoporosis Sister   . Hypertension Brother   . Osteoporosis Maternal Aunt      Current Outpatient Medications:  .  ALPRAZolam Duanne Moron)  0.5 MG tablet, Take 1 tablet (0.5 mg total) by mouth at bedtime as needed., Disp: 90 tablet, Rfl: 0 .  diazepam (VALIUM) 5 MG tablet, Take 1 tablet (5 mg total) by mouth every 12 (twelve) hours as needed for anxiety., Disp: 60 tablet, Rfl: 0 .  escitalopram (LEXAPRO) 5 MG tablet, TAKE 1 TABLET(5 MG) BY MOUTH DAILY, Disp: 90 tablet, Rfl: 0 .  Estradiol (VAGIFEM) 10 MCG TABS vaginal tablet, Place 10 mcg vaginally 2 (two) times a week. On Sunday and Thursday, Disp: , Rfl:  .  ezetimibe (ZETIA) 10 MG tablet, TAKE 1 TABLET(10 MG) BY MOUTH DAILY, Disp: 90 tablet, Rfl: 4 .  levothyroxine (SYNTHROID, LEVOTHROID) 100 MCG tablet, TK 1 T PO QD IN THE MORNING OES, Disp: , Rfl: 2  Current Facility-Administered Medications:  .   ipratropium-albuterol (DUONEB) 0.5-2.5 (3) MG/3ML nebulizer solution 3 mL, 3 mL, Nebulization, Once, Nafziger, Cory, NP  Review of Systems:  Constitutional: Denies fever, chills, diaphoresis, appetite change and fatigue.  HEENT: Denies photophobia, eye pain, redness, hearing loss, ear pain, congestion, sore throat, rhinorrhea, sneezing, mouth sores, trouble swallowing, neck pain, neck stiffness and tinnitus.   Respiratory: Denies SOB, DOE, cough, chest tightness,  and wheezing.   Cardiovascular: Denies chest pain, palpitations and leg swelling.  Gastrointestinal: Denies nausea, vomiting, abdominal pain, diarrhea, constipation, blood in stool and abdominal distention.  Genitourinary: Denies dysuria, urgency, frequency, hematuria, flank pain and difficulty urinating.  Endocrine: Denies: hot or cold intolerance, sweats, changes in hair or nails, polyuria, polydipsia. Musculoskeletal: Denies myalgias, back pain, joint swelling, arthralgias and gait problem.  Skin: Denies pallor, rash and wound.  Neurological: Denies dizziness, seizures, syncope, weakness, light-headedness, numbness and headaches.  Hematological: Denies adenopathy. Easy bruising, personal or family bleeding history  Psychiatric/Behavioral: Denies suicidal ideation, mood changes, confusion, nervousness, sleep disturbance and agitation    Physical Exam: Vitals:   03/05/18 1441  BP: 120/80  Pulse: 88  Temp: 98.1 F (36.7 C)  TempSrc: Oral  SpO2: 99%  Weight: 133 lb 12.8 oz (60.7 kg)    Body mass index is 22.27 kg/m.   Constitutional: NAD, calm, comfortable Eyes: PERRL, lids and conjunctivae normal ENMT: Mucous membranes are moist. Respiratory: clear to auscultation bilaterally, no wheezing, no crackles. Normal respiratory effort. No accessory muscle use.  Cardiovascular: Regular rate and rhythm, no murmurs / rubs / gallops. No extremity edema. 2+ pedal pulses. No carotid bruits.    Impression and Plan:  Essential  hypertension -Blood pressures improved today to 120/80. -However ambulatory blood pressure measurements are a little high, I have advised that we will continue to monitor these to determine if antihypertensive therapy is needed. -She will return in 3 to 4 months for her annual physical exam.  She is advised to continue to record ambulatory blood pressure and to bring in log to next visit.    Patient Instructions  -It was nice seeing you today!  -Please continue to monitor your blood pressure at home at least 3 times a week and bring that log into next visit.  -Please schedule your annual wellness visit after March 2020.  Come in fasting at that time.     Lelon Frohlich, MD [facility]

## 2018-03-06 DIAGNOSIS — M542 Cervicalgia: Secondary | ICD-10-CM | POA: Diagnosis not present

## 2018-03-06 DIAGNOSIS — M9901 Segmental and somatic dysfunction of cervical region: Secondary | ICD-10-CM | POA: Diagnosis not present

## 2018-03-06 DIAGNOSIS — M9903 Segmental and somatic dysfunction of lumbar region: Secondary | ICD-10-CM | POA: Diagnosis not present

## 2018-03-06 DIAGNOSIS — M9902 Segmental and somatic dysfunction of thoracic region: Secondary | ICD-10-CM | POA: Diagnosis not present

## 2018-03-20 ENCOUNTER — Other Ambulatory Visit: Payer: Self-pay | Admitting: *Deleted

## 2018-03-20 MED ORDER — ESCITALOPRAM OXALATE 5 MG PO TABS: ORAL_TABLET | ORAL | 0 refills | Status: DC

## 2018-05-15 DIAGNOSIS — E890 Postprocedural hypothyroidism: Secondary | ICD-10-CM | POA: Diagnosis not present

## 2018-05-22 DIAGNOSIS — C73 Malignant neoplasm of thyroid gland: Secondary | ICD-10-CM | POA: Diagnosis not present

## 2018-05-22 DIAGNOSIS — E890 Postprocedural hypothyroidism: Secondary | ICD-10-CM | POA: Diagnosis not present

## 2018-06-04 ENCOUNTER — Encounter: Payer: PPO | Admitting: Internal Medicine

## 2018-06-04 ENCOUNTER — Telehealth: Payer: Self-pay | Admitting: Internal Medicine

## 2018-06-04 NOTE — Telephone Encounter (Signed)
Copied from [place] [id number]. Topic: Quick Communication - Rx Refill/Question >> Jun 04, 2018  2:16 PM Alanda Slim E wrote: Medication: ALPRAZolam Duanne Moron) 0.5 MG tablet   Has the patient contacted their pharmacy? Yes- pharmacy stated they've sent request with no response   Preferred Pharmacy (with phone number or street name): [facility], [place] - [address] [place] [address] [phone] (Phone) [phone] (Fax)    Agent: Please be advised that RX refills may take up to 3 business days. We ask that you follow-up with your pharmacy.

## 2018-06-04 NOTE — Telephone Encounter (Signed)
Let's give her 1 more month, needs visit to refill further

## 2018-06-05 MED ORDER — ALPRAZOLAM 0.5 MG PO TABS: 0.5000 mg | ORAL_TABLET | Freq: Every evening | ORAL | 0 refills | Status: DC | PRN

## 2018-06-05 NOTE — Telephone Encounter (Signed)
Refill sent.

## 2018-06-25 ENCOUNTER — Emergency Department (HOSPITAL_COMMUNITY): Payer: PPO

## 2018-06-25 ENCOUNTER — Encounter (HOSPITAL_COMMUNITY): Payer: Self-pay | Admitting: Emergency Medicine

## 2018-06-25 ENCOUNTER — Other Ambulatory Visit: Payer: Self-pay

## 2018-06-25 ENCOUNTER — Emergency Department (HOSPITAL_COMMUNITY)
Admission: EM | Admit: 2018-06-25 | Discharge: 2018-06-25 | Disposition: A | Discharge status: Home / Self Care | Payer: PPO | Attending: Emergency Medicine | Admitting: Emergency Medicine

## 2018-06-25 DIAGNOSIS — S52591A Other fractures of lower end of right radius, initial encounter for closed fracture: Secondary | ICD-10-CM | POA: Diagnosis not present

## 2018-06-25 DIAGNOSIS — S3992XA Unspecified injury of lower back, initial encounter: Secondary | ICD-10-CM | POA: Diagnosis not present

## 2018-06-25 DIAGNOSIS — Y929 Unspecified place or not applicable: Secondary | ICD-10-CM | POA: Diagnosis not present

## 2018-06-25 DIAGNOSIS — E079 Disorder of thyroid, unspecified: Secondary | ICD-10-CM | POA: Diagnosis not present

## 2018-06-25 DIAGNOSIS — M545 Low back pain: Secondary | ICD-10-CM | POA: Diagnosis not present

## 2018-06-25 DIAGNOSIS — Z79899 Other long term (current) drug therapy: Secondary | ICD-10-CM | POA: Insufficient documentation

## 2018-06-25 DIAGNOSIS — I10 Essential (primary) hypertension: Secondary | ICD-10-CM | POA: Insufficient documentation

## 2018-06-25 DIAGNOSIS — W19XXXA Unspecified fall, initial encounter: Secondary | ICD-10-CM

## 2018-06-25 DIAGNOSIS — Y939 Activity, unspecified: Secondary | ICD-10-CM | POA: Diagnosis not present

## 2018-06-25 DIAGNOSIS — S6991XA Unspecified injury of right wrist, hand and finger(s), initial encounter: Secondary | ICD-10-CM | POA: Diagnosis present

## 2018-06-25 DIAGNOSIS — W1830XA Fall on same level, unspecified, initial encounter: Secondary | ICD-10-CM | POA: Diagnosis not present

## 2018-06-25 DIAGNOSIS — S52501A Unspecified fracture of the lower end of right radius, initial encounter for closed fracture: Secondary | ICD-10-CM | POA: Insufficient documentation

## 2018-06-25 DIAGNOSIS — Y999 Unspecified external cause status: Secondary | ICD-10-CM | POA: Insufficient documentation

## 2018-06-25 MED ORDER — OXYCODONE-ACETAMINOPHEN 5-325 MG PO TABS: 1.0000 | ORAL_TABLET | Freq: Four times a day (QID) | ORAL | 0 refills | Status: DC | PRN

## 2018-06-25 MED ORDER — ONDANSETRON 4 MG PO TBDP
4.0000 mg | ORAL_TABLET | Freq: Once | ORAL | Status: AC
  Administered 2018-06-25: 4 mg via ORAL
  Filled 2018-06-25: qty 1

## 2018-06-25 MED ORDER — OXYCODONE-ACETAMINOPHEN 5-325 MG PO TABS
1.0000 | ORAL_TABLET | Freq: Once | ORAL | Status: AC
  Administered 2018-06-25: 1 via ORAL
  Filled 2018-06-25: qty 1

## 2018-06-25 NOTE — ED Provider Notes (Signed)
[facility] EMERGENCY DEPARTMENT Provider Note   CSN: [id number] Arrival date & time: 06/25/18  1923    History   Chief Complaint Chief Complaint  Patient presents with  . Wrist Injury    HPI Cassandra Mcfarland is a 68 y.o. female with a hx of anemia, HTN, anxiety, and depression presents to the ED s/p mechanical fall shortly PTA with complaints of R wrist pain. Patient states she tripped over her dog, & fell backwards onto an outstretched RUE. She states she gently bumped her head, no LOC. Was able to get up and has been ambulatory since. Notes primary pain w/ swelling to the R wrist, also mentions some mild soreness to the lower back. R wrist pain is a 9/10 in severity, worse with movement, no alleviating factors. Patient is R hand dominant. Denies headache, numbness, weakness, dizziness, headache, change in vision, vomiting, or seizure activity. No prodromal sxs prior to fall. Not on anticoagulation.     HPI  Past Medical History:  Diagnosis Date  . Anemia    after knee surgery ( only time)  . Anxiety   . Complication of anesthesia   . Depression   . History of urinary tract infection   . Hypertension    no longer on medications  since knee surgery and pain resolved  . Osteoarthritis   . PONV (postoperative nausea and vomiting)   . Situational anxiety 09/2003  . Situational depression   . Thyroid disease    hyper parathyroidism    Patient Active Problem List   Diagnosis Date Noted  . Iatrogenic hypothyroidism 01/16/2018  . Hyperthyroidism 10/21/2017  . Multiple thyroid nodules 10/21/2017  . Low serum thyroid stimulating hormone (TSH) 07/25/2017  . Depression 02/16/2016  . Iron deficiency anemia 09/21/2015  . OA (osteoarthritis) of knee 08/02/2015  . Dyslipidemia 01/03/2012  . Hypertension 01/03/2012  . HYPERCALCEMIA 10/29/2007  . MENOPAUSAL SYNDROME 10/29/2007  . Osteoarthritis 10/29/2007    Past Surgical History:  Procedure Laterality Date  .  abddominoplasty  1995  . APPENDECTOMY  Age 24  . BREAST REDUCTION SURGERY  09/2001   & Lift  . COLONOSCOPY    . EYE SURGERY     cataract bil  . ORIF ELBOW FRACTURE Left 03/15/2017   Procedure: OPEN REDUCTION INTERNAL FIXATION (ORIF) ELBOW/OLECRANON FRACTURE;  Surgeon: Altamese Taunton, MD;  Location: [facility];  Service: Orthopedics;  Laterality: Left;  . PARATHYROIDECTOMY  5/1/112   due to hypercalcemia  . SHOULDER SURGERY     left rotator cuff; bicep tendon repair on left   . THUMB FUSION  08/2007   with bone graft- post trauma Dr. Loney Loh  . THYROIDECTOMY N/A 10/23/2017   Procedure: TOTAL THYROIDECTOMY;  Surgeon: Armandina Gemma, MD;  Location: [facility];  Service: General;  Laterality: N/A;  . toatal knee replacement Right 2001  . TOTAL KNEE ARTHROPLASTY Left 08/02/2015   Procedure: LEFT TOTAL KNEE ARTHROPLASTY;  Surgeon: Gaynelle Arabian, MD;  Location: [facility];  Service: Orthopedics;  Laterality: Left;  . TOTAL THYROIDECTOMY     [place]  . TUBAL LIGATION  1994     OB History    Gravida  2   Para  2   Term  2   Preterm      AB      Living  2     SAB      TAB      Ectopic      Multiple      Live Births  1            Home Medications    Prior to Admission medications   Medication Sig Start Date End Date Taking? Authorizing Provider  ALPRAZolam Duanne Moron) 0.5 MG tablet Take 1 tablet (0.5 mg total) by mouth at bedtime as needed. Office visit for further refills 06/05/2018 06/05/18   Isaac Bliss, Rayford Halsted, MD  diazepam (VALIUM) 5 MG tablet Take 1 tablet (5 mg total) by mouth every 12 (twelve) hours as needed for anxiety. 05/14/17   Marletta Lor, MD  escitalopram (LEXAPRO) 5 MG tablet TAKE 1 TABLET(5 MG) BY MOUTH DAILY 03/20/18   Isaac Bliss, Rayford Halsted, MD  Estradiol (VAGIFEM) 10 MCG TABS vaginal tablet Place 10 mcg vaginally 2 (two) times a week. On Sunday and Thursday    [provider]  ezetimibe (ZETIA) 10 MG tablet TAKE 1 TABLET(10 MG) BY MOUTH DAILY  05/14/17   Marletta Lor, MD  levothyroxine (SYNTHROID, LEVOTHROID) 100 MCG tablet TK 1 T PO QD IN THE MORNING OES 01/08/18   [provider]    Family History Family History  Problem Relation Age of Onset  . Coronary artery disease Father   . Heart disease Father   . Heart attack Father   . Coronary artery disease Brother   . Hypertension Brother   . Cancer Mother   . Leukemia Mother 38  . Osteoporosis Mother   . Hypertension Sister   . Osteoporosis Sister   . Hypertension Brother   . Osteoporosis Maternal Aunt     Social History Social History   Tobacco Use  . Smoking status: Never Smoker  . Smokeless tobacco: Never Used  Substance Use Topics  . Alcohol use: Yes    Comment: occas glass of wine   . Drug use: No     Allergies   Ciprofloxacin; Tetanus-diphtheria toxoids td; Codeine; and Methimazole   Review of Systems Review of Systems  Constitutional: Negative for chills and fever.  Respiratory: Negative for shortness of breath.   Cardiovascular: Negative for chest pain.  Gastrointestinal: Negative for abdominal pain and vomiting.  Musculoskeletal: Positive for arthralgias, back pain and joint swelling. Negative for neck pain.  Neurological: Negative for dizziness, seizures, syncope, speech difficulty, weakness, light-headedness, numbness and headaches.  All other systems reviewed and are negative.    Physical Exam Updated Vital Signs BP (!) 173/90 (BP Location: Left Arm)   Pulse 70   Temp 97.8 F (36.6 C) (Oral)   Resp 18   Ht 5\' 5"  (1.651 m)   Wt 60.8 kg   LMP 11/10/2014 (Exact Date)   SpO2 100%   BMI 22.30 kg/m   Physical Exam Vitals signs and nursing note reviewed.  Constitutional:      General: She is not in acute distress.    Appearance: She is well-developed.  HENT:     Head: Normocephalic and atraumatic. No raccoon eyes or Battle's sign.     Right Ear: No hemotympanum.     Left Ear: No hemotympanum.  Eyes:     General:         Right eye: No discharge.        Left eye: No discharge.     Conjunctiva/sclera: Conjunctivae normal.     Pupils: Pupils are equal, round, and reactive to light.  Neck:     Musculoskeletal: Normal range of motion. No spinous process tenderness.     Comments: No midline cervical spine tenderness to palpation.  No palpable step-off. Cardiovascular:  Rate and Rhythm: Normal rate and regular rhythm.     Heart sounds: No murmur.     Comments: 2+ symmetric radial pulses. Pulmonary:     Effort: No respiratory distress.     Breath sounds: Normal breath sounds. No wheezing or rales.  Chest:     Chest wall: No tenderness.  Abdominal:     General: There is no distension.     Palpations: Abdomen is soft.     Tenderness: There is no abdominal tenderness.  Musculoskeletal:     Comments: Upper extremities: Patient has soft tissue swelling noted to the radial aspect of the right wrist.  Mild bruising noted.  No open wounds.  Patient has intact active range of motion throughout the upper extremities with the exception of limitation in flexion and extension of the right wrist as well as full extension of the right thumb.  Patient is tender to palpation over the distal right radius.  Upper extremities are otherwise nontender.  No anatomical snuffbox tenderness. Back: Patient has some mild diffuse tenderness to the midline lumbar as well as sacral region.  No point/focal bony tenderness.  No palpable step-off.  No tenderness to the thoracic spine.  Skin:    General: Skin is warm and dry.     Findings: No rash.  Neurological:     Comments: Alert.  Clear speech.  CN III through XII grossly intact.  Difficult to fully assess grip strength in the right hand secondary to pain, but able to squeeze my hand some.  Sensation grossly intact bilateral upper and lower extremities.  5 out of 5 strength of plantar dorsiflexion bilaterally.  Ambulatory.  Psychiatric:        Behavior: Behavior normal.    ED  Treatments / Results  Labs (all labs ordered are listed, but only abnormal results are displayed) Labs Reviewed - No data to display  EKG None  Radiology Dg Lumbar Spine Complete  Result Date: 06/25/2018 CLINICAL DATA:  Fall EXAM: LUMBAR SPINE - COMPLETE 4+ VIEW COMPARISON:  None. FINDINGS: There is no evidence of lumbar spine fracture. Alignment is normal. Intervertebral disc spaces are maintained. Mild facet degeneration L4-5 and L5-S1 bilaterally. IMPRESSION: Negative for fracture Electronically Signed   By: Franchot Gallo M.D.   On: 06/25/2018 21:23   Dg Sacrum/coccyx  Result Date: 06/25/2018 CLINICAL DATA:  Fall EXAM: SACRUM AND COCCYX - 2+ VIEW COMPARISON:  None. FINDINGS: There is no evidence of fracture or other focal bone lesions. IMPRESSION: Negative. Electronically Signed   By: Franchot Gallo M.D.   On: 06/25/2018 21:24   Dg Wrist 2 Views Right  Result Date: 06/25/2018 CLINICAL DATA:  Fall EXAM: RIGHT WRIST - 2 VIEW COMPARISON:  None. FINDINGS: Nondisplaced transverse fracture distal radius does not appear to involve the joint. Ventral soft tissue swelling. No fracture of the ulna. No fracture of the carpal bones. Moderate degenerative change of the base of thumb. IMPRESSION: Nondisplaced transverse fracture distal radius Electronically Signed   By: Franchot Gallo M.D.   On: 06/25/2018 21:25   Dg Hand 2 View Right  Result Date: 06/25/2018 CLINICAL DATA:  Fall EXAM: RIGHT HAND - 2 VIEW COMPARISON:  None. FINDINGS: Transverse impacted fracture distal radius without significant displacement. No fracture of the ulna. Negative for hand fracture. Mild degenerative change in the second and third DIP joints. Degenerative change base of thumb. IMPRESSION: Transverse nondisplaced fracture distal radius Electronically Signed   By: Franchot Gallo M.D.   On: 06/25/2018 21:22  Procedures Procedures (including critical care time)  SPLINT APPLICATION Date/Time: 6:60 PM Authorized by:  Kennith Maes Consent: Verbal consent obtained. Risks and benefits: risks, benefits and alternatives were discussed Consent given by: patient Splint applied by: orthopedic technician Location details: RUE Splint type: volar  Post-procedure: The splinted body part was neurovascularly unchanged following the procedure. Patient tolerance: Patient tolerated the procedure well with no immediate complications.  Medications Ordered in ED Medications  oxyCODONE-acetaminophen (PERCOCET/ROXICET) 5-325 MG per tablet 1 tablet (1 tablet Oral Given 06/25/18 2007)  ondansetron (ZOFRAN-ODT) disintegrating tablet 4 mg (4 mg Oral Given 06/25/18 2007)     Initial Impression / Assessment and Plan / ED Course  I have reviewed the triage vital signs and the nursing notes.  Pertinent labs & imaging results that were available during my care of the patient were reviewed by me and considered in my medical decision making (see chart for details).   Patient presents to the emergency department status post mechanical fall with primary complaint of right wrist pain/swelling.  No prodromal symptoms.  Patient nontoxic-appearing, vitals WNL with the exception of elevated blood pressure, may be component of pain, doubt HTN emergency, improved in ER, PCP recheck.  Patient does not appear to have serious head, neck, back, or anterior thoracic/abdominal injury.  Do not feel CT head/C-spine are necessary per [place] rules.  Patient noted to have some diffuse tenderness to the lumbar and sacral region, x-rays negative for fracture or dislocation. No point/focal vertebral tenderness, no neuro deficits.  No chest or abdominal tenderness.  X-rays of the right wrist/hand notable for transverse nondisplaced distal radius fracture.  Neurovascularly intact distally.  Will place in a volar splint. PRICE. Percocet for severe pain. Hand surgery follow up. I discussed results, treatment plan, need for follow-up, and return precautions  with the patient. Provided opportunity for questions, patient confirmed understanding and is in agreement with plan.   Findings and plan of care discussed with supervising physician Dr. Tomi Bamberger who is in agreement.   Final Clinical Impressions(s) / ED Diagnoses   Final diagnoses:  Fall, initial encounter  Closed fracture of distal end of right radius, unspecified fracture morphology, initial encounter    ED Discharge Orders         Ordered    oxyCODONE-acetaminophen (PERCOCET/ROXICET) 5-325 MG tablet  Every 6 hours PRN     06/25/18 2158           Amaryllis Dyke, PA-C 06/25/18 2311    Dorie Rank, MD 07/01/18 [phone]

## 2018-06-25 NOTE — Discharge Instructions (Signed)
Please read and follow all provided instructions.  You have been seen today after a fall.  Your xrays showed that you have a distal right radius fracture that is nondisplaced.  We have placed you in a splint, please keep this clean and dry and in place until you have followed up with hand surgery.  Please follow up within 1 week.   Home care instructions: -- *PRICE in the first 24-48 hours after injury: Protect with splint Rest Ice- Do not apply ice pack directly to your splint, place towel or similar between your splint and ice/ice pack. Apply ice for 20 min, then remove for 40 min while awake Compression- Wear  spint Elevate affected extremity above the level of your heart when not walking around for the first 24-48 hours   Medications:  -Percocet-this is a narcotic/controlled substance medication that has potential addicting qualities.  We recommend that you take 1-2 tablets every 6 hours as needed for severe pain.  Do not drive or operate heavy machinery when taking this medicine as it can be sedating. Do not drink alcohol or take other sedating medications when taking this medicine for safety reasons.  Keep this out of reach of small children.  Please be aware this medicine has Tylenol in it (325 mg/tab) do not exceed the maximum dose of Tylenol in a day per over the counter recommendations should you decide to supplement with Tylenol over the counter.   We have prescribed you new medication(s) today. Discuss the medications prescribed today with your pharmacist as they can have adverse effects and interactions with your other medicines including over the counter and prescribed medications. Seek medical evaluation if you start to experience new or abnormal symptoms after taking one of these medicines, seek care immediately if you start to experience difficulty breathing, feeling of your throat closing, facial swelling, or rash as these could be indications of a more serious allergic  reaction  Follow-up instructions: Please follow-up with your Dr. Amedeo Plenty within 1 week for re-evaluation and further management.   Return instructions:  Please return if your digits or extremity are numb or tingling, appear gray or blue, or you have severe pain  Please return if you have redness or fevers.  Please return to the Emergency Department if you experience worsening symptoms.  Please return if you have any other emergent concerns. Additional Information:  Your vital signs today were: BP (!) 173/90 (BP Location: Left Arm)    Pulse 70    Temp 97.8 F (36.6 C) (Oral)    Resp 18    Ht 5\' 5"  (1.651 m)    Wt 60.8 kg    LMP 11/10/2014 (Exact Date)    SpO2 100%    BMI 22.30 kg/m  If your blood pressure (BP) was elevated above 135/85 this visit, please have this repeated by your doctor within one month. ---------------

## 2018-06-25 NOTE — ED Triage Notes (Signed)
Pt reports being tripped by her dog, falling, and possibly breaking her right wrist. Same is deformed.

## 2018-06-26 ENCOUNTER — Telehealth: Payer: Self-pay | Admitting: Internal Medicine

## 2018-06-26 NOTE — Telephone Encounter (Unsigned)
Copied from [place] [id number]. Topic: Quick Communication - Rx Refill/Question >> Jun 26, 2018  4:22 PM Celene Kras A wrote: Medication: escitalopram (LEXAPRO) 5 MG tablet  Has the patient contacted their pharmacy? Yes.   (Agent: If no, request that the patient contact the pharmacy for the refill.) (Agent: If yes, when and what did the pharmacy advise?)  Preferred Pharmacy (with phone number or street name): [id number] - [place], [place] - [address] [place] [address] [place] [address] [place] [address] Phone: [phone] Fax: [phone] Not a 24 hour pharmacy; exact hours not known.    Agent: Please be advised that RX refills may take up to 3 business days. We ask that you follow-up with your pharmacy.

## 2018-06-27 ENCOUNTER — Telehealth: Payer: Self-pay | Admitting: *Deleted

## 2018-06-27 NOTE — Telephone Encounter (Signed)
Appointment scheduled 4/31/220

## 2018-06-27 NOTE — Telephone Encounter (Signed)
Copied from [place] [phone]. Topic: Quick Communication - Rx Refill/Question >> Jun 26, 2018  4:22 PM Celene Kras A wrote: Medication: escitalopram (LEXAPRO) 5 MG tablet  Has the patient contacted their pharmacy? Yes.   (Agent: If no, request that the patient contact the pharmacy for the refill.) (Agent: If yes, when and what did the pharmacy advise?)  Preferred Pharmacy (with phone number or street name): [id number] - [place], [place] - [address] [place] [address] [place] [address] [place] [address] Phone: [phone] Fax: [phone] Not a 24 hour pharmacy; exact hours not known.    Agent: Please be advised that RX refills may take up to 3 business days. We ask that you follow-up with your pharmacy.

## 2018-06-28 ENCOUNTER — Ambulatory Visit (INDEPENDENT_AMBULATORY_CARE_PROVIDER_SITE_OTHER): Payer: PPO | Admitting: Internal Medicine

## 2018-06-28 ENCOUNTER — Other Ambulatory Visit: Payer: Self-pay

## 2018-06-28 DIAGNOSIS — F320 Major depressive disorder, single episode, mild: Secondary | ICD-10-CM

## 2018-06-28 MED ORDER — ESCITALOPRAM OXALATE 5 MG PO TABS: ORAL_TABLET | ORAL | 0 refills | Status: DC

## 2018-06-28 NOTE — Progress Notes (Signed)
Virtual Visit via Telephone Note  I connected with Cassandra Mcfarland on 06/28/18 at  8:30 AM EDT by telephone and verified that I am speaking with the correct person using two identifiers.   I discussed the limitations, risks, security and privacy concerns of performing an evaluation and management service by telephone and the availability of in person appointments. I also discussed with the patient that there may be a patient responsible charge related to this service. The patient expressed understanding and agreed to proceed.  Location patient: home Location provider: work office Participants present for the call: patient, provider Patient did not have a visit in the prior 7 days to address this/these issue(s).   History of Present Illness:  This visit is for medication refills only. She needs her lexapro refilled. She has been on this long-term, it really helps her mood. She feel 3 days ago and suffered a right wrist fracture; is having it repaired by Dr. Amedeo Plenty next week.   Observations/Objective: Patient sounds cheerful and well on the phone. I do not appreciate any increased work of breathing. Speech and thought processing are grossly intact. Patient reported vitals: BP 110/60   Current Outpatient Medications:  .  ALPRAZolam (XANAX) 0.5 MG tablet, Take 1 tablet (0.5 mg total) by mouth at bedtime as needed. Office visit for further refills 06/05/2018, Disp: 90 tablet, Rfl: 0 .  diazepam (VALIUM) 5 MG tablet, Take 1 tablet (5 mg total) by mouth every 12 (twelve) hours as needed for anxiety., Disp: 60 tablet, Rfl: 0 .  escitalopram (LEXAPRO) 5 MG tablet, TAKE 1 TABLET(5 MG) BY MOUTH DAILY, Disp: 90 tablet, Rfl: 0 .  Estradiol (VAGIFEM) 10 MCG TABS vaginal tablet, Place 10 mcg vaginally 2 (two) times a week. On Sunday and Thursday, Disp: , Rfl:  .  ezetimibe (ZETIA) 10 MG tablet, TAKE 1 TABLET(10 MG) BY MOUTH DAILY, Disp: 90 tablet, Rfl: 4 .  levothyroxine (SYNTHROID, LEVOTHROID) 100  MCG tablet, TK 1 T PO QD IN THE MORNING OES, Disp: , Rfl: 2 .  oxyCODONE-acetaminophen (PERCOCET/ROXICET) 5-325 MG tablet, Take 1-2 tablets by mouth every 6 (six) hours as needed for severe pain., Disp: 10 tablet, Rfl: 0  Current Facility-Administered Medications:  .  ipratropium-albuterol (DUONEB) 0.5-2.5 (3) MG/3ML nebulizer solution 3 mL, 3 mL, Nebulization, Once, Nafziger, Cory, NP  Review of Systems:  Constitutional: Denies fever, chills, diaphoresis, appetite change and fatigue.  HEENT: Denies photophobia, eye pain, redness, hearing loss, ear pain, congestion, sore throat, rhinorrhea, sneezing, mouth sores, trouble swallowing, neck pain, neck stiffness and tinnitus.   Respiratory: Denies SOB, DOE, cough, chest tightness,  and wheezing.   Cardiovascular: Denies chest pain, palpitations and leg swelling.  Gastrointestinal: Denies nausea, vomiting, abdominal pain, diarrhea, constipation, blood in stool and abdominal distention.  Genitourinary: Denies dysuria, urgency, frequency, hematuria, flank pain and difficulty urinating.  Endocrine: Denies: hot or cold intolerance, sweats, changes in hair or nails, polyuria, polydipsia. Musculoskeletal: Denies myalgias, back pain, joint swelling, arthralgias and gait problem.  Skin: Denies pallor, rash and wound.  Neurological: Denies dizziness, seizures, syncope, weakness, light-headedness, numbness and headaches.  Hematological: Denies adenopathy. Easy bruising, personal or family bleeding history  Psychiatric/Behavioral: Denies suicidal ideation, mood changes, confusion, nervousness, sleep disturbance and agitation   Assessment and Plan:  Current mild episode of major depressive disorder without prior episode ([place]) - Plan: escitalopram (LEXAPRO) 5 MG tablet  -Mood is stable. -Refill lexapro.  I discussed the assessment and treatment plan with the patient. The patient  was provided an opportunity to ask questions and all were answered. The  patient agreed with the plan and demonstrated an understanding of the instructions.   The patient was advised to call back or seek an in-person evaluation if the symptoms worsen or if the condition fails to improve as anticipated.  I provided 12 minutes of non-face-to-face time during this encounter.   Lelon Frohlich, MD [facility]

## 2018-07-02 DIAGNOSIS — M25531 Pain in right wrist: Secondary | ICD-10-CM | POA: Insufficient documentation

## 2018-07-03 DIAGNOSIS — S52501A Unspecified fracture of the lower end of right radius, initial encounter for closed fracture: Secondary | ICD-10-CM | POA: Diagnosis not present

## 2018-07-03 DIAGNOSIS — M25531 Pain in right wrist: Secondary | ICD-10-CM | POA: Diagnosis not present

## 2018-07-17 DIAGNOSIS — M25531 Pain in right wrist: Secondary | ICD-10-CM | POA: Diagnosis not present

## 2018-07-17 DIAGNOSIS — S52501D Unspecified fracture of the lower end of right radius, subsequent encounter for closed fracture with routine healing: Secondary | ICD-10-CM | POA: Diagnosis not present

## 2018-07-24 DIAGNOSIS — C73 Malignant neoplasm of thyroid gland: Secondary | ICD-10-CM | POA: Diagnosis not present

## 2018-07-24 DIAGNOSIS — E890 Postprocedural hypothyroidism: Secondary | ICD-10-CM | POA: Diagnosis not present

## 2018-08-07 DIAGNOSIS — S52501D Unspecified fracture of the lower end of right radius, subsequent encounter for closed fracture with routine healing: Secondary | ICD-10-CM | POA: Diagnosis not present

## 2018-08-07 DIAGNOSIS — M25531 Pain in right wrist: Secondary | ICD-10-CM | POA: Diagnosis not present

## 2018-08-21 DIAGNOSIS — S52501S Unspecified fracture of the lower end of right radius, sequela: Secondary | ICD-10-CM | POA: Diagnosis not present

## 2018-08-21 LAB — HM DEXA SCAN

## 2018-09-10 ENCOUNTER — Other Ambulatory Visit: Payer: Self-pay

## 2018-09-10 ENCOUNTER — Ambulatory Visit (INDEPENDENT_AMBULATORY_CARE_PROVIDER_SITE_OTHER): Payer: PPO | Admitting: Internal Medicine

## 2018-09-10 ENCOUNTER — Encounter: Payer: Self-pay | Admitting: Internal Medicine

## 2018-09-10 VITALS — BP 160/90 | HR 70 | Temp 97.1°F | Ht 64.25 in | Wt 131.1 lb

## 2018-09-10 DIAGNOSIS — Z23 Encounter for immunization: Secondary | ICD-10-CM

## 2018-09-10 DIAGNOSIS — E785 Hyperlipidemia, unspecified: Secondary | ICD-10-CM

## 2018-09-10 DIAGNOSIS — F419 Anxiety disorder, unspecified: Secondary | ICD-10-CM | POA: Diagnosis not present

## 2018-09-10 DIAGNOSIS — I10 Essential (primary) hypertension: Secondary | ICD-10-CM

## 2018-09-10 DIAGNOSIS — F320 Major depressive disorder, single episode, mild: Secondary | ICD-10-CM | POA: Diagnosis not present

## 2018-09-10 DIAGNOSIS — E032 Hypothyroidism due to medicaments and other exogenous substances: Secondary | ICD-10-CM

## 2018-09-10 DIAGNOSIS — Z0000 Encounter for general adult medical examination without abnormal findings: Secondary | ICD-10-CM

## 2018-09-10 LAB — COMPREHENSIVE METABOLIC PANEL
ALT: 12 U/L (ref 0–35)
AST: 19 U/L (ref 0–37)
Albumin: 4.5 g/dL (ref 3.5–5.2)
Alkaline Phosphatase: 83 U/L (ref 39–117)
BUN: 13 mg/dL (ref 6–23)
CO2: 26 mEq/L (ref 19–32)
Calcium: 9 mg/dL (ref 8.4–10.5)
Chloride: 104 mEq/L (ref 96–112)
Creatinine, Ser: 0.76 mg/dL (ref 0.40–1.20)
GFR: 75.92 mL/min (ref >60.00)
Glucose, Bld: 77 mg/dL (ref 70–99)
Potassium: 4.1 mEq/L (ref 3.5–5.1)
Sodium: 139 mEq/L (ref 135–145)
Total Bilirubin: 0.7 mg/dL (ref 0.2–1.2)
Total Protein: 6.5 g/dL (ref 6.0–8.3)

## 2018-09-10 LAB — CBC WITH DIFFERENTIAL/PLATELET
Basophils Absolute: 0 10*3/uL (ref 0.0–0.1)
Basophils Relative: 1.1 % (ref 0.0–3.0)
Eosinophils Absolute: 0.1 10*3/uL (ref 0.0–0.7)
Eosinophils Relative: 1.8 % (ref 0.0–5.0)
HCT: 40.8 % (ref 36.0–46.0)
Hemoglobin: 13.5 g/dL (ref 12.0–15.0)
Lymphocytes Relative: 33.6 % (ref 12.0–46.0)
Lymphs Abs: 1.5 10*3/uL (ref 0.7–4.0)
MCHC: 33 g/dL (ref 30.0–36.0)
MCV: 90.8 fl (ref 78.0–100.0)
Monocytes Absolute: 0.3 10*3/uL (ref 0.1–1.0)
Monocytes Relative: 6.8 % (ref 3.0–12.0)
Neutro Abs: 2.5 10*3/uL (ref 1.4–7.7)
Neutrophils Relative %: 56.7 % (ref 43.0–77.0)
Platelets: 186 10*3/uL (ref 150.0–400.0)
RBC: 4.5 Mil/uL (ref 3.87–5.11)
RDW: 13.3 % (ref 11.5–15.5)
WBC: 4.4 10*3/uL (ref 4.0–10.5)

## 2018-09-10 LAB — TSH: TSH: 0.11 u[IU]/mL — ABNORMAL LOW (ref 0.35–4.50)

## 2018-09-10 LAB — LIPID PANEL
Cholesterol: 179 mg/dL (ref 0–200)
HDL: 58.8 mg/dL (ref >39.00)
LDL Cholesterol: 100 mg/dL — ABNORMAL HIGH (ref 0–99)
NonHDL: 119.96
Total CHOL/HDL Ratio: 3
Triglycerides: 99 mg/dL (ref 0.0–149.0)
VLDL: 19.8 mg/dL (ref 0.0–40.0)

## 2018-09-10 LAB — VITAMIN D 25 HYDROXY (VIT D DEFICIENCY, FRACTURES): VITD: 34.24 ng/mL (ref 30.00–100.00)

## 2018-09-10 LAB — VITAMIN B12: Vitamin B-12: 286 pg/mL (ref 211–911)

## 2018-09-10 LAB — HEMOGLOBIN A1C: Hgb A1c MFr Bld: 5.6 % (ref 4.6–6.5)

## 2018-09-10 MED ORDER — ALPRAZOLAM 0.5 MG PO TABS: 0.5000 mg | ORAL_TABLET | Freq: Every evening | ORAL | 0 refills | Status: DC | PRN

## 2018-09-10 NOTE — Patient Instructions (Addendum)
-Nice seeing you today!!  -Lab work today; will notify you once results are available.  -Keep a watch on your blood pressure. I want to know if they are above 130/80 consistently.  -Schedule follow up in 4 months.   Preventive Care 14 Years and Older, Female Preventive care refers to lifestyle choices and visits with your health care provider that can promote health and wellness. This includes:  A yearly physical exam. This is also called an annual well check.  Regular dental and eye exams.  Immunizations.  Screening for certain conditions.  Healthy lifestyle choices, such as diet and exercise. What can I expect for my preventive care visit? Physical exam Your health care provider will check:  Height and weight. These may be used to calculate body mass index (BMI), which is a measurement that tells if you are at a healthy weight.  Heart rate and blood pressure.  Your skin for abnormal spots. Counseling Your health care provider may ask you questions about:  Alcohol, tobacco, and drug use.  Emotional well-being.  Home and relationship well-being.  Sexual activity.  Eating habits.  History of falls.  Memory and ability to understand (cognition).  Work and work Statistician.  Pregnancy and menstrual history. What immunizations do I need?  Influenza (flu) vaccine  This is recommended every year. Tetanus, diphtheria, and pertussis (Tdap) vaccine  You may need a Td booster every 10 years. Varicella (chickenpox) vaccine  You may need this vaccine if you have not already been vaccinated. Zoster (shingles) vaccine  You may need this after age 26. Pneumococcal conjugate (PCV13) vaccine  One dose is recommended after age 32. Pneumococcal polysaccharide (PPSV23) vaccine  One dose is recommended after age 56. Measles, mumps, and rubella (MMR) vaccine  You may need at least one dose of MMR if you were born in 1957 or later. You may also need a second dose.  Meningococcal conjugate (MenACWY) vaccine  You may need this if you have certain conditions. Hepatitis A vaccine  You may need this if you have certain conditions or if you travel or work in places where you may be exposed to hepatitis A. Hepatitis B vaccine  You may need this if you have certain conditions or if you travel or work in places where you may be exposed to hepatitis B. Haemophilus influenzae type b (Hib) vaccine  You may need this if you have certain conditions. You may receive vaccines as individual doses or as more than one vaccine together in one shot (combination vaccines). Talk with your health care provider about the risks and benefits of combination vaccines. What tests do I need? Blood tests  Lipid and cholesterol levels. These may be checked every 5 years, or more frequently depending on your overall health.  Hepatitis C test.  Hepatitis B test. Screening  Lung cancer screening. You may have this screening every year starting at age 11 if you have a 30-pack-year history of smoking and currently smoke or have quit within the past 15 years.  Colorectal cancer screening. All adults should have this screening starting at age 44 and continuing until age 53. Your health care provider may recommend screening at age 50 if you are at increased risk. You will have tests every 1-10 years, depending on your results and the type of screening test.  Diabetes screening. This is done by checking your blood sugar (glucose) after you have not eaten for a while (fasting). You may have this done every 1-3 years.  Mammogram.  This may be done every 1-2 years. Talk with your health care provider about how often you should have regular mammograms.  BRCA-related cancer screening. This may be done if you have a family history of breast, ovarian, tubal, or peritoneal cancers. Other tests  Sexually transmitted disease (STD) testing.  Bone density scan. This is done to screen for  osteoporosis. You may have this done starting at age 77. Follow these instructions at home: Eating and drinking  Eat a diet that includes fresh fruits and vegetables, whole grains, lean protein, and low-fat dairy products. Limit your intake of foods with high amounts of sugar, saturated fats, and salt.  Take vitamin and mineral supplements as recommended by your health care provider.  Do not drink alcohol if your health care provider tells you not to drink.  If you drink alcohol: ? Limit how much you have to 0-1 drink a day. ? Be aware of how much alcohol is in your drink. In the [place] one drink equals one 12 oz bottle of beer (355 mL), one 5 oz glass of wine (148 mL), or one 1 oz glass of hard liquor (44 mL). Lifestyle  Take daily care of your teeth and gums.  Stay active. Exercise for at least 30 minutes on 5 or more days each week.  Do not use any products that contain nicotine or tobacco, such as cigarettes, e-cigarettes, and chewing tobacco. If you need help quitting, ask your health care provider.  If you are sexually active, practice safe sex. Use a condom or other form of protection in order to prevent STIs (sexually transmitted infections).  Talk with your health care provider about taking a low-dose aspirin or statin. What's next?  Go to your health care provider once a year for a well check visit.  Ask your health care provider how often you should have your eyes and teeth checked.  Stay up to date on all vaccines. This information is not intended to replace advice given to you by your health care provider. Make sure you discuss any questions you have with your health care provider. Document Released: 03/12/2015 Document Revised: 02/07/2018 Document Reviewed: 02/07/2018 Elsevier Patient Education  2020 [employer].

## 2018-09-10 NOTE — Progress Notes (Signed)
Established Patient Office Visit     CC/Reason for Visit: Annual preventive exam and subsequent Medicare wellness visit  HPI: Cassandra Mcfarland is a 68 y.o. female who is coming in today for the above mentioned reasons. Past Medical History is significant for: Multinodular goiter with clinical hyperthyroidism who recently underwent total thyroidectomy in August 2019.  Now hypothyroid on Synthroid.  Followed by Dr. Chalmers Cater with endocrinology.  Also history of hypertension (taken off blood pressure medication 2 years ago), hyperlipidemia.   She takes alprazolam 0.5 mg at bedtime for insomnia.  Has also been taking diazepam 2-3 times a month for anxiety symptoms.  She is asking for refills of both today.  She has routine eye and dental care.  She is due for mammogram but states she usually has this done with her GYN and has an appointment scheduled with them in August.  Her levothyroxine dose was decreased from 100 to 88 mcg in May by endocrinologist and she has not yet had follow-up levels.  States she has been under a lot of social stress recently: Son was admitted to [facility] last week, she has a sister who is an assisted living facility and has not been able to visit her, increased anxiety related to COVID-19.  States her blood pressures at home have been ranging from 120-130/60-70.  Nonetheless, every time she has been to this office her blood pressure has been elevated, is 140/96 today.   Past Medical/Surgical History: Past Medical History:  Diagnosis Date  . Anemia    after knee surgery ( only time)  . Anxiety   . Complication of anesthesia   . Depression   . History of urinary tract infection   . Hypertension    no longer on medications  since knee surgery and pain resolved  . Osteoarthritis   . PONV (postoperative nausea and vomiting)   . Situational anxiety 09/2003  . Situational depression   . Thyroid disease    hyper parathyroidism    Past Surgical History:  Procedure  Laterality Date  . abddominoplasty  1995  . APPENDECTOMY  Age 41  . BREAST REDUCTION SURGERY  09/2001   & Lift  . COLONOSCOPY    . EYE SURGERY     cataract bil  . ORIF ELBOW FRACTURE Left 03/15/2017   Procedure: OPEN REDUCTION INTERNAL FIXATION (ORIF) ELBOW/OLECRANON FRACTURE;  Surgeon: Altamese Middleville, MD;  Location: [facility];  Service: Orthopedics;  Laterality: Left;  . PARATHYROIDECTOMY  5/1/112   due to hypercalcemia  . SHOULDER SURGERY     left rotator cuff; bicep tendon repair on left   . THUMB FUSION  08/2007   with bone graft- post trauma Dr. Loney Loh  . THYROIDECTOMY N/A 10/23/2017   Procedure: TOTAL THYROIDECTOMY;  Surgeon: Armandina Gemma, MD;  Location: [facility];  Service: General;  Laterality: N/A;  . toatal knee replacement Right 2001  . TOTAL KNEE ARTHROPLASTY Left 08/02/2015   Procedure: LEFT TOTAL KNEE ARTHROPLASTY;  Surgeon: Gaynelle Arabian, MD;  Location: [facility];  Service: Orthopedics;  Laterality: Left;  . TOTAL THYROIDECTOMY     [place]  . TUBAL LIGATION  1994    Social History:  reports that she has never smoked. She has never used smokeless tobacco. She reports current alcohol use. She reports that she does not use drugs.  Allergies: Allergies  Allergen Reactions  . Ciprofloxacin Dermatitis, Other (See Comments) and Rash    Severe burning, blisters on toes  . Tetanus-Diphtheria Toxoids Td  Swelling    SWELLING REACTION UNSPECIFIED  [LOCAL ? Vs SYSTEMIC ?]  . Codeine Nausea And Vomiting  . Methimazole Itching and Rash    Blisters    Family History:  Family History  Problem Relation Age of Onset  . Coronary artery disease Father   . Heart disease Father   . Heart attack Father   . Coronary artery disease Brother   . Hypertension Brother   . Cancer Mother   . Leukemia Mother 42  . Osteoporosis Mother   . Hypertension Sister   . Osteoporosis Sister   . Hypertension Brother   . Osteoporosis Maternal Aunt      Current Outpatient Medications:  .  ALPRAZolam  (XANAX) 0.5 MG tablet, Take 1 tablet (0.5 mg total) by mouth at bedtime as needed. Office visit for further refills 06/05/2018, Disp: 120 tablet, Rfl: 0 .  escitalopram (LEXAPRO) 5 MG tablet, TAKE 1 TABLET(5 MG) BY MOUTH DAILY, Disp: 90 tablet, Rfl: 0 .  Estradiol (VAGIFEM) 10 MCG TABS vaginal tablet, Place 10 mcg vaginally 2 (two) times a week. On Sunday and Thursday, Disp: , Rfl:  .  ezetimibe (ZETIA) 10 MG tablet, TAKE 1 TABLET(10 MG) BY MOUTH DAILY, Disp: 90 tablet, Rfl: 4 .  levothyroxine (SYNTHROID) 88 MCG tablet, levothyroxine 88 mcg tablet, Disp: , Rfl:   Current Facility-Administered Medications:  .  ipratropium-albuterol (DUONEB) 0.5-2.5 (3) MG/3ML nebulizer solution 3 mL, 3 mL, Nebulization, Once, Nafziger, Cory, NP  Review of Systems:  Constitutional: Denies fever, chills, diaphoresis, appetite change and fatigue.  HEENT: Denies photophobia, eye pain, redness, hearing loss, ear pain, congestion, sore throat, rhinorrhea, sneezing, mouth sores, trouble swallowing, neck pain, neck stiffness and tinnitus.   Respiratory: Denies SOB, DOE, cough, chest tightness,  and wheezing.   Cardiovascular: Denies chest pain, palpitations and leg swelling.  Gastrointestinal: Denies nausea, vomiting, abdominal pain, diarrhea, constipation, blood in stool and abdominal distention.  Genitourinary: Denies dysuria, urgency, frequency, hematuria, flank pain and difficulty urinating.  Endocrine: Denies: hot or cold intolerance, sweats, changes in hair or nails, polyuria, polydipsia. Musculoskeletal: Denies myalgias, back pain, joint swelling, arthralgias and gait problem.  Skin: Denies pallor, rash and wound.  Neurological: Denies dizziness, seizures, syncope, weakness, light-headedness, numbness and headaches.  Hematological: Denies adenopathy. Easy bruising, personal or family bleeding history  Psychiatric/Behavioral: Denies suicidal ideation, mood changes, confusion, nervousness, sleep disturbance and  agitation    Physical Exam: Vitals:   09/10/18 0916 09/10/18 0939  BP: (!) 140/96 (!) 160/90  Pulse: 70   Temp: (!) 97.1 F (36.2 C)   TempSrc: Oral   SpO2: 97%   Weight: 131 lb 1.6 oz (59.5 kg)   Height: 5' 4.25" (1.632 m)     Body mass index is 22.33 kg/m.   Constitutional: NAD, calm, comfortable Eyes: PERRL, lids and conjunctivae normal ENMT: Mucous membranes are moist. Posterior pharynx clear of any exudate or lesions. Normal dentition. Tympanic membrane is pearly white, no erythema or bulging. Neck: normal, supple, no masses, no thyromegaly Respiratory: clear to auscultation bilaterally, no wheezing, no crackles. Normal respiratory effort. No accessory muscle use.  Cardiovascular: Regular rate and rhythm, no murmurs / rubs / gallops. No extremity edema. 2+ pedal pulses. No carotid bruits.  Abdomen: no tenderness, no masses palpated. No hepatosplenomegaly. Bowel sounds positive.  Musculoskeletal: no clubbing / cyanosis. No joint deformity upper and lower extremities. Good ROM, no contractures. Normal muscle tone.  Skin: no rashes, lesions, ulcers. No induration Neurologic: CN 2-12 grossly intact. Sensation intact, DTR  normal. Strength 5/5 in all 4.  Psychiatric: Normal judgment and insight. Alert and oriented x 3. Normal mood.   Subsequent Medicare wellness visit   1. Risk factors, based on past  M,S,F -cardiovascular disease risk factors include history of hypertension   2.  Physical activities: She remains quite physically active   3.  Depression/mood:  She does have depression/anxiety and admits to anxiety being increased recently due to multiple social stressors described above   4.  Hearing:  No issues   5.  ADL's: Independent in all ADLs   6.  Fall risk:  Low   7.  Home safety: No problems identified   8.  Height weight, and visual acuity: Height and weight as above, visual acuity 20/32 on the left, 20/25 on the right, 20/25 bilaterally   9.  Counseling:   Discussed ways to cope with stress, possibility of counseling sessions which he is not interested in at the moment, need for screening mammogram   10. Lab orders based on risk factors: Laboratory update will be reviewed   11. Referral :  None   12. Care plan:  Follow-up with me in 6 months   13. Cognitive assessment:  No cognitive impairments   14. Screening: Patient provided with a written and personalized 5-10 year screening schedule in the AVS.   yes   15. Provider List Update:   PCP, ophthalmologist (unknown provider)  16. Advance Directives: Full code     Office Visit from 09/10/2018 in [place] at [facility] Total Score  4      Fall Risk  09/10/2018 01/16/2018 05/14/2017 07/19/2015 03/10/2014  Falls in the past year? 1 0 Yes No Yes  Number falls in past yr: 0 0 1 - 1  Injury with Fall? 1 0 Yes - Yes  Comment - - tripped over the dog and fell on elbo - -  Risk for fall due to : - - - - Impaired balance/gait     Impression and Plan:  Encounter for preventive health examination  -She has routine eye and dental care. -Have discussed healthy lifestyle in detail today. -Screening labs to be performed today. -She had a colonoscopy in 2018 and is a 10-year recall. -She is overdue for mammogram, I have offered to order it today, however she states she gets this every year with her GYN and is due for an annual with them in August. -Cervical cancer screening with GYN. -She recently had a DEXA scan for which I do not have a report yet.  She recently had a fragility fracture of her wrist so we will need to follow-up results to see if she needs bisphosphonate therapy. -She will receive Pneumovax today, states she has an allergy to tetanus and would prefer not to receive today.  We discussed shingles vaccination which she will consider getting at her pharmacy.  Essential hypertension  -I continue to bring this up at every visit with her. -She states her blood pressures  at home have been normal, however every visit in the office it has been elevated.  Today it is 140/96. -She has been on lisinopril in the past, however this was discontinued after knee surgery when she was noted to be lightheaded and hypotensive. -Have advised to continue ambulatory blood pressure monitoring, advised that if elevated at next office visit we will have to seriously consider starting antihypertensive therapy.  Iatrogenic hypothyroidism  -Followed by endocrine. -Check TSH today as she had levothyroxine dose change in  May.  Dyslipidemia -Last LDL was 116 in March 2019, recheck lipids today.  Current mild episode of major depressive disorder without prior episode (HCC) -Mood is stable on Lexapro.  Anxiety -She has been taking alprazolam at bedtime for insomnia and has an old prescription of diazepam that she has been using very sporadically maybe 2-3 times a month during the daytime for anxiety.  She is asking for refills of both today. -Instead of prescribing 2 benzodiazepines, I will instead slightly increase her alprazolam prescription so she can have 40 tablets a month as opposed to 30, she agrees to this change. -PDMP has been reviewed, no red flags.    Patient Instructions  -Nice seeing you today!!  -Lab work today; will notify you once results are available.  -Keep a watch on your blood pressure. I want to know if they are above 130/80 consistently.  -Schedule follow up in 4 months.   Preventive Care 31 Years and Older, Female Preventive care refers to lifestyle choices and visits with your health care provider that can promote health and wellness. This includes:  A yearly physical exam. This is also called an annual well check.  Regular dental and eye exams.  Immunizations.  Screening for certain conditions.  Healthy lifestyle choices, such as diet and exercise. What can I expect for my preventive care visit? Physical exam Your health care provider will  check:  Height and weight. These may be used to calculate body mass index (BMI), which is a measurement that tells if you are at a healthy weight.  Heart rate and blood pressure.  Your skin for abnormal spots. Counseling Your health care provider may ask you questions about:  Alcohol, tobacco, and drug use.  Emotional well-being.  Home and relationship well-being.  Sexual activity.  Eating habits.  History of falls.  Memory and ability to understand (cognition).  Work and work Statistician.  Pregnancy and menstrual history. What immunizations do I need?  Influenza (flu) vaccine  This is recommended every year. Tetanus, diphtheria, and pertussis (Tdap) vaccine  You may need a Td booster every 10 years. Varicella (chickenpox) vaccine  You may need this vaccine if you have not already been vaccinated. Zoster (shingles) vaccine  You may need this after age 35. Pneumococcal conjugate (PCV13) vaccine  One dose is recommended after age 28. Pneumococcal polysaccharide (PPSV23) vaccine  One dose is recommended after age 85. Measles, mumps, and rubella (MMR) vaccine  You may need at least one dose of MMR if you were born in 1957 or later. You may also need a second dose. Meningococcal conjugate (MenACWY) vaccine  You may need this if you have certain conditions. Hepatitis A vaccine  You may need this if you have certain conditions or if you travel or work in places where you may be exposed to hepatitis A. Hepatitis B vaccine  You may need this if you have certain conditions or if you travel or work in places where you may be exposed to hepatitis B. Haemophilus influenzae type b (Hib) vaccine  You may need this if you have certain conditions. You may receive vaccines as individual doses or as more than one vaccine together in one shot (combination vaccines). Talk with your health care provider about the risks and benefits of combination vaccines. What tests do I  need? Blood tests  Lipid and cholesterol levels. These may be checked every 5 years, or more frequently depending on your overall health.  Hepatitis C test.  Hepatitis B test.  Screening  Lung cancer screening. You may have this screening every year starting at age 48 if you have a 30-pack-year history of smoking and currently smoke or have quit within the past 15 years.  Colorectal cancer screening. All adults should have this screening starting at age 51 and continuing until age 72. Your health care provider may recommend screening at age 42 if you are at increased risk. You will have tests every 1-10 years, depending on your results and the type of screening test.  Diabetes screening. This is done by checking your blood sugar (glucose) after you have not eaten for a while (fasting). You may have this done every 1-3 years.  Mammogram. This may be done every 1-2 years. Talk with your health care provider about how often you should have regular mammograms.  BRCA-related cancer screening. This may be done if you have a family history of breast, ovarian, tubal, or peritoneal cancers. Other tests  Sexually transmitted disease (STD) testing.  Bone density scan. This is done to screen for osteoporosis. You may have this done starting at age 60. Follow these instructions at home: Eating and drinking  Eat a diet that includes fresh fruits and vegetables, whole grains, lean protein, and low-fat dairy products. Limit your intake of foods with high amounts of sugar, saturated fats, and salt.  Take vitamin and mineral supplements as recommended by your health care provider.  Do not drink alcohol if your health care provider tells you not to drink.  If you drink alcohol: ? Limit how much you have to 0-1 drink a day. ? Be aware of how much alcohol is in your drink. In the [place] one drink equals one 12 oz bottle of beer (355 mL), one 5 oz glass of wine (148 mL), or one 1 oz glass of hard liquor  (44 mL). Lifestyle  Take daily care of your teeth and gums.  Stay active. Exercise for at least 30 minutes on 5 or more days each week.  Do not use any products that contain nicotine or tobacco, such as cigarettes, e-cigarettes, and chewing tobacco. If you need help quitting, ask your health care provider.  If you are sexually active, practice safe sex. Use a condom or other form of protection in order to prevent STIs (sexually transmitted infections).  Talk with your health care provider about taking a low-dose aspirin or statin. What's next?  Go to your health care provider once a year for a well check visit.  Ask your health care provider how often you should have your eyes and teeth checked.  Stay up to date on all vaccines. This information is not intended to replace advice given to you by your health care provider. Make sure you discuss any questions you have with your health care provider. Document Released: 03/12/2015 Document Revised: 02/07/2018 Document Reviewed: 02/07/2018 Elsevier Patient Education  2020 [place], [place] [facility]

## 2018-09-16 ENCOUNTER — Other Ambulatory Visit: Payer: Self-pay | Admitting: Internal Medicine

## 2018-09-16 DIAGNOSIS — F320 Major depressive disorder, single episode, mild: Secondary | ICD-10-CM

## 2018-10-22 ENCOUNTER — Telehealth: Payer: Self-pay | Admitting: *Deleted

## 2018-10-22 ENCOUNTER — Other Ambulatory Visit: Payer: Self-pay | Admitting: *Deleted

## 2018-10-22 ENCOUNTER — Encounter: Payer: Self-pay | Admitting: Internal Medicine

## 2018-10-22 MED ORDER — EZETIMIBE 10 MG PO TABS: ORAL_TABLET | ORAL | 1 refills | Status: DC

## 2018-10-22 NOTE — Telephone Encounter (Signed)
Per Dr Jerilee Hoh. Patient should schedule an office visit to discuss bone density result.  Left message on machine.

## 2018-11-06 DIAGNOSIS — H35371 Puckering of macula, right eye: Secondary | ICD-10-CM | POA: Diagnosis not present

## 2018-11-06 DIAGNOSIS — Z961 Presence of intraocular lens: Secondary | ICD-10-CM | POA: Diagnosis not present

## 2018-11-12 DIAGNOSIS — E890 Postprocedural hypothyroidism: Secondary | ICD-10-CM | POA: Diagnosis not present

## 2018-12-20 IMAGING — US US THYROID
1 series · 13 of 25 positions shown · non-contrast
Comparison: None.

CLINICAL DATA: Thyrotoxicosis.

EXAM:
THYROID ULTRASOUND
TECHNIQUE: Ultrasound examination of the thyroid gland and adjacent soft
tissues was performed.

[Series 1: us thyroid · 0.05mm/px · 13 of 52 slices shown]
[im 1/52]
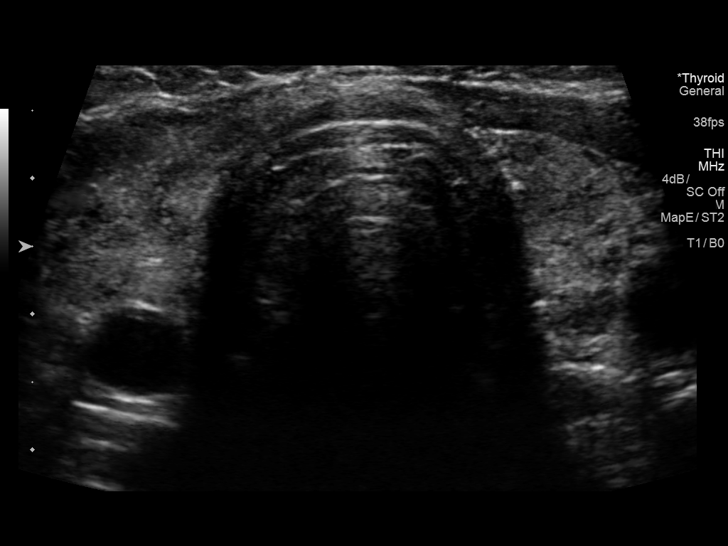
[im 5/52]
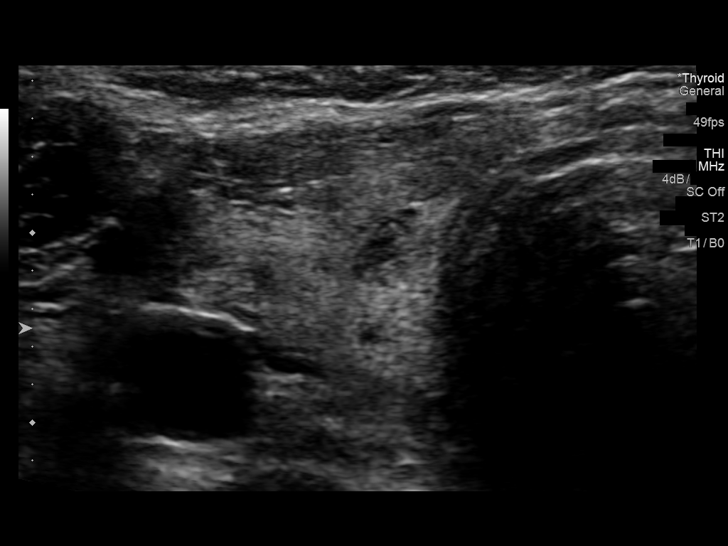
[im 9/52]
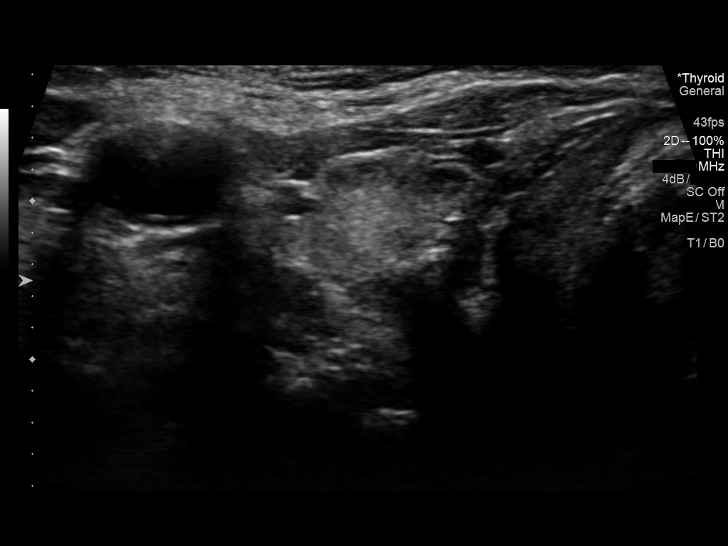
[im 13/52]
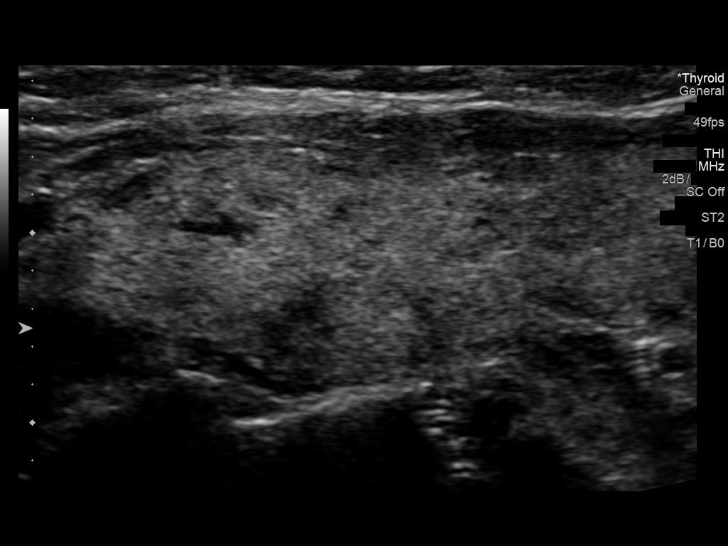
[im 18/52]
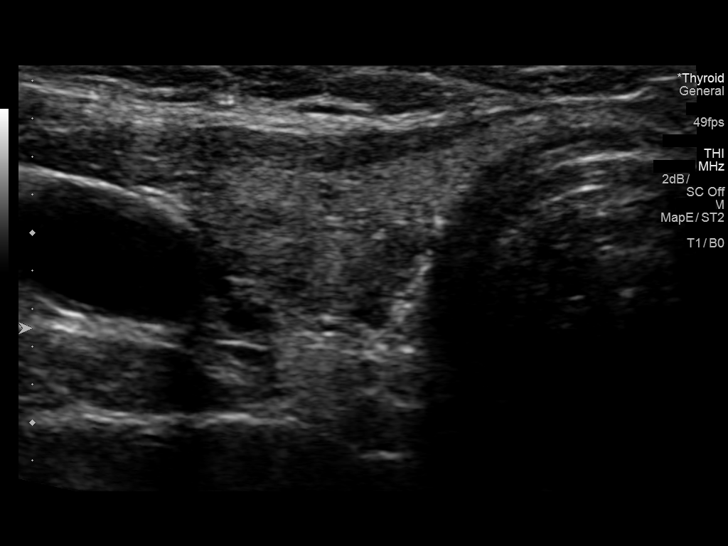
[im 22/52]
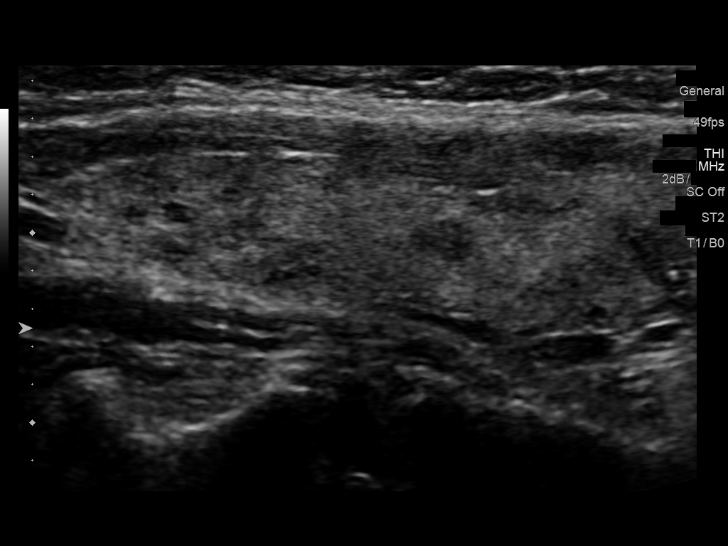
[im 26/52]
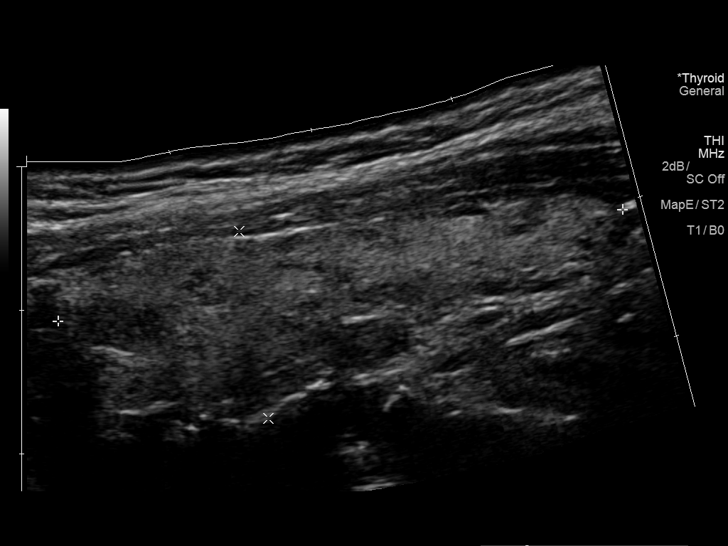
[im 30/52]
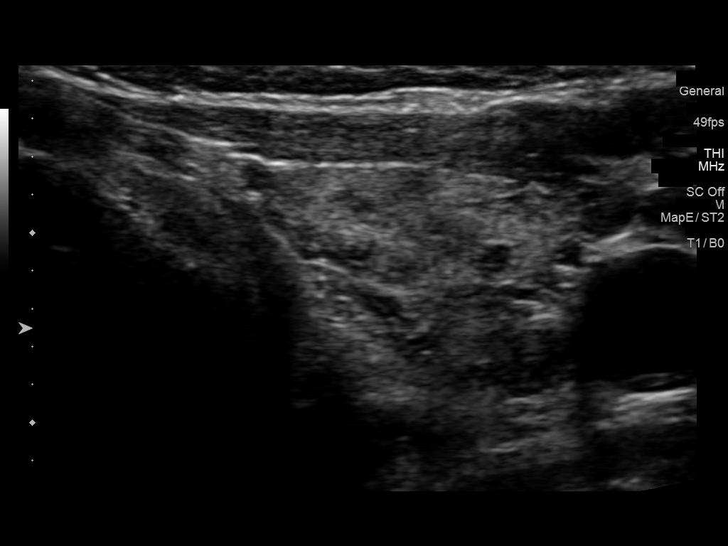
[im 35/52]
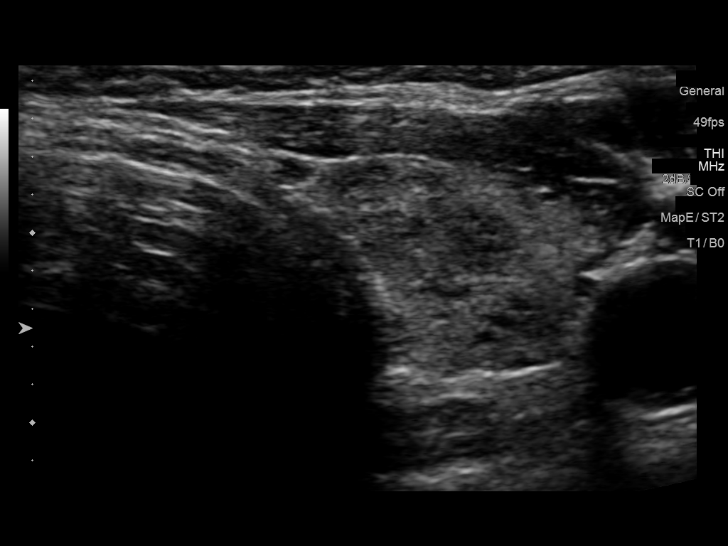
[im 39/52]
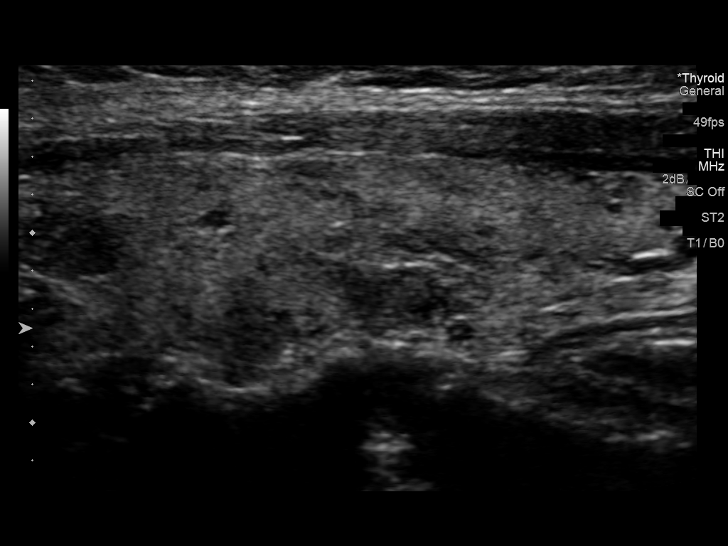
[im 43/52]
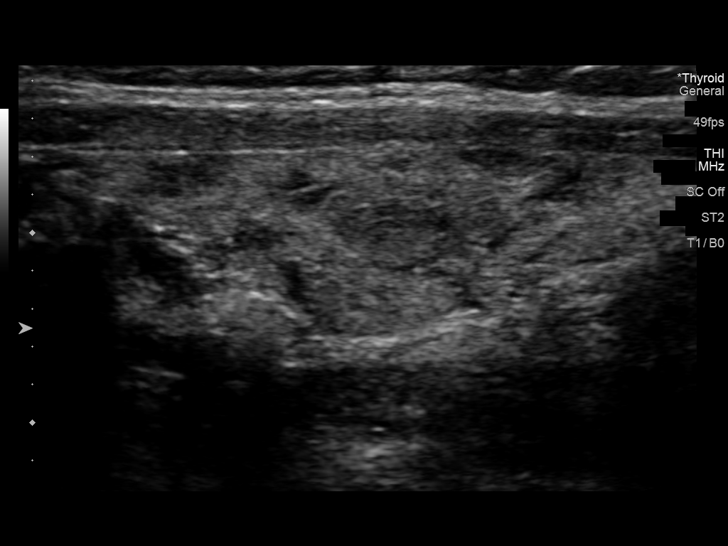
[im 47/52]
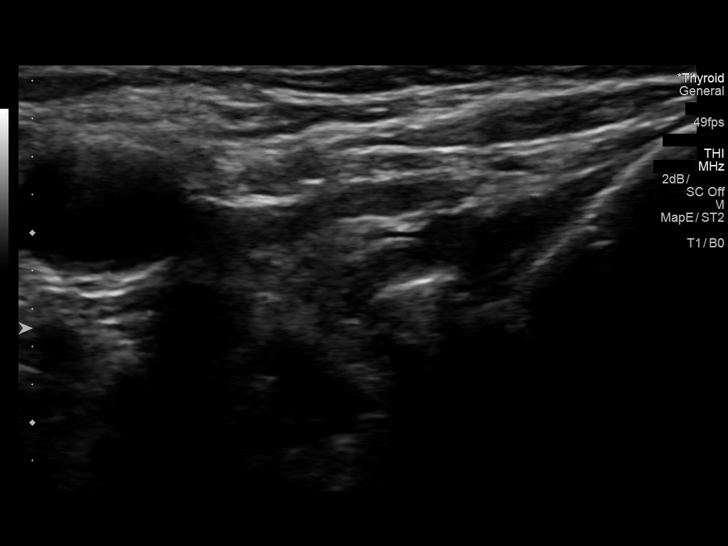
[im 52/52]
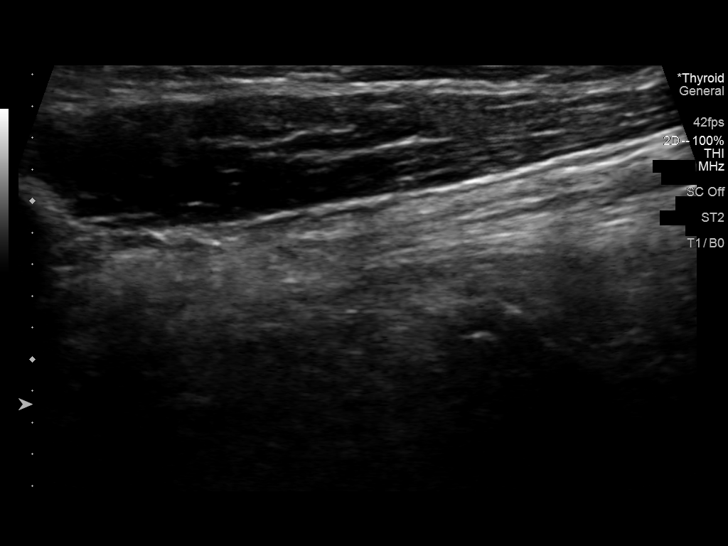

[13 of 25 positions shown; findings below may reference images not displayed]

FINDINGS: Parenchymal Echotexture: Markedly heterogenous

Isthmus: 0.2 cm

Right lobe: 4.9 x 1.7 x 1.4 cm

Left lobe: 4.0 x 1.3 x 1.1 cm

_________________________________________________________

Estimated total number of nodules >/= 1 cm: 1

Number of spongiform nodules >/=  2 cm not described below (TR1): 0

Number of mixed cystic and solid nodules >/= 1.5 cm not described
below (TR2): 0

_________________________________________________________

Nodule # 1:

Location: Right; Mid

Maximum size: 0.9 cm; Other 2 dimensions: 0.7 x 0.7 cm

Composition: solid/almost completely solid (2)

Echogenicity: hypoechoic (2)

Shape: not taller-than-wide (0)

Margins: ill-defined (0)

Echogenic foci: none (0)

ACR TI-RADS total points: 4.

ACR TI-RADS risk category: TR4 (4-6 points).

ACR TI-RADS recommendations:

Given size (<0.9 cm) and appearance, this nodule does NOT meet
TI-RADS criteria for biopsy or dedicated follow-up.

_________________________________________________________

Nodule # 2:

Location: Left; Mid

Maximum size: 1.0 cm; Other 2 dimensions: 0.6 x 0.9 cm

Composition: cannot determine (2)

Echogenicity: hypoechoic (2)

Shape: not taller-than-wide (0)

Margins: ill-defined (0)

Echogenic foci: none (0)

ACR TI-RADS total points: 4.

ACR TI-RADS risk category: TR4 (4-6 points).

ACR TI-RADS recommendations:

*Given size (>/= 1 - 1.4 cm) and appearance, a follow-up ultrasound
in 1 year should be considered based on TI-RADS criteria.

_________________________________________________________

No lymph node enlargement.
IMPRESSION: Thyroid tissue is markedly heterogeneous with poorly defined
nodules.

Largest nodule is in the left thyroid lobe measuring 1.0 cm. This is
a TR 4 nodule and meets criteria for 1 year follow-up.

The above is in keeping with the ACR TI-RADS recommendations - [HOSPITAL] 9342;[DATE].

## 2019-01-06 ENCOUNTER — Other Ambulatory Visit: Payer: Self-pay | Admitting: Internal Medicine

## 2019-01-06 DIAGNOSIS — F419 Anxiety disorder, unspecified: Secondary | ICD-10-CM

## 2019-02-25 DIAGNOSIS — Z01411 Encounter for gynecological examination (general) (routine) with abnormal findings: Secondary | ICD-10-CM | POA: Diagnosis not present

## 2019-02-25 DIAGNOSIS — Z6822 Body mass index (BMI) 22.0-22.9, adult: Secondary | ICD-10-CM | POA: Diagnosis not present

## 2019-02-25 DIAGNOSIS — M8580 Other specified disorders of bone density and structure, unspecified site: Secondary | ICD-10-CM | POA: Diagnosis not present

## 2019-02-25 DIAGNOSIS — N816 Rectocele: Secondary | ICD-10-CM | POA: Diagnosis not present

## 2019-02-25 DIAGNOSIS — C73 Malignant neoplasm of thyroid gland: Secondary | ICD-10-CM | POA: Diagnosis not present

## 2019-02-25 DIAGNOSIS — Z1231 Encounter for screening mammogram for malignant neoplasm of breast: Secondary | ICD-10-CM | POA: Diagnosis not present

## 2019-02-25 DIAGNOSIS — N952 Postmenopausal atrophic vaginitis: Secondary | ICD-10-CM | POA: Diagnosis not present

## 2019-04-25 DIAGNOSIS — M8580 Other specified disorders of bone density and structure, unspecified site: Secondary | ICD-10-CM | POA: Diagnosis not present

## 2019-04-29 ENCOUNTER — Other Ambulatory Visit: Payer: Self-pay | Admitting: Internal Medicine

## 2019-04-29 DIAGNOSIS — F419 Anxiety disorder, unspecified: Secondary | ICD-10-CM

## 2019-05-03 IMAGING — CR DG CHEST 2V
2 series · 2 of 2 positions shown · non-contrast
Comparison: June 21, 2010

CLINICAL DATA: Preoperative thyroid surgery

EXAM:
CHEST - 2 VIEW

[w chest pa]
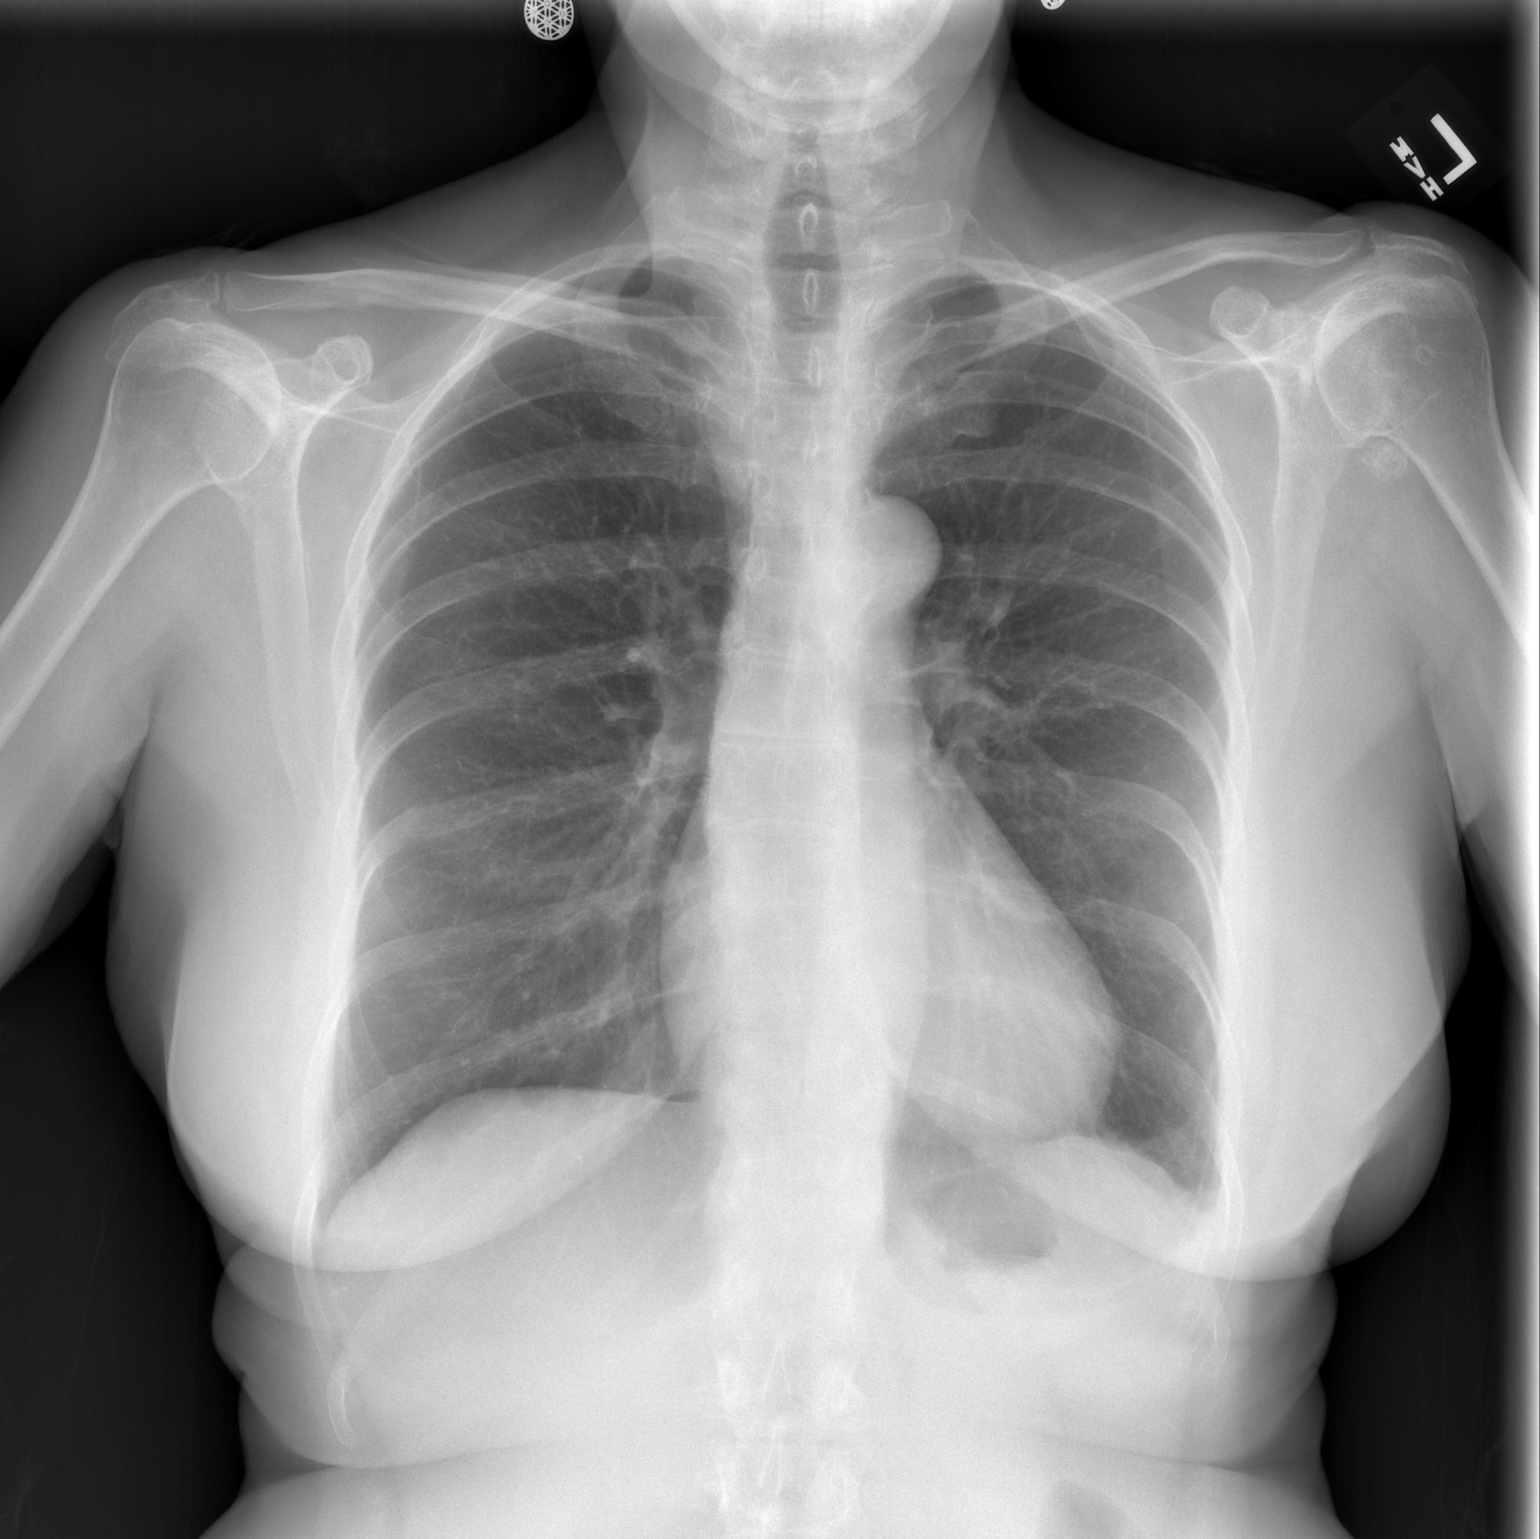

[w chest lat]
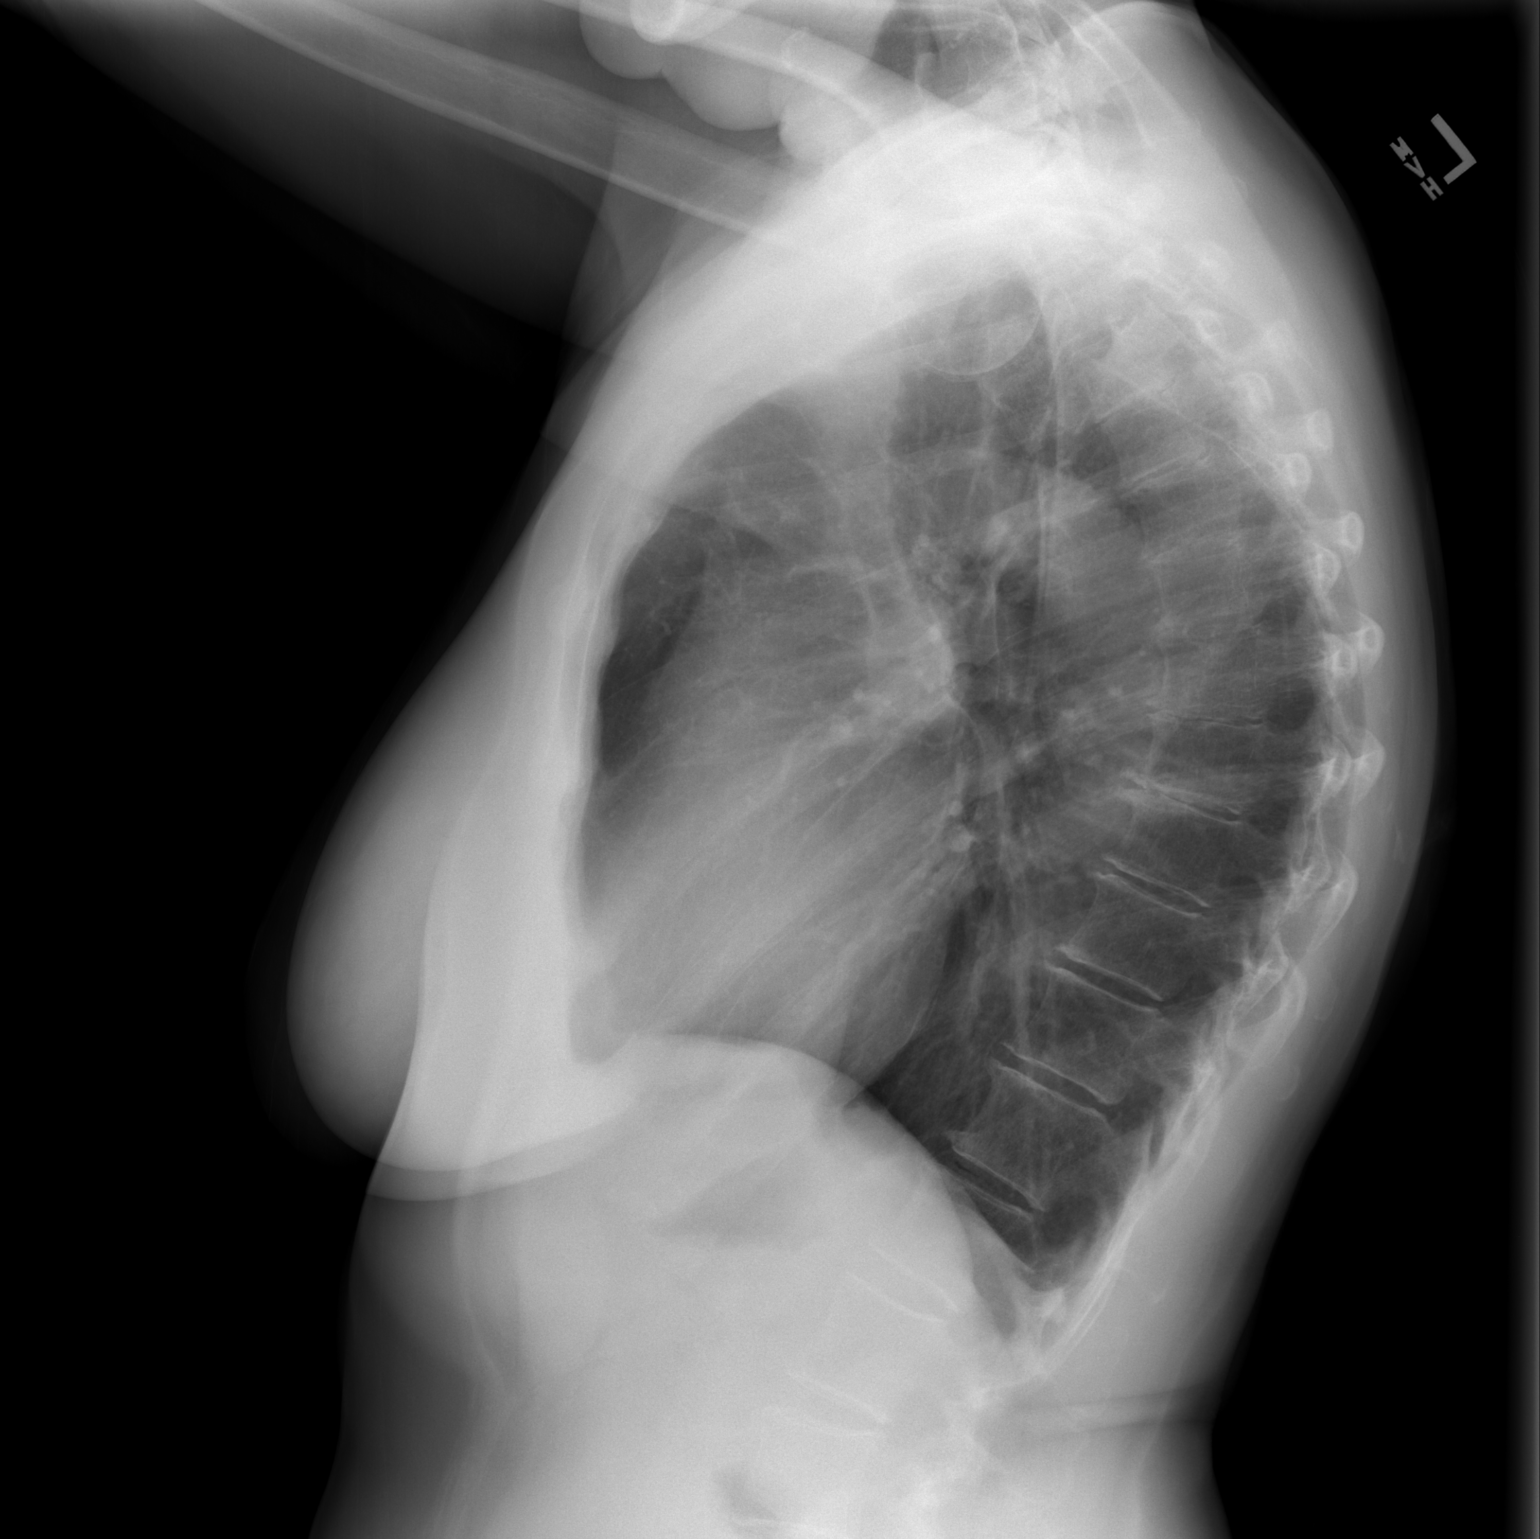

[2 of 2 positions shown; findings below may reference images not displayed]

FINDINGS: Lungs are clear. Heart size and pulmonary vascularity are normal. No
adenopathy. Trachea is midline. There is mild degenerative change in
the thoracic spine.
IMPRESSION: No edema or consolidation.  No adenopathy.  Trachea midline.

## 2019-05-14 DIAGNOSIS — C73 Malignant neoplasm of thyroid gland: Secondary | ICD-10-CM | POA: Diagnosis not present

## 2019-05-14 DIAGNOSIS — E890 Postprocedural hypothyroidism: Secondary | ICD-10-CM | POA: Diagnosis not present

## 2019-05-21 DIAGNOSIS — C73 Malignant neoplasm of thyroid gland: Secondary | ICD-10-CM | POA: Diagnosis not present

## 2019-05-21 DIAGNOSIS — E213 Hyperparathyroidism, unspecified: Secondary | ICD-10-CM | POA: Diagnosis not present

## 2019-05-21 DIAGNOSIS — M8580 Other specified disorders of bone density and structure, unspecified site: Secondary | ICD-10-CM | POA: Diagnosis not present

## 2019-05-21 DIAGNOSIS — M792 Neuralgia and neuritis, unspecified: Secondary | ICD-10-CM | POA: Diagnosis not present

## 2019-05-21 DIAGNOSIS — E890 Postprocedural hypothyroidism: Secondary | ICD-10-CM | POA: Diagnosis not present

## 2019-05-30 DIAGNOSIS — M8580 Other specified disorders of bone density and structure, unspecified site: Secondary | ICD-10-CM | POA: Insufficient documentation

## 2019-07-01 ENCOUNTER — Other Ambulatory Visit: Payer: Self-pay | Admitting: Internal Medicine

## 2019-07-01 DIAGNOSIS — F320 Major depressive disorder, single episode, mild: Secondary | ICD-10-CM

## 2019-07-22 ENCOUNTER — Other Ambulatory Visit: Payer: Self-pay | Admitting: Family

## 2019-07-22 DIAGNOSIS — F419 Anxiety disorder, unspecified: Secondary | ICD-10-CM

## 2019-07-29 NOTE — Telephone Encounter (Signed)
Pt calling in checking the status of the medication refill.  Pt is aware that the request has been received and it will be addressed.

## 2019-09-30 DIAGNOSIS — M25512 Pain in left shoulder: Secondary | ICD-10-CM | POA: Diagnosis not present

## 2019-09-30 DIAGNOSIS — M79602 Pain in left arm: Secondary | ICD-10-CM | POA: Diagnosis not present

## 2019-10-01 DIAGNOSIS — M25512 Pain in left shoulder: Secondary | ICD-10-CM | POA: Diagnosis not present

## 2019-10-02 ENCOUNTER — Encounter: Payer: Self-pay | Admitting: Internal Medicine

## 2019-10-02 ENCOUNTER — Ambulatory Visit (INDEPENDENT_AMBULATORY_CARE_PROVIDER_SITE_OTHER): Payer: PPO | Admitting: Internal Medicine

## 2019-10-02 ENCOUNTER — Other Ambulatory Visit: Payer: Self-pay

## 2019-10-02 VITALS — BP 140/90 | HR 72 | Temp 98.2°F | Ht 64.5 in | Wt 134.1 lb

## 2019-10-02 DIAGNOSIS — F411 Generalized anxiety disorder: Secondary | ICD-10-CM

## 2019-10-02 DIAGNOSIS — Z0000 Encounter for general adult medical examination without abnormal findings: Secondary | ICD-10-CM | POA: Diagnosis not present

## 2019-10-02 DIAGNOSIS — I10 Essential (primary) hypertension: Secondary | ICD-10-CM | POA: Diagnosis not present

## 2019-10-02 DIAGNOSIS — E785 Hyperlipidemia, unspecified: Secondary | ICD-10-CM | POA: Diagnosis not present

## 2019-10-02 DIAGNOSIS — N951 Menopausal and female climacteric states: Secondary | ICD-10-CM

## 2019-10-02 DIAGNOSIS — E032 Hypothyroidism due to medicaments and other exogenous substances: Secondary | ICD-10-CM | POA: Diagnosis not present

## 2019-10-02 MED ORDER — ESCITALOPRAM OXALATE 10 MG PO TABS: 10.0000 mg | ORAL_TABLET | Freq: Every day | ORAL | 1 refills | Status: DC

## 2019-10-02 MED ORDER — ESTRADIOL 10 MCG VA TABS: 10.0000 ug | ORAL_TABLET | VAGINAL | 11 refills | Status: DC

## 2019-10-02 NOTE — Patient Instructions (Signed)
-Nice seeing you today!!  -Lab work today; will notify you once results are available.  -Check BP at home 2-3 times a week and send data to me once you have 6 weeks worth of measurements.  -Schedule follow up in 1 year or sooner as needed.   Preventive Care 19 Years and Older, Female Preventive care refers to lifestyle choices and visits with your health care provider that can promote health and wellness. This includes:  A yearly physical exam. This is also called an annual well check.  Regular dental and eye exams.  Immunizations.  Screening for certain conditions.  Healthy lifestyle choices, such as diet and exercise. What can I expect for my preventive care visit? Physical exam Your health care provider will check:  Height and weight. These may be used to calculate body mass index (BMI), which is a measurement that tells if you are at a healthy weight.  Heart rate and blood pressure.  Your skin for abnormal spots. Counseling Your health care provider may ask you questions about:  Alcohol, tobacco, and drug use.  Emotional well-being.  Home and relationship well-being.  Sexual activity.  Eating habits.  History of falls.  Memory and ability to understand (cognition).  Work and work Statistician.  Pregnancy and menstrual history. What immunizations do I need?  Influenza (flu) vaccine  This is recommended every year. Tetanus, diphtheria, and pertussis (Tdap) vaccine  You may need a Td booster every 10 years. Varicella (chickenpox) vaccine  You may need this vaccine if you have not already been vaccinated. Zoster (shingles) vaccine  You may need this after age 64. Pneumococcal conjugate (PCV13) vaccine  One dose is recommended after age 3. Pneumococcal polysaccharide (PPSV23) vaccine  One dose is recommended after age 15. Measles, mumps, and rubella (MMR) vaccine  You may need at least one dose of MMR if you were born in 1957 or later. You may  also need a second dose. Meningococcal conjugate (MenACWY) vaccine  You may need this if you have certain conditions. Hepatitis A vaccine  You may need this if you have certain conditions or if you travel or work in places where you may be exposed to hepatitis A. Hepatitis B vaccine  You may need this if you have certain conditions or if you travel or work in places where you may be exposed to hepatitis B. Haemophilus influenzae type b (Hib) vaccine  You may need this if you have certain conditions. You may receive vaccines as individual doses or as more than one vaccine together in one shot (combination vaccines). Talk with your health care provider about the risks and benefits of combination vaccines. What tests do I need? Blood tests  Lipid and cholesterol levels. These may be checked every 5 years, or more frequently depending on your overall health.  Hepatitis C test.  Hepatitis B test. Screening  Lung cancer screening. You may have this screening every year starting at age 86 if you have a 30-pack-year history of smoking and currently smoke or have quit within the past 15 years.  Colorectal cancer screening. All adults should have this screening starting at age 17 and continuing until age 32. Your health care provider may recommend screening at age 17 if you are at increased risk. You will have tests every 1-10 years, depending on your results and the type of screening test.  Diabetes screening. This is done by checking your blood sugar (glucose) after you have not eaten for a while (fasting). You may  have this done every 1-3 years.  Mammogram. This may be done every 1-2 years. Talk with your health care provider about how often you should have regular mammograms.  BRCA-related cancer screening. This may be done if you have a family history of breast, ovarian, tubal, or peritoneal cancers. Other tests  Sexually transmitted disease (STD) testing.  Bone density scan. This is  done to screen for osteoporosis. You may have this done starting at age 61. Follow these instructions at home: Eating and drinking  Eat a diet that includes fresh fruits and vegetables, whole grains, lean protein, and low-fat dairy products. Limit your intake of foods with high amounts of sugar, saturated fats, and salt.  Take vitamin and mineral supplements as recommended by your health care provider.  Do not drink alcohol if your health care provider tells you not to drink.  If you drink alcohol: ? Limit how much you have to 0-1 drink a day. ? Be aware of how much alcohol is in your drink. In the [place] one drink equals one 12 oz bottle of beer (355 mL), one 5 oz glass of wine (148 mL), or one 1 oz glass of hard liquor (44 mL). Lifestyle  Take daily care of your teeth and gums.  Stay active. Exercise for at least 30 minutes on 5 or more days each week.  Do not use any products that contain nicotine or tobacco, such as cigarettes, e-cigarettes, and chewing tobacco. If you need help quitting, ask your health care provider.  If you are sexually active, practice safe sex. Use a condom or other form of protection in order to prevent STIs (sexually transmitted infections).  Talk with your health care provider about taking a low-dose aspirin or statin. What's next?  Go to your health care provider once a year for a well check visit.  Ask your health care provider how often you should have your eyes and teeth checked.  Stay up to date on all vaccines. This information is not intended to replace advice given to you by your health care provider. Make sure you discuss any questions you have with your health care provider. Document Revised: 02/07/2018 Document Reviewed: 02/07/2018 Elsevier Patient Education  2020 [employer].

## 2019-10-02 NOTE — Progress Notes (Signed)
Established Patient Office Visit     This visit occurred during the SARS-CoV-2 public health emergency.  Safety protocols were in place, including screening questions prior to the visit, additional usage of staff PPE, and extensive cleaning of exam room while observing appropriate contact time as indicated for disinfecting solutions.    CC/Reason for Visit: Annual preventive exam and subsequent Medicare wellness visit  HPI: Cassandra Mcfarland is a 69 y.o. female who is coming in today for the above mentioned reasons. Past Medical History is significant for:  Multinodular goiter with clinical hyperthyroidism who recently underwent total thyroidectomy in August 2019.Now hypothyroid on Synthroid.Followed by Dr. Chalmers Cater with endocrinology. Also history of hypertension(taken off blood pressure medication 3 years ago), hyperlipidemia.  She takes alprazolam 0.5 mg at bedtime for insomnia.  She self increased her Lexapro dose from 5 to 10 mg and feels that she is doing much better.  She unfortunately fell last week and has significant limitation in range of motion of her left shoulder.  She saw her orthopedics and had an MRI yesterday with results that are still pending.  She has routine eye and dental care, she does not exercise routinely, she has no hearing issues.  She was concerned about her elevated blood pressure in office today.  At home her blood pressures are in the 120/60 range.   Past Medical/Surgical History: Past Medical History:  Diagnosis Date  . Anemia    after knee surgery ( only time)  . Anxiety   . Complication of anesthesia   . Depression   . History of urinary tract infection   . Hypertension    no longer on medications  since knee surgery and pain resolved  . Osteoarthritis   . PONV (postoperative nausea and vomiting)   . Situational anxiety 09/2003  . Situational depression   . Thyroid disease    hyper parathyroidism    Past Surgical History:  Procedure  Laterality Date  . abddominoplasty  1995  . APPENDECTOMY  Age 80  . BREAST REDUCTION SURGERY  09/2001   & Lift  . COLONOSCOPY    . EYE SURGERY     cataract bil  . ORIF ELBOW FRACTURE Left 03/15/2017   Procedure: OPEN REDUCTION INTERNAL FIXATION (ORIF) ELBOW/OLECRANON FRACTURE;  Surgeon: Altamese Tiffin, MD;  Location: [facility];  Service: Orthopedics;  Laterality: Left;  . PARATHYROIDECTOMY  5/1/112   due to hypercalcemia  . SHOULDER SURGERY     left rotator cuff; bicep tendon repair on left   . THUMB FUSION  08/2007   with bone graft- post trauma Dr. Loney Loh  . THYROIDECTOMY N/A 10/23/2017   Procedure: TOTAL THYROIDECTOMY;  Surgeon: Armandina Gemma, MD;  Location: [facility];  Service: General;  Laterality: N/A;  . toatal knee replacement Right 2001  . TOTAL KNEE ARTHROPLASTY Left 08/02/2015   Procedure: LEFT TOTAL KNEE ARTHROPLASTY;  Surgeon: Gaynelle Arabian, MD;  Location: [facility];  Service: Orthopedics;  Laterality: Left;  . TOTAL THYROIDECTOMY     [place]  . TUBAL LIGATION  1994    Social History:  reports that she has never smoked. She has never used smokeless tobacco. She reports current alcohol use. She reports that she does not use drugs.  Allergies: Allergies  Allergen Reactions  . Ciprofloxacin Dermatitis, Other (See Comments) and Rash    Severe burning, blisters on toes  . Tetanus-Diphtheria Toxoids Td Swelling    SWELLING REACTION UNSPECIFIED  [LOCAL ? Vs SYSTEMIC ?]  . Codeine Nausea  And Vomiting  . Methimazole Itching and Rash    Blisters    Family History:  Family History  Problem Relation Age of Onset  . Coronary artery disease Father   . Heart disease Father   . Heart attack Father   . Coronary artery disease Brother   . Hypertension Brother   . Cancer Mother   . Leukemia Mother 10  . Osteoporosis Mother   . Hypertension Sister   . Osteoporosis Sister   . Hypertension Brother   . Osteoporosis Maternal Aunt      Current Outpatient Medications:  .  ALPRAZolam  (XANAX) 0.5 MG tablet, TAKE 1 TABLET BY MOUTH AT BEDTIME AS NEEDED, Disp: 90 tablet, Rfl: 0 .  Biotin 10 MG CAPS, Take by mouth., Disp: , Rfl:  .  escitalopram (LEXAPRO) 10 MG tablet, Take 1 tablet (10 mg total) by mouth daily., Disp: 90 tablet, Rfl: 1 .  Estradiol (VAGIFEM) 10 MCG TABS vaginal tablet, Place 1 tablet (10 mcg total) vaginally 2 (two) times a week. On Sunday and Thursday, Disp: 18 tablet, Rfl: 11 .  ezetimibe (ZETIA) 10 MG tablet, TAKE 1 TABLET(10 MG) BY MOUTH DAILY, Disp: 90 tablet, Rfl: 0 .  levothyroxine (SYNTHROID) 88 MCG tablet, levothyroxine 88 mcg tablet, Disp: , Rfl:  .  Multiple Vitamin (MULTIVITAMIN ADULT PO), Take by mouth., Disp: , Rfl:  .  Multiple Vitamins-Minerals (PRESERVISION AREDS PO), Take by mouth., Disp: , Rfl:   Current Facility-Administered Medications:  .  ipratropium-albuterol (DUONEB) 0.5-2.5 (3) MG/3ML nebulizer solution 3 mL, 3 mL, Nebulization, Once, Nafziger, Cory, NP  Review of Systems:  Constitutional: Denies fever, chills, diaphoresis, appetite change and fatigue.  HEENT: Denies photophobia, eye pain, redness, hearing loss, ear pain, congestion, sore throat, rhinorrhea, sneezing, mouth sores, trouble swallowing, neck pain, neck stiffness and tinnitus.   Respiratory: Denies SOB, DOE, cough, chest tightness,  and wheezing.   Cardiovascular: Denies chest pain, palpitations and leg swelling.  Gastrointestinal: Denies nausea, vomiting, abdominal pain, diarrhea, constipation, blood in stool and abdominal distention.  Genitourinary: Denies dysuria, urgency, frequency, hematuria, flank pain and difficulty urinating.  Endocrine: Denies: hot or cold intolerance, sweats, changes in hair or nails, polyuria, polydipsia. Musculoskeletal: Denies myalgias, back pain, joint swelling, arthralgias and gait problem.  Skin: Denies pallor, rash and wound.  Neurological: Denies dizziness, seizures, syncope, weakness, light-headedness, numbness and headaches.    Hematological: Denies adenopathy. Easy bruising, personal or family bleeding history  Psychiatric/Behavioral: Denies suicidal ideation, mood changes, confusion, nervousness, sleep disturbance and agitation    Physical Exam: Vitals:   10/02/19 1519  BP: 140/90  Pulse: 72  Temp: 98.2 F (36.8 C)  TempSrc: Oral  SpO2: 97%  Weight: 134 lb 1.6 oz (60.8 kg)  Height: 5' 4.5" (1.638 m)    Body mass index is 22.66 kg/m.   Constitutional: NAD, calm, comfortable Eyes: PERRL, lids and conjunctivae normal ENMT: Mucous membranes are moist.  Tympanic membrane is pearly white, no erythema or bulging. Neck: normal, supple, no masses, no thyromegaly Respiratory: clear to auscultation bilaterally, no wheezing, no crackles. Normal respiratory effort. No accessory muscle use.  Cardiovascular: Regular rate and rhythm, no murmurs / rubs / gallops. No extremity edema. 2+ pedal pulses.  Abdomen: no tenderness, no masses palpated. No hepatosplenomegaly. Bowel sounds positive.  Musculoskeletal: no clubbing / cyanosis. No joint deformity upper and lower extremities. Good ROM, no contractures. Normal muscle tone.  Skin: no rashes, lesions, ulcers. No induration Neurologic: CN 2-12 grossly intact. Sensation intact, DTR normal. Strength  5/5 in all 4.  Psychiatric: Normal judgment and insight. Alert and oriented x 3. Normal mood.    Subsequent Medicare wellness visit   1. Risk factors, based on past  M,S,F -cardiovascular disease risk factors include age, history of hypertension, history of hyperlipidemia   2.  Physical activities: Not very physically active   3.  Depression/mood:  Stable, not depressed   4.  Hearing:  No perceived issues   5.  ADL's: Independent in all ADLs   6.  Fall risk:  Low fall risk   7.  Home safety: No problems identified   8.  Height weight, and visual acuity: Height and weight as above, visual acuity is 20/20 with each eye independently and together   9.   Counseling:  Advised to increase exercise and healthy eating habits   10. Lab orders based on risk factors: Laboratory update will be reviewed   11. Referral :  None today   12. Care plan:  Follow-up with me in 1 year or sooner as needed   13. Cognitive assessment:  No cognitive impairment   14. Screening: Patient provided with a written and personalized 5-10 year screening schedule in the AVS.   yes   15. Provider List Update:   PCP only  16. Advance Directives: Full code     Office Visit from 10/02/2019 in [facility]  PHQ-9 Total Score 0      Fall Risk  10/02/2019 09/10/2018 01/16/2018 05/14/2017 07/19/2015  Falls in the past year? 0 1 0 Yes No  Number falls in past yr: 0 0 0 1 -  Comment missed a step - - - -  Injury with Fall? 0 1 0 Yes -  Comment - - - tripped over the dog and fell on [place]bo -  Risk for fall due to : - - - - -     Impression and Plan:  Encounter for preventive health examination -She has routine eye and dental care. -Immunizations are up-to-date. -Screening labs today. -Healthy lifestyle discussed in detail. -She had a colonoscopy in 2018 and is a 10-year callback. -She had a negative mammogram in December 2020. -She had a Pap smear in 2020.  MENOPAUSAL SYNDROME  - Plan: Estradiol (VAGIFEM) 10 MCG TABS vaginal tablet  Iatrogenic hypothyroidism -Followed by endocrinology.  Dyslipidemia  - Plan: Lipid panel -Last LDL was 100 in July 2020  Essential hypertension -Elevated blood pressure in office today, however with reported normal measurements at home. -She has been asked to do ambulatory measurements over the next 6 weeks and will discuss these values at that time.  GAD (generalized anxiety disorder)  - Plan: escitalopram (LEXAPRO) 10 MG tablet   Patient Instructions  -Nice seeing you today!!  -Lab work today; will notify you once results are available.  -Check BP at home 2-3 times a week and send data to me once you  have 6 weeks worth of measurements.  -Schedule follow up in 1 year or sooner as needed.   Preventive Care 52 Years and Older, Female Preventive care refers to lifestyle choices and visits with your health care provider that can promote health and wellness. This includes:  A yearly physical exam. This is also called an annual well check.  Regular dental and eye exams.  Immunizations.  Screening for certain conditions.  Healthy lifestyle choices, such as diet and exercise. What can I expect for my preventive care visit? Physical exam Your health care provider will check:  Height  and weight. These may be used to calculate body mass index (BMI), which is a measurement that tells if you are at a healthy weight.  Heart rate and blood pressure.  Your skin for abnormal spots. Counseling Your health care provider may ask you questions about:  Alcohol, tobacco, and drug use.  Emotional well-being.  Home and relationship well-being.  Sexual activity.  Eating habits.  History of falls.  Memory and ability to understand (cognition).  Work and work Statistician.  Pregnancy and menstrual history. What immunizations do I need?  Influenza (flu) vaccine  This is recommended every year. Tetanus, diphtheria, and pertussis (Tdap) vaccine  You may need a Td booster every 10 years. Varicella (chickenpox) vaccine  You may need this vaccine if you have not already been vaccinated. Zoster (shingles) vaccine  You may need this after age 21. Pneumococcal conjugate (PCV13) vaccine  One dose is recommended after age 42. Pneumococcal polysaccharide (PPSV23) vaccine  One dose is recommended after age 54. Measles, mumps, and rubella (MMR) vaccine  You may need at least one dose of MMR if you were born in 1957 or later. You may also need a second dose. Meningococcal conjugate (MenACWY) vaccine  You may need this if you have certain conditions. Hepatitis A vaccine  You may need  this if you have certain conditions or if you travel or work in places where you may be exposed to hepatitis A. Hepatitis B vaccine  You may need this if you have certain conditions or if you travel or work in places where you may be exposed to hepatitis B. Haemophilus influenzae type b (Hib) vaccine  You may need this if you have certain conditions. You may receive vaccines as individual doses or as more than one vaccine together in one shot (combination vaccines). Talk with your health care provider about the risks and benefits of combination vaccines. What tests do I need? Blood tests  Lipid and cholesterol levels. These may be checked every 5 years, or more frequently depending on your overall health.  Hepatitis C test.  Hepatitis B test. Screening  Lung cancer screening. You may have this screening every year starting at age 65 if you have a 30-pack-year history of smoking and currently smoke or have quit within the past 15 years.  Colorectal cancer screening. All adults should have this screening starting at age 60 and continuing until age 67. Your health care provider may recommend screening at age 31 if you are at increased risk. You will have tests every 1-10 years, depending on your results and the type of screening test.  Diabetes screening. This is done by checking your blood sugar (glucose) after you have not eaten for a while (fasting). You may have this done every 1-3 years.  Mammogram. This may be done every 1-2 years. Talk with your health care provider about how often you should have regular mammograms.  BRCA-related cancer screening. This may be done if you have a family history of breast, ovarian, tubal, or peritoneal cancers. Other tests  Sexually transmitted disease (STD) testing.  Bone density scan. This is done to screen for osteoporosis. You may have this done starting at age 56. Follow these instructions at home: Eating and drinking  Eat a diet that  includes fresh fruits and vegetables, whole grains, lean protein, and low-fat dairy products. Limit your intake of foods with high amounts of sugar, saturated fats, and salt.  Take vitamin and mineral supplements as recommended by your health care provider.  Do not drink alcohol if your health care provider tells you not to drink.  If you drink alcohol: ? Limit how much you have to 0-1 drink a day. ? Be aware of how much alcohol is in your drink. In the [place] one drink equals one 12 oz bottle of beer (355 mL), one 5 oz glass of wine (148 mL), or one 1 oz glass of hard liquor (44 mL). Lifestyle  Take daily care of your teeth and gums.  Stay active. Exercise for at least 30 minutes on 5 or more days each week.  Do not use any products that contain nicotine or tobacco, such as cigarettes, e-cigarettes, and chewing tobacco. If you need help quitting, ask your health care provider.  If you are sexually active, practice safe sex. Use a condom or other form of protection in order to prevent STIs (sexually transmitted infections).  Talk with your health care provider about taking a low-dose aspirin or statin. What's next?  Go to your health care provider once a year for a well check visit.  Ask your health care provider how often you should have your eyes and teeth checked.  Stay up to date on all vaccines. This information is not intended to replace advice given to you by your health care provider. Make sure you discuss any questions you have with your health care provider. Document Revised: 02/07/2018 Document Reviewed: 02/07/2018 Elsevier Patient Education  2020 [place], [place] [facility]

## 2019-10-03 ENCOUNTER — Other Ambulatory Visit (INDEPENDENT_AMBULATORY_CARE_PROVIDER_SITE_OTHER): Payer: PPO

## 2019-10-03 DIAGNOSIS — E785 Hyperlipidemia, unspecified: Secondary | ICD-10-CM | POA: Diagnosis not present

## 2019-10-03 DIAGNOSIS — Z0000 Encounter for general adult medical examination without abnormal findings: Secondary | ICD-10-CM | POA: Diagnosis not present

## 2019-10-03 DIAGNOSIS — I10 Essential (primary) hypertension: Secondary | ICD-10-CM

## 2019-10-04 LAB — COMPREHENSIVE METABOLIC PANEL
AG Ratio: 2 (calc) (ref 1.0–2.5)
ALT: 13 U/L (ref 6–29)
AST: 20 U/L (ref 10–35)
Albumin: 4.4 g/dL (ref 3.6–5.1)
Alkaline phosphatase (APISO): 69 U/L (ref 37–153)
BUN: 21 mg/dL (ref 7–25)
CO2: 24 mmol/L (ref 20–32)
Calcium: 9.4 mg/dL (ref 8.6–10.4)
Chloride: 103 mmol/L (ref 98–110)
Creat: 0.86 mg/dL (ref 0.50–0.99)
Globulin: 2.2 g/dL (calc) (ref 1.9–3.7)
Glucose, Bld: 80 mg/dL (ref 65–99)
Potassium: 4.5 mmol/L (ref 3.5–5.3)
Sodium: 140 mmol/L (ref 135–146)
Total Bilirubin: 0.6 mg/dL (ref 0.2–1.2)
Total Protein: 6.6 g/dL (ref 6.1–8.1)

## 2019-10-04 LAB — HEMOGLOBIN A1C
Hgb A1c MFr Bld: 5.2 % of total Hgb (ref <5.7)
Mean Plasma Glucose: 103 (calc)
eAG (mmol/L): 5.7 (calc)

## 2019-10-04 LAB — CBC WITH DIFFERENTIAL/PLATELET
Absolute Monocytes: 379 cells/uL (ref 200–950)
Basophils Absolute: 38 cells/uL (ref 0–200)
Basophils Relative: 0.8 %
Eosinophils Absolute: 58 cells/uL (ref 15–500)
Eosinophils Relative: 1.2 %
HCT: 38.4 % (ref 35.0–45.0)
Hemoglobin: 13.1 g/dL (ref 11.7–15.5)
Lymphs Abs: 1526 cells/uL (ref 850–3900)
MCH: 30.8 pg (ref 27.0–33.0)
MCHC: 34.1 g/dL (ref 32.0–36.0)
MCV: 90.1 fL (ref 80.0–100.0)
MPV: 12.5 fL (ref 7.5–12.5)
Monocytes Relative: 7.9 %
Neutro Abs: 2798 cells/uL (ref 1500–7800)
Neutrophils Relative %: 58.3 %
Platelets: 190 10*3/uL (ref 140–400)
RBC: 4.26 10*6/uL (ref 3.80–5.10)
RDW: 13.1 % (ref 11.0–15.0)
Total Lymphocyte: 31.8 %
WBC: 4.8 10*3/uL (ref 3.8–10.8)

## 2019-10-04 LAB — LIPID PANEL
Cholesterol: 216 mg/dL — ABNORMAL HIGH (ref <200)
HDL: 62 mg/dL (ref >=50)
LDL Cholesterol (Calc): 130 mg/dL (calc) — ABNORMAL HIGH
Non-HDL Cholesterol (Calc): 154 mg/dL (calc) — ABNORMAL HIGH (ref <130)
Total CHOL/HDL Ratio: 3.5 (calc) (ref <5.0)
Triglycerides: 129 mg/dL (ref <150)

## 2019-10-04 LAB — VITAMIN D 25 HYDROXY (VIT D DEFICIENCY, FRACTURES): Vit D, 25-Hydroxy: 34 ng/mL (ref 30–100)

## 2019-10-04 LAB — VITAMIN B12: Vitamin B-12: 388 pg/mL (ref 200–1100)

## 2019-10-09 DIAGNOSIS — M75102 Unspecified rotator cuff tear or rupture of left shoulder, not specified as traumatic: Secondary | ICD-10-CM | POA: Diagnosis not present

## 2019-10-09 DIAGNOSIS — M25512 Pain in left shoulder: Secondary | ICD-10-CM | POA: Diagnosis not present

## 2019-10-27 ENCOUNTER — Other Ambulatory Visit: Payer: Self-pay | Admitting: Internal Medicine

## 2019-10-27 DIAGNOSIS — M25512 Pain in left shoulder: Secondary | ICD-10-CM | POA: Diagnosis not present

## 2019-10-27 DIAGNOSIS — M25612 Stiffness of left shoulder, not elsewhere classified: Secondary | ICD-10-CM | POA: Insufficient documentation

## 2019-10-27 DIAGNOSIS — F419 Anxiety disorder, unspecified: Secondary | ICD-10-CM

## 2019-11-19 DIAGNOSIS — H43812 Vitreous degeneration, left eye: Secondary | ICD-10-CM | POA: Diagnosis not present

## 2019-11-19 DIAGNOSIS — Z961 Presence of intraocular lens: Secondary | ICD-10-CM | POA: Diagnosis not present

## 2019-11-19 DIAGNOSIS — H35371 Puckering of macula, right eye: Secondary | ICD-10-CM | POA: Diagnosis not present

## 2019-11-19 DIAGNOSIS — H04123 Dry eye syndrome of bilateral lacrimal glands: Secondary | ICD-10-CM | POA: Diagnosis not present

## 2020-01-09 IMAGING — DX RIGHT HAND - 2 VIEW
2 series · 2 of 2 positions shown · non-contrast
Comparison: None.

CLINICAL DATA: Fall

EXAM:
RIGHT HAND - 2 VIEW

[hand pa]
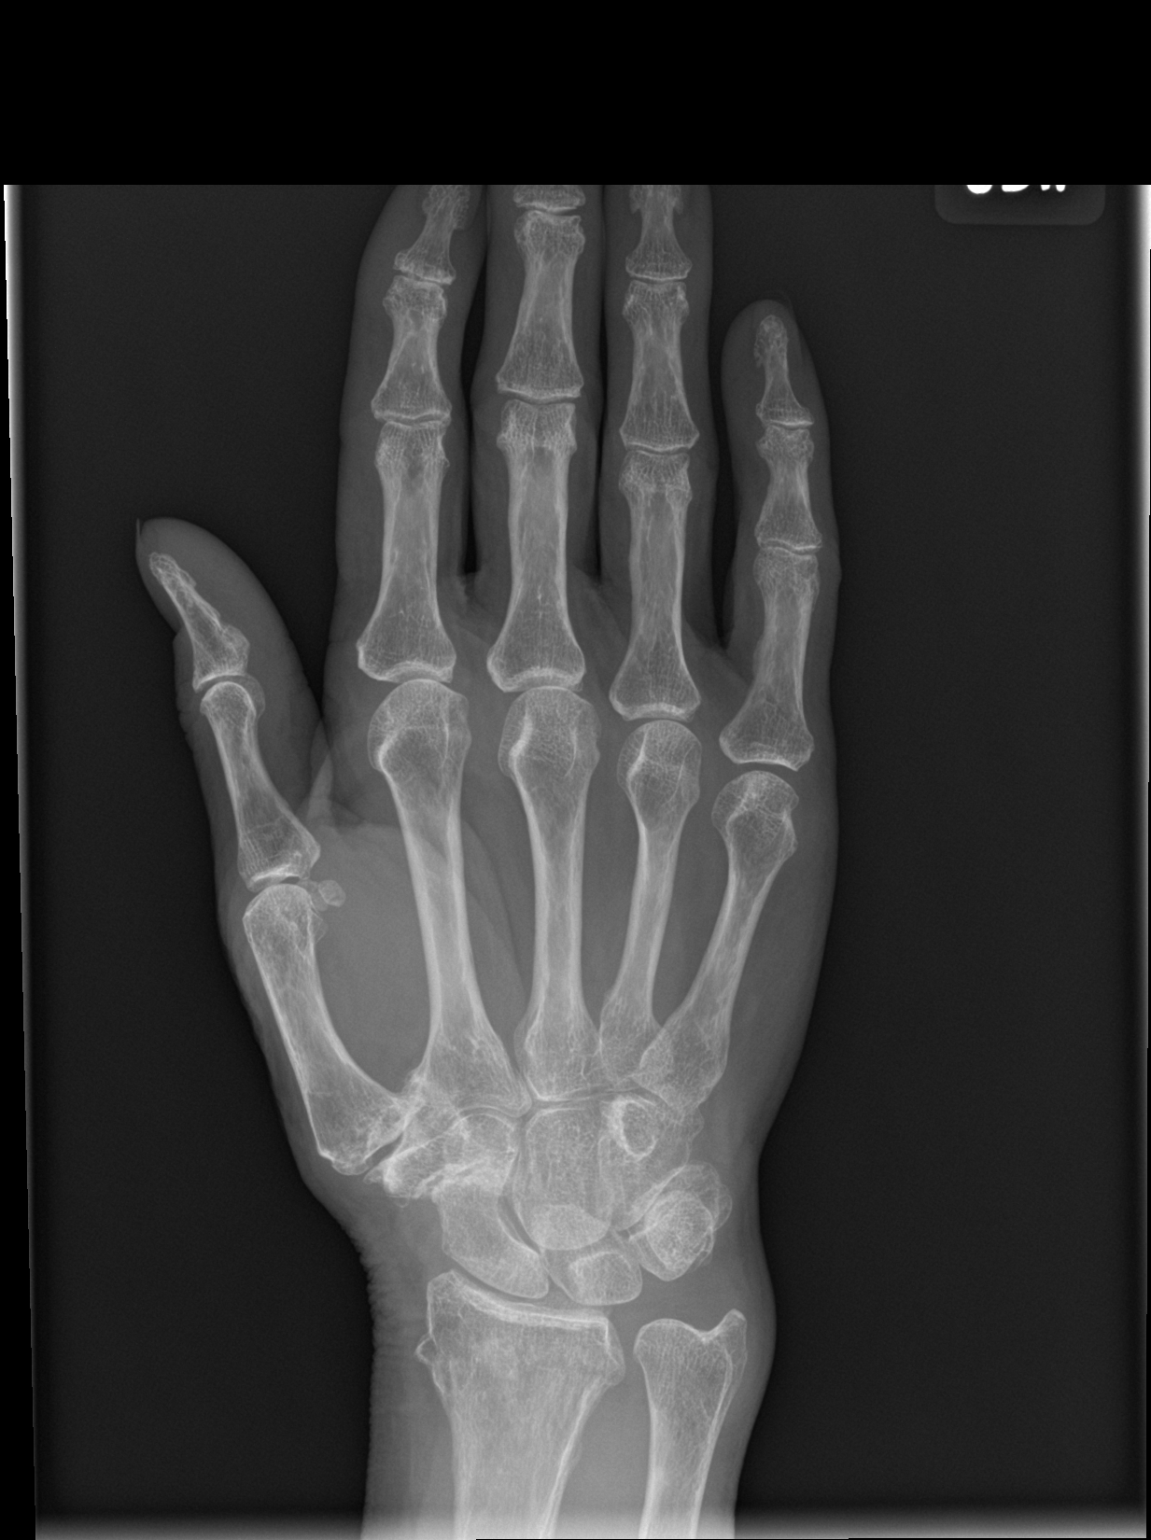

[hand lat]
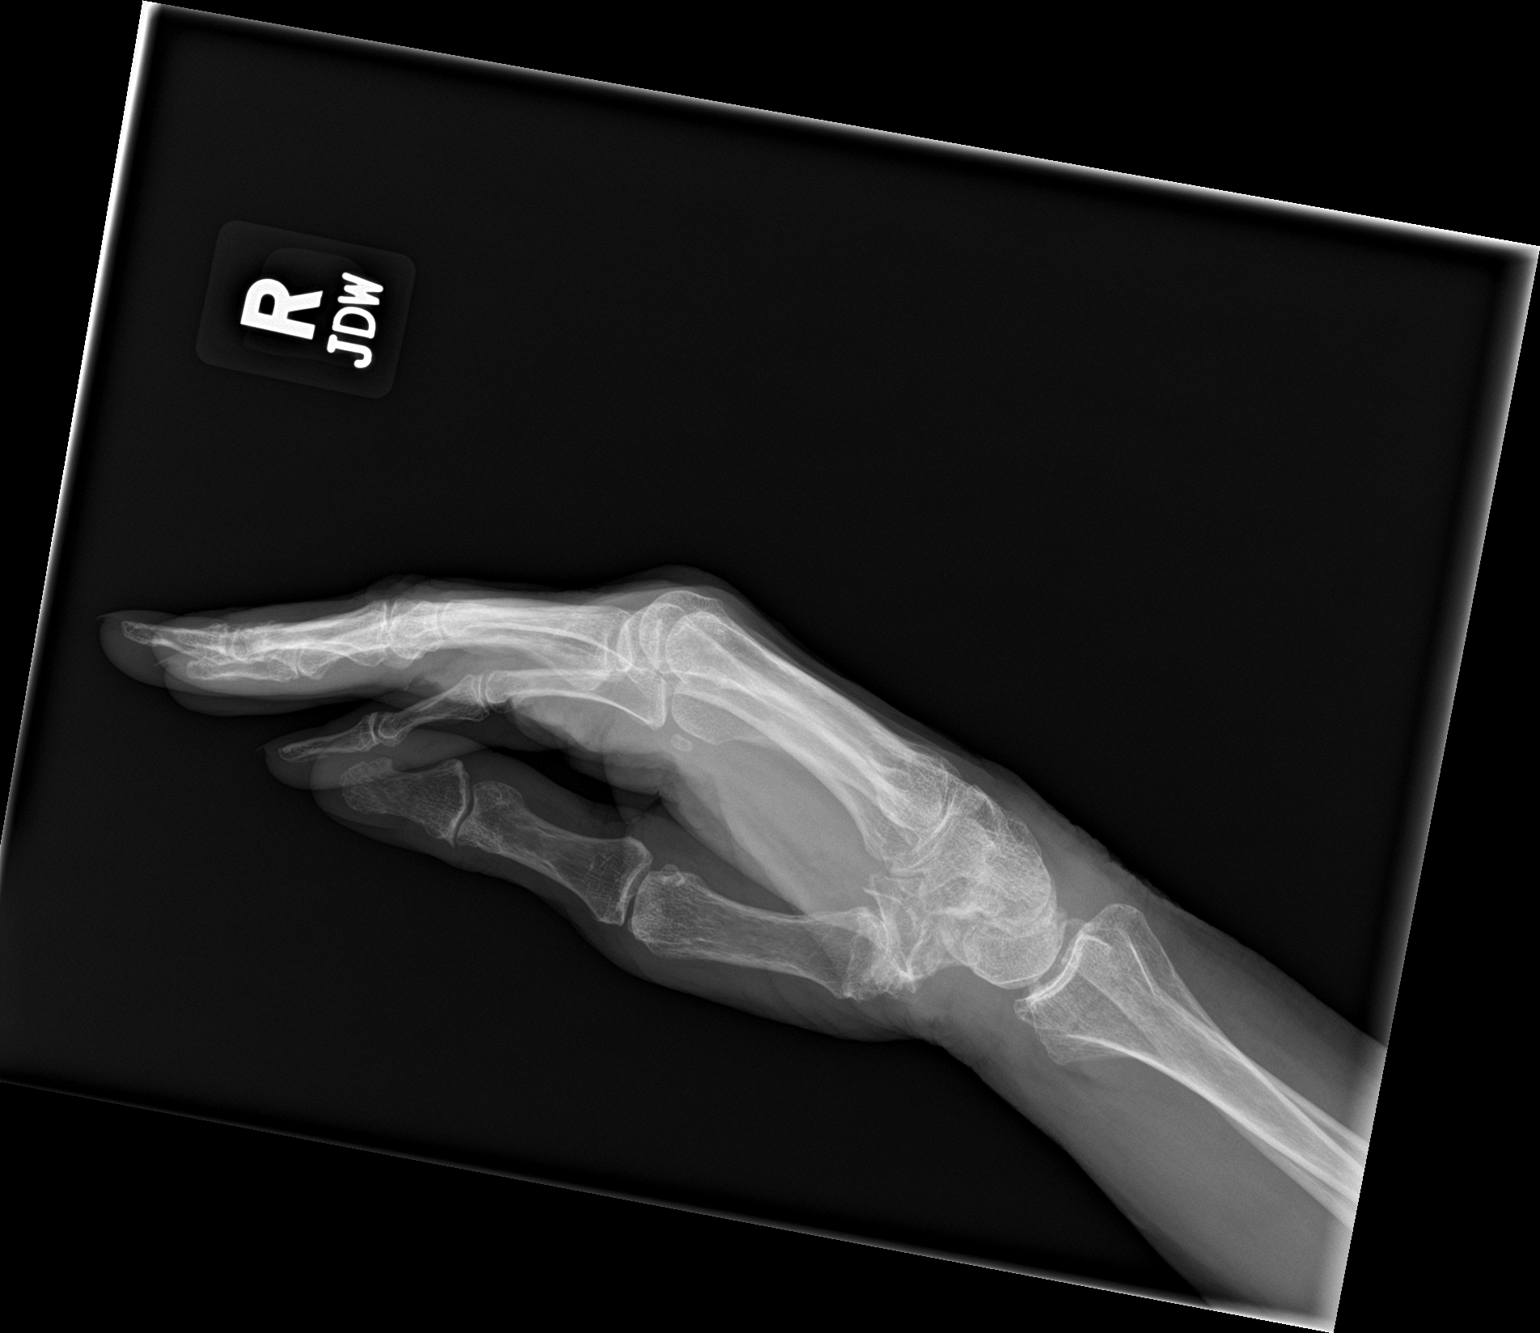

[2 of 2 positions shown; findings below may reference images not displayed]

FINDINGS: Transverse impacted fracture distal radius without significant
displacement. No fracture of the ulna.

Negative for hand fracture. Mild degenerative change in the second
and third DIP joints. Degenerative change base of thumb.
IMPRESSION: Transverse nondisplaced fracture distal radius

## 2020-01-09 IMAGING — DX LUMBAR SPINE - COMPLETE 4+ VIEW
5 series · 5 of 5 positions shown · non-contrast
Comparison: None.

CLINICAL DATA: Fall

EXAM:
LUMBAR SPINE - COMPLETE 4+ VIEW

[l-spine ap]
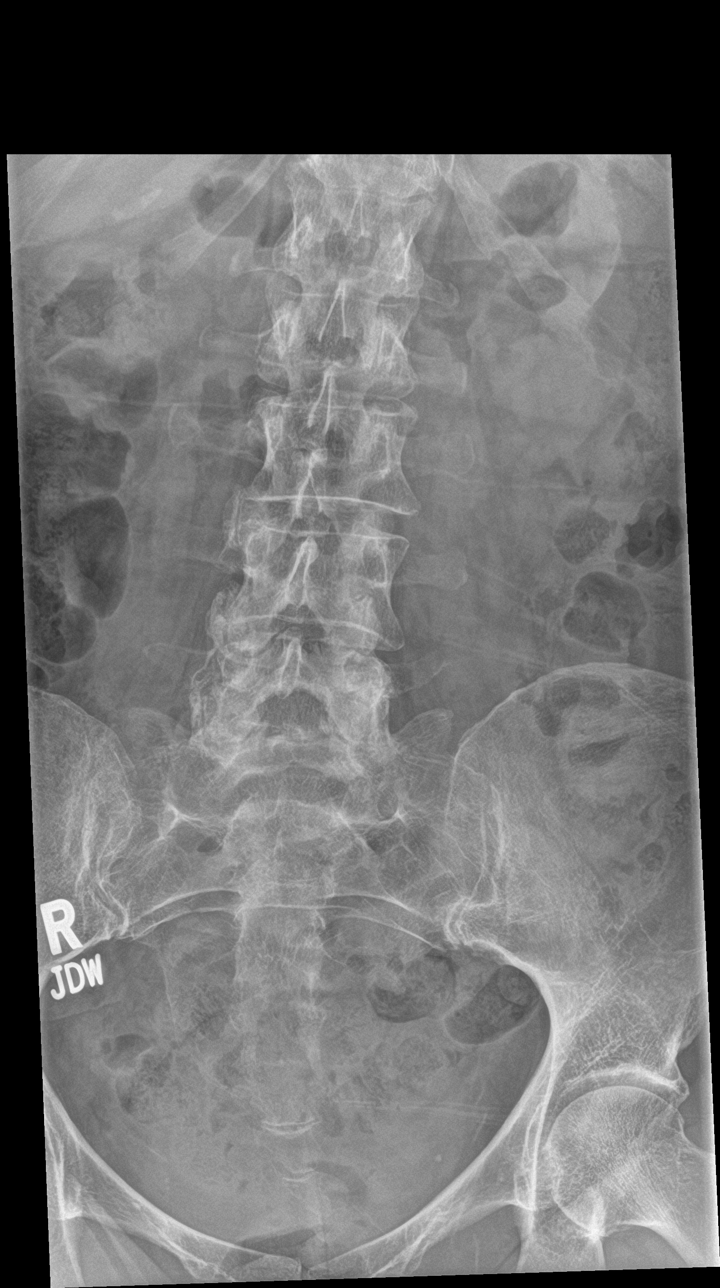

[l-spine obl (1 of 2)]
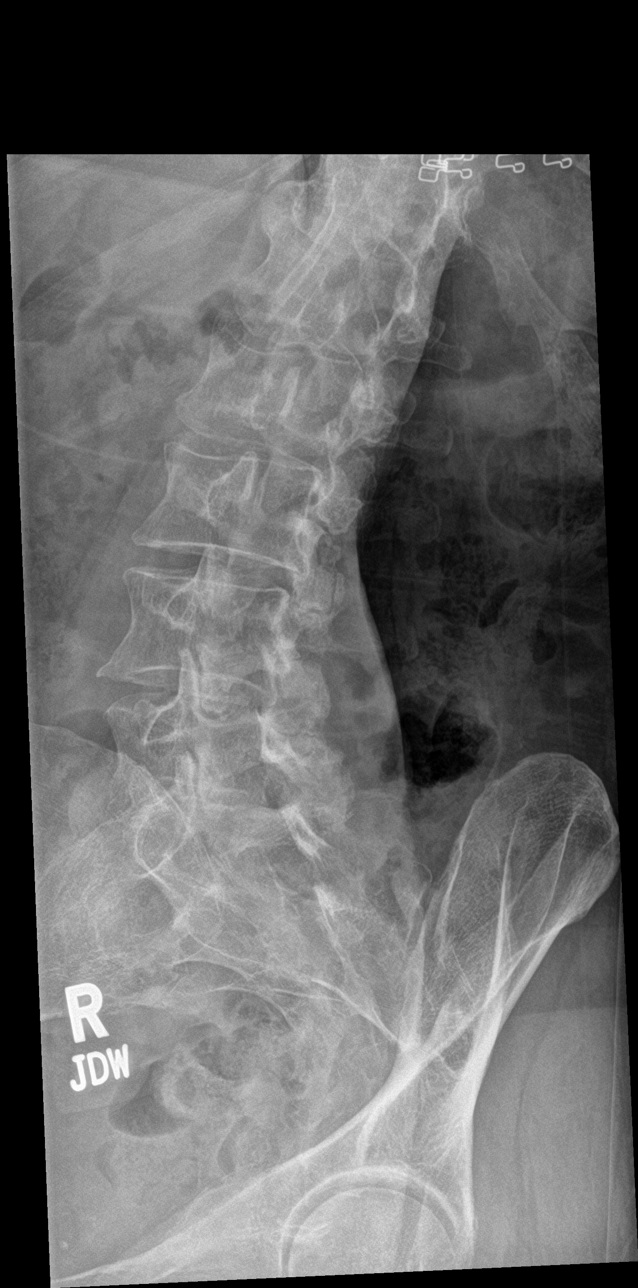

[l-spine obl (2 of 2)]
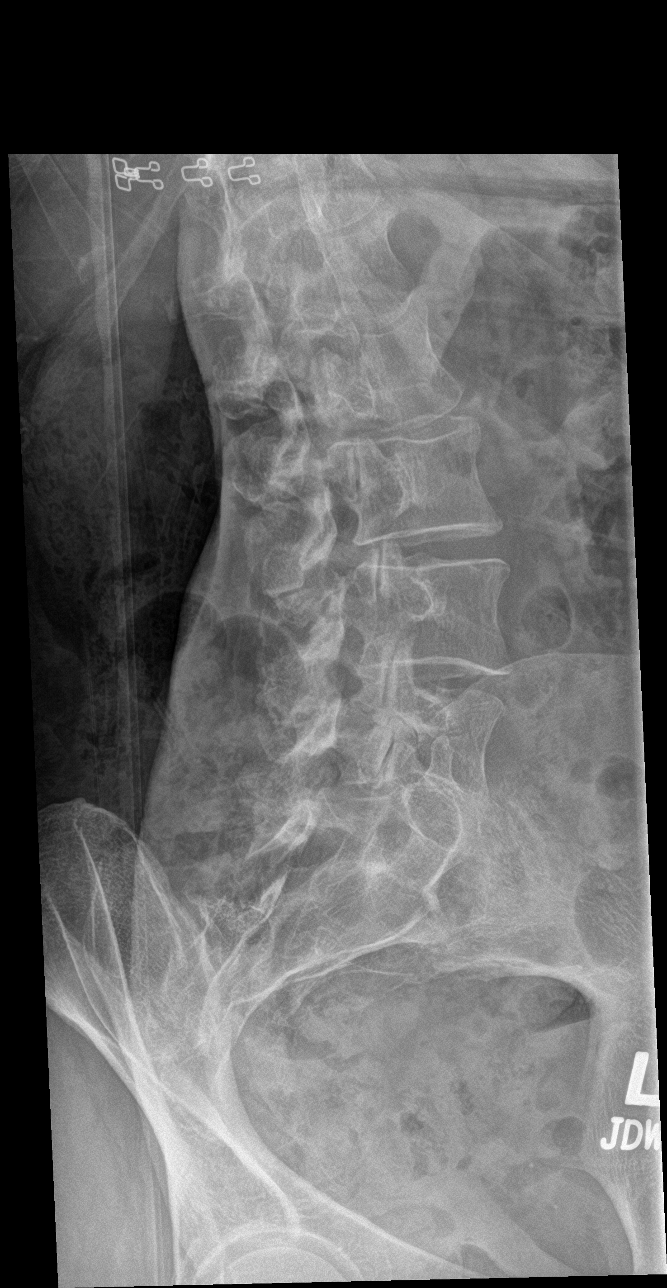

[l-spine lat]
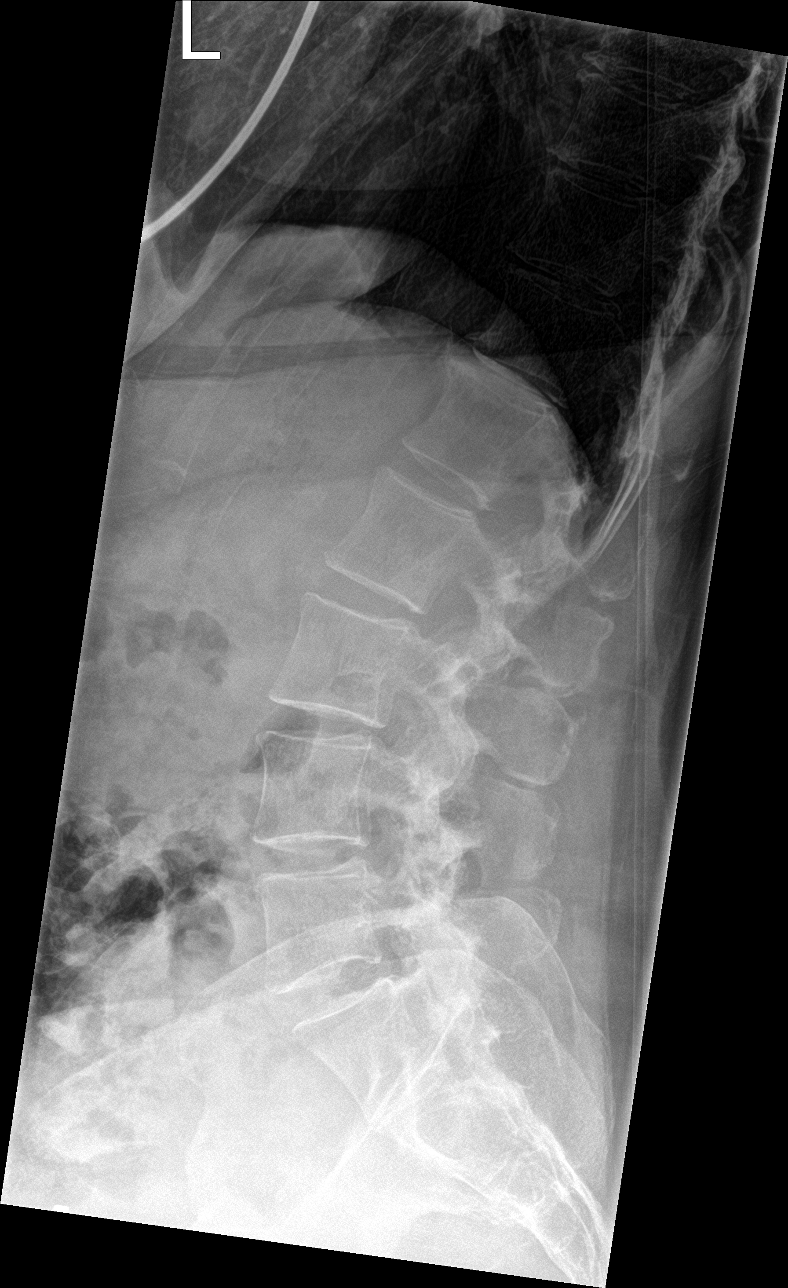

[l-spine spot]
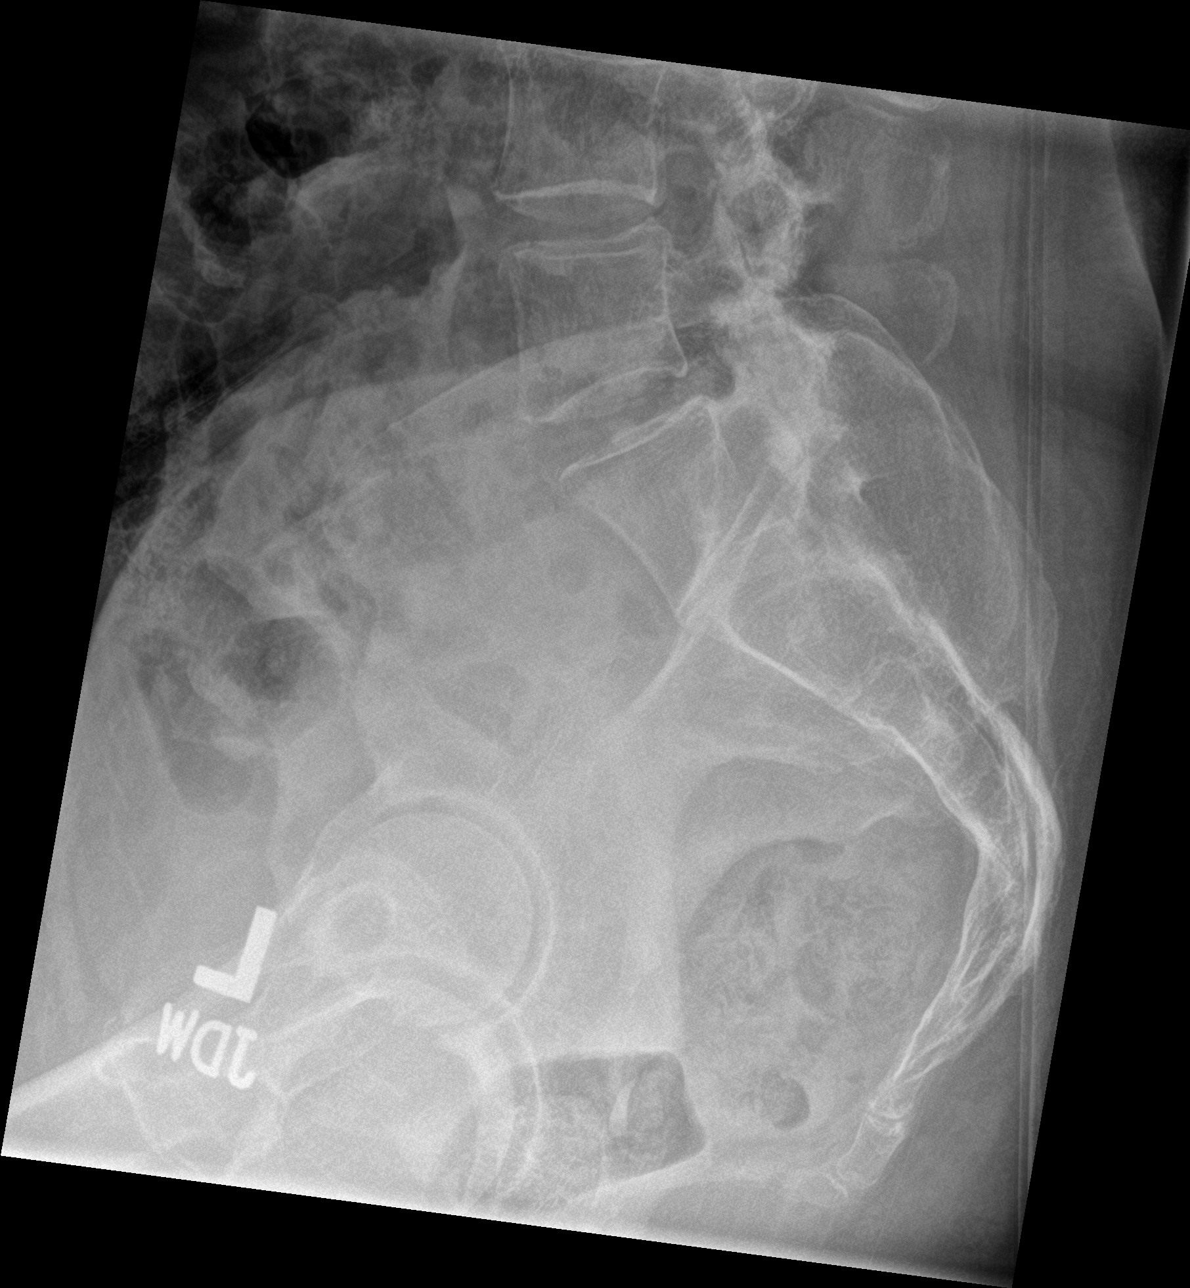

[5 of 5 positions shown; findings below may reference images not displayed]

FINDINGS: There is no evidence of lumbar spine fracture. Alignment is normal.
Intervertebral disc spaces are maintained. Mild facet degeneration
L4-5 and L5-S1 bilaterally.
IMPRESSION: Negative for fracture

## 2020-01-22 ENCOUNTER — Other Ambulatory Visit: Payer: Self-pay | Admitting: Internal Medicine

## 2020-01-22 DIAGNOSIS — F320 Major depressive disorder, single episode, mild: Secondary | ICD-10-CM

## 2020-02-03 ENCOUNTER — Other Ambulatory Visit: Payer: Self-pay | Admitting: Internal Medicine

## 2020-02-03 DIAGNOSIS — F419 Anxiety disorder, unspecified: Secondary | ICD-10-CM

## 2020-05-03 ENCOUNTER — Other Ambulatory Visit: Payer: Self-pay | Admitting: Internal Medicine

## 2020-05-03 DIAGNOSIS — F411 Generalized anxiety disorder: Secondary | ICD-10-CM

## 2020-05-10 ENCOUNTER — Other Ambulatory Visit: Payer: Self-pay | Admitting: Internal Medicine

## 2020-05-10 DIAGNOSIS — F419 Anxiety disorder, unspecified: Secondary | ICD-10-CM

## 2020-05-13 ENCOUNTER — Telehealth (INDEPENDENT_AMBULATORY_CARE_PROVIDER_SITE_OTHER): Payer: PPO | Admitting: Internal Medicine

## 2020-05-13 ENCOUNTER — Encounter: Payer: Self-pay | Admitting: Internal Medicine

## 2020-05-13 VITALS — BP 161/103 | Wt 133.0 lb

## 2020-05-13 DIAGNOSIS — I10 Essential (primary) hypertension: Secondary | ICD-10-CM | POA: Diagnosis not present

## 2020-05-13 MED ORDER — LISINOPRIL 10 MG PO TABS: 10.0000 mg | ORAL_TABLET | Freq: Every day | ORAL | 1 refills | Status: DC

## 2020-05-13 NOTE — Progress Notes (Signed)
Virtual Visit via Video Note  I connected with Cassandra Mcfarland on 05/13/20 at  1:00 PM EDT by a video enabled telemedicine application and verified that I am speaking with the correct person using two identifiers.  Location patient: home Location provider: work office Persons participating in the virtual visit: patient, provider  I discussed the limitations of evaluation and management by telemedicine and the availability of in person appointments. The patient expressed understanding and agreed to proceed.   HPI: She has a history of hypertension.  She has been taken off medication over 3 years ago as she had become hypotensive and had not had issues with it since.  Lately she has noticed elevated blood pressures.  3 readings from today: 161/103, 142/107, 173/95.  She had an old bottle of lisinopril 10 mg from 2017 and she took 1 this morning.  She is wondering if she should go back on it.  She has not had headaches, chest pain.  Unfortunately her sister passed away a couple weeks ago.   ROS: Constitutional: Denies fever, chills, diaphoresis, appetite change and fatigue.  HEENT: Denies photophobia, eye pain, redness, hearing loss, ear pain, congestion, sore throat, rhinorrhea, sneezing, mouth sores, trouble swallowing, neck pain, neck stiffness and tinnitus.   Respiratory: Denies SOB, DOE, cough, chest tightness,  and wheezing.   Cardiovascular: Denies chest pain, palpitations and leg swelling.  Gastrointestinal: Denies nausea, vomiting, abdominal pain, diarrhea, constipation, blood in stool and abdominal distention.  Genitourinary: Denies dysuria, urgency, frequency, hematuria, flank pain and difficulty urinating.  Endocrine: Denies: hot or cold intolerance, sweats, changes in hair or nails, polyuria, polydipsia. Musculoskeletal: Denies myalgias, back pain, joint swelling, arthralgias and gait problem.  Skin: Denies pallor, rash and wound.  Neurological: Denies dizziness, seizures,  syncope, weakness, light-headedness, numbness and headaches.  Hematological: Denies adenopathy. Easy bruising, personal or family bleeding history  Psychiatric/Behavioral: Denies suicidal ideation, mood changes, confusion, nervousness, sleep disturbance and agitation   Past Medical History:  Diagnosis Date  . Anemia    after knee surgery ( only time)  . Anxiety   . Complication of anesthesia   . Depression   . History of urinary tract infection   . Hypertension    no longer on medications  since knee surgery and pain resolved  . Osteoarthritis   . PONV (postoperative nausea and vomiting)   . Situational anxiety 09/2003  . Situational depression   . Thyroid disease    hyper parathyroidism    Past Surgical History:  Procedure Laterality Date  . abddominoplasty  1995  . APPENDECTOMY  Age 65  . BREAST REDUCTION SURGERY  09/2001   & Lift  . COLONOSCOPY    . EYE SURGERY     cataract bil  . ORIF ELBOW FRACTURE Left 03/15/2017   Procedure: OPEN REDUCTION INTERNAL FIXATION (ORIF) ELBOW/OLECRANON FRACTURE;  Surgeon: Altamese Hutchinson, MD;  Location: [facility];  Service: Orthopedics;  Laterality: Left;  . PARATHYROIDECTOMY  5/1/112   due to hypercalcemia  . SHOULDER SURGERY     left rotator cuff; bicep tendon repair on left   . THUMB FUSION  08/2007   with bone graft- post trauma Dr. Loney Loh  . THYROIDECTOMY N/A 10/23/2017   Procedure: TOTAL THYROIDECTOMY;  Surgeon: Armandina Gemma, MD;  Location: [facility];  Service: General;  Laterality: N/A;  . toatal knee replacement Right 2001  . TOTAL KNEE ARTHROPLASTY Left 08/02/2015   Procedure: LEFT TOTAL KNEE ARTHROPLASTY;  Surgeon: Gaynelle Arabian, MD;  Location: [facility];  Service: Orthopedics;  Laterality: Left;  . TOTAL THYROIDECTOMY     [facility]  . TUBAL LIGATION  1994    Family History  Problem Relation Age of Onset  . Coronary artery disease Father   . Heart disease Father   . Heart attack Father   . Coronary artery disease Brother   .  Hypertension Brother   . Cancer Mother   . Leukemia Mother 42  . Osteoporosis Mother   . Hypertension Sister   . Osteoporosis Sister   . Hypertension Brother   . Osteoporosis Maternal Aunt     SOCIAL HX:   reports that she has never smoked. She has never used smokeless tobacco. She reports current alcohol use. She reports that she does not use drugs.   Current Outpatient Medications:  .  ALPRAZolam (XANAX) 0.5 MG tablet, TAKE 1 TABLET BY MOUTH AT BEDTIME AS NEEDED, Disp: 90 tablet, Rfl: 0 .  Biotin 10 MG CAPS, Take by mouth., Disp: , Rfl:  .  escitalopram (LEXAPRO) 10 MG tablet, TAKE 1 TABLET(10 MG) BY MOUTH DAILY, Disp: 90 tablet, Rfl: 1 .  Estradiol (VAGIFEM) 10 MCG TABS vaginal tablet, Place 1 tablet (10 mcg total) vaginally 2 (two) times a week. On Sunday and Thursday, Disp: 18 tablet, Rfl: 11 .  ezetimibe (ZETIA) 10 MG tablet, TAKE 1 TABLET(10 MG) BY MOUTH DAILY, Disp: 90 tablet, Rfl: 0 .  levothyroxine (SYNTHROID) 88 MCG tablet, levothyroxine 88 mcg tablet, Disp: , Rfl:  .  lisinopril (ZESTRIL) 10 MG tablet, Take 1 tablet (10 mg total) by mouth daily., Disp: 90 tablet, Rfl: 1 .  Multiple Vitamin (MULTIVITAMIN ADULT PO), Take by mouth., Disp: , Rfl:  .  Multiple Vitamins-Minerals (PRESERVISION AREDS PO), Take by mouth., Disp: , Rfl:   Current Facility-Administered Medications:  .  ipratropium-albuterol (DUONEB) 0.5-2.5 (3) MG/3ML nebulizer solution 3 mL, 3 mL, Nebulization, Once, Nafziger, Tommi Rumps, NP  EXAM:   VITALS per patient if applicable: Blood pressure 173/95  GENERAL: alert, oriented, appears well and in no acute distress  HEENT: atraumatic, conjunttiva clear, no obvious abnormalities on inspection of external nose and ears  NECK: normal movements of the head and neck  LUNGS: on inspection no signs of respiratory distress, breathing rate appears normal, no obvious gross increased work of breathing, gasping or wheezing  CV: no obvious cyanosis  MS: moves all visible  extremities without noticeable abnormality  PSYCH/NEURO: pleasant and cooperative, no obvious depression or anxiety, speech and thought processing grossly intact  ASSESSMENT AND PLAN:   Primary hypertension  -Given current blood pressures I agree with restarting lisinopril 10 mg. -She will return in 6 to 8 weeks for follow-up.     I discussed the assessment and treatment plan with the patient. The patient was provided an opportunity to ask questions and all were answered. The patient agreed with the plan and demonstrated an understanding of the instructions.   The patient was advised to call back or seek an in-person evaluation if the symptoms worsen or if the condition fails to improve as anticipated.    Lelon Frohlich, MD  [facility]

## 2020-05-14 DIAGNOSIS — E890 Postprocedural hypothyroidism: Secondary | ICD-10-CM | POA: Diagnosis not present

## 2020-05-19 DIAGNOSIS — E213 Hyperparathyroidism, unspecified: Secondary | ICD-10-CM | POA: Diagnosis not present

## 2020-05-19 DIAGNOSIS — E890 Postprocedural hypothyroidism: Secondary | ICD-10-CM | POA: Diagnosis not present

## 2020-06-18 ENCOUNTER — Other Ambulatory Visit: Payer: Self-pay | Admitting: Internal Medicine

## 2020-07-20 DIAGNOSIS — E890 Postprocedural hypothyroidism: Secondary | ICD-10-CM | POA: Diagnosis not present

## 2020-09-08 ENCOUNTER — Other Ambulatory Visit: Payer: Self-pay | Admitting: *Deleted

## 2020-09-08 DIAGNOSIS — F419 Anxiety disorder, unspecified: Secondary | ICD-10-CM

## 2020-09-08 MED ORDER — ALPRAZOLAM 0.5 MG PO TABS: 0.5000 mg | ORAL_TABLET | Freq: Every evening | ORAL | 0 refills | Status: DC | PRN

## 2020-09-08 NOTE — Telephone Encounter (Signed)
[facility] [address] Refill request ALPRAZolam Duanne Moron) 0.5 MG tablet  Last refill 05/11/20 Last office visit 05/13/20

## 2020-09-22 DIAGNOSIS — D2271 Melanocytic nevi of right lower limb, including hip: Secondary | ICD-10-CM | POA: Diagnosis not present

## 2020-09-22 DIAGNOSIS — L814 Other melanin hyperpigmentation: Secondary | ICD-10-CM | POA: Diagnosis not present

## 2020-09-22 DIAGNOSIS — L570 Actinic keratosis: Secondary | ICD-10-CM | POA: Diagnosis not present

## 2020-09-22 DIAGNOSIS — L820 Inflamed seborrheic keratosis: Secondary | ICD-10-CM | POA: Diagnosis not present

## 2020-09-22 DIAGNOSIS — D2261 Melanocytic nevi of right upper limb, including shoulder: Secondary | ICD-10-CM | POA: Diagnosis not present

## 2020-10-05 ENCOUNTER — Other Ambulatory Visit: Payer: Self-pay

## 2020-10-05 ENCOUNTER — Ambulatory Visit (INDEPENDENT_AMBULATORY_CARE_PROVIDER_SITE_OTHER): Payer: PPO | Admitting: Internal Medicine

## 2020-10-05 ENCOUNTER — Encounter: Payer: Self-pay | Admitting: Internal Medicine

## 2020-10-05 VITALS — BP 110/80 | HR 58 | Temp 97.4°F | Ht 64.5 in | Wt 131.2 lb

## 2020-10-05 DIAGNOSIS — E785 Hyperlipidemia, unspecified: Secondary | ICD-10-CM

## 2020-10-05 DIAGNOSIS — F320 Major depressive disorder, single episode, mild: Secondary | ICD-10-CM

## 2020-10-05 DIAGNOSIS — E042 Nontoxic multinodular goiter: Secondary | ICD-10-CM | POA: Diagnosis not present

## 2020-10-05 DIAGNOSIS — I10 Essential (primary) hypertension: Secondary | ICD-10-CM | POA: Diagnosis not present

## 2020-10-05 DIAGNOSIS — N951 Menopausal and female climacteric states: Secondary | ICD-10-CM

## 2020-10-05 DIAGNOSIS — Z780 Asymptomatic menopausal state: Secondary | ICD-10-CM

## 2020-10-05 DIAGNOSIS — E032 Hypothyroidism due to medicaments and other exogenous substances: Secondary | ICD-10-CM | POA: Diagnosis not present

## 2020-10-05 DIAGNOSIS — Z13820 Encounter for screening for osteoporosis: Secondary | ICD-10-CM

## 2020-10-05 DIAGNOSIS — Z0000 Encounter for general adult medical examination without abnormal findings: Secondary | ICD-10-CM

## 2020-10-05 LAB — HEMOGLOBIN A1C: Hgb A1c MFr Bld: 5.7 % (ref 4.6–6.5)

## 2020-10-05 LAB — CBC WITH DIFFERENTIAL/PLATELET
Basophils Absolute: 0 10*3/uL (ref 0.0–0.1)
Basophils Relative: 0.5 % (ref 0.0–3.0)
Eosinophils Absolute: 0 10*3/uL (ref 0.0–0.7)
Eosinophils Relative: 0.8 % (ref 0.0–5.0)
HCT: 38.6 % (ref 36.0–46.0)
Hemoglobin: 12.9 g/dL (ref 12.0–15.0)
Lymphocytes Relative: 30 % (ref 12.0–46.0)
Lymphs Abs: 1.7 10*3/uL (ref 0.7–4.0)
MCHC: 33.5 g/dL (ref 30.0–36.0)
MCV: 90.6 fl (ref 78.0–100.0)
Monocytes Absolute: 0.5 10*3/uL (ref 0.1–1.0)
Monocytes Relative: 8.5 % (ref 3.0–12.0)
Neutro Abs: 3.4 10*3/uL (ref 1.4–7.7)
Neutrophils Relative %: 60.2 % (ref 43.0–77.0)
Platelets: 197 10*3/uL (ref 150.0–400.0)
RBC: 4.26 Mil/uL (ref 3.87–5.11)
RDW: 13.4 % (ref 11.5–15.5)
WBC: 5.7 10*3/uL (ref 4.0–10.5)

## 2020-10-05 LAB — LIPID PANEL
Cholesterol: 217 mg/dL — ABNORMAL HIGH (ref 0–200)
HDL: 63.5 mg/dL (ref >39.00)
LDL Cholesterol: 130 mg/dL — ABNORMAL HIGH (ref 0–99)
NonHDL: 153.13
Total CHOL/HDL Ratio: 3
Triglycerides: 118 mg/dL (ref 0.0–149.0)
VLDL: 23.6 mg/dL (ref 0.0–40.0)

## 2020-10-05 LAB — COMPREHENSIVE METABOLIC PANEL
ALT: 14 U/L (ref 0–35)
AST: 20 U/L (ref 0–37)
Albumin: 4.3 g/dL (ref 3.5–5.2)
Alkaline Phosphatase: 72 U/L (ref 39–117)
BUN: 14 mg/dL (ref 6–23)
CO2: 24 mEq/L (ref 19–32)
Calcium: 9.5 mg/dL (ref 8.4–10.5)
Chloride: 102 mEq/L (ref 96–112)
Creatinine, Ser: 0.84 mg/dL (ref 0.40–1.20)
GFR: 71.11 mL/min (ref >60.00)
Glucose, Bld: 85 mg/dL (ref 70–99)
Potassium: 4 mEq/L (ref 3.5–5.1)
Sodium: 138 mEq/L (ref 135–145)
Total Bilirubin: 0.8 mg/dL (ref 0.2–1.2)
Total Protein: 6.6 g/dL (ref 6.0–8.3)

## 2020-10-05 LAB — VITAMIN D 25 HYDROXY (VIT D DEFICIENCY, FRACTURES): VITD: 61.07 ng/mL (ref 30.00–100.00)

## 2020-10-05 LAB — VITAMIN B12: Vitamin B-12: 283 pg/mL (ref 211–911)

## 2020-10-05 MED ORDER — ESTRADIOL 10 MCG VA TABS: 10.0000 ug | ORAL_TABLET | VAGINAL | 11 refills | Status: DC

## 2020-10-05 NOTE — Progress Notes (Signed)
Established Patient Office Visit     This visit occurred during the SARS-CoV-2 public health emergency.  Safety protocols were in place, including screening questions prior to the visit, additional usage of staff PPE, and extensive cleaning of exam room while observing appropriate contact time as indicated for disinfecting solutions.    CC/Reason for Visit: Annual preventive exam and subsequent Medicare wellness visit  HPI: Cassandra Mcfarland is a 70 y.o. female who is coming in today for the above mentioned reasons. Past Medical History is significant for: Multinodular goiter with clinical hyperthyroidism who recently underwent total thyroidectomy in August 2019.  Now hypothyroid on Synthroid.  Followed by Dr. Chalmers Cater with endocrinology.  History of hypertension for which she takes lisinopril 10 mg daily, hyperlipidemia.   She takes alprazolam 0.5 mg at bedtime for insomnia as needed.  She is also on Lexapro for depression.  She feels like she is doing well and has no concerns today.  She has routine eye and dental care.  No perceived hearing issues.  She walks 1 to 3 miles a day.  She is due for her second COVID booster, shingles vaccine.  She had a colonoscopy in 2018.  She is due this year for DEXA scan.  The last mammogram I have for her chart is from December 2020, she states she had 1 last year and will obtain copies for me.  She no longer pursues Pap smears.  Past Medical/Surgical History: Past Medical History:  Diagnosis Date   Anemia    after knee surgery ( only time)   Anxiety    Complication of anesthesia    Depression    History of urinary tract infection    Hypertension    no longer on medications  since knee surgery and pain resolved   Osteoarthritis    PONV (postoperative nausea and vomiting)    Situational anxiety 09/2003   Situational depression    Thyroid disease    hyper parathyroidism    Past Surgical History:  Procedure Laterality Date   abddominoplasty  1995    APPENDECTOMY  Age 54   BREAST REDUCTION SURGERY  09/2001   & Lift   COLONOSCOPY     EYE SURGERY     cataract bil   ORIF ELBOW FRACTURE Left 03/15/2017   Procedure: OPEN REDUCTION INTERNAL FIXATION (ORIF) ELBOW/OLECRANON FRACTURE;  Surgeon: Altamese Brooktrails, MD;  Location: [facility];  Service: Orthopedics;  Laterality: Left;   PARATHYROIDECTOMY  5/1/112   due to hypercalcemia   SHOULDER SURGERY     left rotator cuff; bicep tendon repair on left    THUMB FUSION  08/2007   with bone graft- post trauma Dr. Loney Loh   THYROIDECTOMY N/A 10/23/2017   Procedure: TOTAL THYROIDECTOMY;  Surgeon: Armandina Gemma, MD;  Location: [facility];  Service: General;  Laterality: N/A;   toatal knee replacement Right 2001   TOTAL KNEE ARTHROPLASTY Left 08/02/2015   Procedure: LEFT TOTAL KNEE ARTHROPLASTY;  Surgeon: Gaynelle Arabian, MD;  Location: [facility];  Service: Orthopedics;  Laterality: Left;   TOTAL THYROIDECTOMY     [facility]   TUBAL LIGATION  1994    Social History:  reports that she has never smoked. She has never used smokeless tobacco. She reports current alcohol use. She reports that she does not use drugs.  Allergies: Allergies  Allergen Reactions   Ciprofloxacin Dermatitis, Other (See Comments) and Rash    Severe burning, blisters on toes   Tetanus-Diphtheria Toxoids Td Swelling  SWELLING REACTION UNSPECIFIED  [LOCAL ? Vs SYSTEMIC ?]   Codeine Nausea And Vomiting   Methimazole Itching and Rash    Blisters    Family History:  Family History  Problem Relation Age of Onset   Coronary artery disease Father    Heart disease Father    Heart attack Father    Coronary artery disease Brother    Hypertension Brother    Cancer Mother    Leukemia Mother 34   Osteoporosis Mother    Hypertension Sister    Osteoporosis Sister    Hypertension Brother    Osteoporosis Maternal Aunt      Current Outpatient Medications:    ALPRAZolam (XANAX) 0.5 MG tablet, Take 1 tablet (0.5 mg total) by mouth at bedtime  as needed., Disp: 90 tablet, Rfl: 0   Cholecalciferol (VITAMIN D) 50 MCG (2000 UT) tablet, Take 2,000 Units by mouth daily., Disp: , Rfl:    escitalopram (LEXAPRO) 10 MG tablet, TAKE 1 TABLET(10 MG) BY MOUTH DAILY, Disp: 90 tablet, Rfl: 1   levothyroxine (SYNTHROID) 88 MCG tablet, levothyroxine 88 mcg tablet, Disp: , Rfl:    lisinopril (ZESTRIL) 10 MG tablet, Take 1 tablet (10 mg total) by mouth daily., Disp: 90 tablet, Rfl: 1   Multiple Vitamin (MULTIVITAMIN ADULT PO), Take by mouth., Disp: , Rfl:    [START ON 10/07/2020] Estradiol (VAGIFEM) 10 MCG TABS vaginal tablet, Place 1 tablet (10 mcg total) vaginally 2 (two) times a week. On Sunday and Thursday, Disp: 18 tablet, Rfl: 11   ezetimibe (ZETIA) 10 MG tablet, TAKE 1 TABLET(10 MG) BY MOUTH DAILY (Patient not taking: Reported on 10/05/2020), Disp: 90 tablet, Rfl: 1  Current Facility-Administered Medications:    ipratropium-albuterol (DUONEB) 0.5-2.5 (3) MG/3ML nebulizer solution 3 mL, 3 mL, Nebulization, Once, Nafziger, Cory, NP  Review of Systems:  Constitutional: Denies fever, chills, diaphoresis, appetite change and fatigue.  HEENT: Denies photophobia, eye pain, redness, hearing loss, ear pain, congestion, sore throat, rhinorrhea, sneezing, mouth sores, trouble swallowing, neck pain, neck stiffness and tinnitus.   Respiratory: Denies SOB, DOE, cough, chest tightness,  and wheezing.   Cardiovascular: Denies chest pain, palpitations and leg swelling.  Gastrointestinal: Denies nausea, vomiting, abdominal pain, diarrhea, constipation, blood in stool and abdominal distention.  Genitourinary: Denies dysuria, urgency, frequency, hematuria, flank pain and difficulty urinating.  Endocrine: Denies: hot or cold intolerance, sweats, changes in hair or nails, polyuria, polydipsia. Musculoskeletal: Denies myalgias, back pain, joint swelling, arthralgias and gait problem.  Skin: Denies pallor, rash and wound.  Neurological: Denies dizziness, seizures,  syncope, weakness, light-headedness, numbness and headaches.  Hematological: Denies adenopathy. Easy bruising, personal or family bleeding history  Psychiatric/Behavioral: Denies suicidal ideation, mood changes, confusion, nervousness, sleep disturbance and agitation    Physical Exam: Vitals:   10/05/20 1056  BP: 110/80  Pulse: (!) 58  Temp: (!) 97.4 F (36.3 C)  TempSrc: Oral  SpO2: 98%  Weight: 131 lb 3.2 oz (59.5 kg)  Height: 5' 4.5" (1.638 m)    Body mass index is 22.17 kg/m.   Constitutional: NAD, calm, comfortable Eyes: PERRL, lids and conjunctivae normal ENMT: Mucous membranes are moist. Posterior pharynx clear of any exudate or lesions. Normal dentition. Tympanic membrane is pearly white, no erythema or bulging. Neck: normal, supple, no masses, no thyromegaly Respiratory: clear to auscultation bilaterally, no wheezing, no crackles. Normal respiratory effort. No accessory muscle use.  Cardiovascular: Regular rate and rhythm, no murmurs / rubs / gallops. No extremity edema. 2+ pedal pulses. No carotid bruits.  Abdomen: no tenderness, no masses palpated. No hepatosplenomegaly. Bowel sounds positive.  Musculoskeletal: no clubbing / cyanosis. No joint deformity upper and lower extremities. Good ROM, no contractures. Normal muscle tone.  Skin: no rashes, lesions, ulcers. No induration Neurologic: CN 2-12 grossly intact. Sensation intact, DTR normal. Strength 5/5 in all 4.  Psychiatric: Normal judgment and insight. Alert and oriented x 3. Normal mood.    Subsequent Medicare wellness visit   1. Risk factors, based on past  M,S,F -cardiovascular disease risk factors include history of hypertension and hyperlipidemia   2.  Physical activities: Walks 3 miles a day   3.  Depression/mood: History of depression but mood is stable   4.  Hearing: No perceived issues   5.  ADL's: Independent in all ADLs   6.  Fall risk: Low fall risk   7.  Home safety: No problems  identified   8.  Height weight, and visual acuity: height and weight as above, vision:  Vision Screening   Right eye Left eye Both eyes  Without correction '20/20 20/25 20/25 '$  With correction        9.  Counseling: Advised to update her vaccination status   10. Lab orders based on risk factors: Laboratory update will be reviewed   11. Referral : DEXA scan requested   12. Care plan: Follow-up with me in 6 to 12 months   13. Cognitive assessment: No cognitive impairment   14. Screening: Patient provided with a written and personalized 5-10 year screening schedule in the AVS. yes   15. Provider List Update: PCP only  16. Advance Directives: Full code   17. Opioids: Patient is not on any opioid prescriptions and has no risk factors for a substance use disorder.   [facility] Office Visit from 10/05/2020 in [facility]  PHQ-9 Total Score 0       Fall Risk  10/05/2020 10/02/2019 09/10/2018 01/16/2018 05/14/2017  Falls in the past year? 0 0 1 0 Yes  Number falls in past yr: 0 0 0 0 1  Comment - missed a step - - -  Injury with Fall? 0 0 1 0 Yes  Comment - - - - tripped over the dog and fell on [place]bo  Risk for fall due to : - - - - -     Impression and Plan:  Encounter for preventive health examination -She has routine eye and dental care. -Advise she update her shingles vaccine at the pharmacy. -Screening labs today. -Healthy lifestyle discussed in detail. -Due for DEXA scan this year. -She had a colonoscopy in 2018 and is a 10-year callback.  She will obtain results from last year's mammogram and forward to [place]. -She no longer does Pap smears due to age.  MENOPAUSAL SYNDROME  - Plan: Estradiol (VAGIFEM) 10 MCG TABS vaginal tablet  Encounter for osteoporosis screening in asymptomatic postmenopausal patient  - Plan: DG Bone Density  Primary hypertension -Blood pressure is currently well controlled, continue lisinopril 10 mg daily.  Multiple  thyroid nodules Iatrogenic hypothyroidism -Status post total thyroidectomy due to multinodular goiter. -She is currently on levothyroxine 88 mcg daily and this is monitored by endocrinology Dr. Chalmers Cater.  Dyslipidemia  - Plan: Lipid panel, Lipid panel -She is on ezetimibe 10 mg daily. -Lipids in August.  Reminded her COVID reports x1, triglycerides 129 and LDL 130  Current mild episode of major depressive disorder without prior episode ([place]) -Well-controlled on Lexapro.    Patient Instructions  -Nice seeing  you today!!  -Lab work today; will notify you once results are available.  -Remember your shingles and 2nd COVID booster at the pharmacy.  -Schedule follow up in 1 year or sooner as needed.     Lelon Frohlich, MD [facility]

## 2020-10-05 NOTE — Patient Instructions (Signed)
-  Nice seeing you today!!  -Lab work today; will notify you once results are available.  -Remember your shingles and 2nd COVID booster at the pharmacy.  -Schedule follow up in 1 year or sooner as needed.

## 2020-10-06 ENCOUNTER — Other Ambulatory Visit: Payer: Self-pay | Admitting: Internal Medicine

## 2020-10-06 DIAGNOSIS — E785 Hyperlipidemia, unspecified: Secondary | ICD-10-CM

## 2020-10-30 ENCOUNTER — Other Ambulatory Visit: Payer: Self-pay | Admitting: Internal Medicine

## 2020-10-30 DIAGNOSIS — I10 Essential (primary) hypertension: Secondary | ICD-10-CM

## 2020-11-26 ENCOUNTER — Other Ambulatory Visit: Payer: Self-pay | Admitting: Internal Medicine

## 2020-11-26 DIAGNOSIS — Z1231 Encounter for screening mammogram for malignant neoplasm of breast: Secondary | ICD-10-CM

## 2020-12-08 ENCOUNTER — Emergency Department (HOSPITAL_COMMUNITY)
Admission: EM | Admit: 2020-12-08 | Discharge: 2020-12-08 | Disposition: A | Discharge status: Home / Self Care | Payer: PPO | Attending: Emergency Medicine | Admitting: Emergency Medicine

## 2020-12-08 ENCOUNTER — Emergency Department (HOSPITAL_COMMUNITY): Payer: PPO

## 2020-12-08 ENCOUNTER — Other Ambulatory Visit: Payer: Self-pay

## 2020-12-08 ENCOUNTER — Encounter (HOSPITAL_COMMUNITY): Payer: Self-pay

## 2020-12-08 DIAGNOSIS — S61452A Open bite of left hand, initial encounter: Secondary | ICD-10-CM | POA: Insufficient documentation

## 2020-12-08 DIAGNOSIS — Z79899 Other long term (current) drug therapy: Secondary | ICD-10-CM | POA: Insufficient documentation

## 2020-12-08 DIAGNOSIS — Z96652 Presence of left artificial knee joint: Secondary | ICD-10-CM | POA: Insufficient documentation

## 2020-12-08 DIAGNOSIS — I10 Essential (primary) hypertension: Secondary | ICD-10-CM | POA: Diagnosis not present

## 2020-12-08 DIAGNOSIS — W540XXA Bitten by dog, initial encounter: Secondary | ICD-10-CM | POA: Diagnosis not present

## 2020-12-08 DIAGNOSIS — M25532 Pain in left wrist: Secondary | ICD-10-CM | POA: Diagnosis not present

## 2020-12-08 DIAGNOSIS — S61512A Laceration without foreign body of left wrist, initial encounter: Secondary | ICD-10-CM | POA: Diagnosis not present

## 2020-12-08 DIAGNOSIS — M7989 Other specified soft tissue disorders: Secondary | ICD-10-CM | POA: Diagnosis not present

## 2020-12-08 DIAGNOSIS — S61552A Open bite of left wrist, initial encounter: Secondary | ICD-10-CM | POA: Diagnosis not present

## 2020-12-08 MED ORDER — AMOXICILLIN-POT CLAVULANATE 875-125 MG PO TABS: 1.0000 | ORAL_TABLET | Freq: Two times a day (BID) | ORAL | 0 refills | Status: DC

## 2020-12-08 MED ORDER — LIDOCAINE-EPINEPHRINE (PF) 2 %-1:200000 IJ SOLN
10.0000 mL | Freq: Once | INTRAMUSCULAR | Status: AC
  Administered 2020-12-08: 10 mL
  Filled 2020-12-08: qty 20

## 2020-12-08 MED ORDER — OXYCODONE-ACETAMINOPHEN 5-325 MG PO TABS
1.0000 | ORAL_TABLET | Freq: Once | ORAL | Status: AC
  Administered 2020-12-08: 1 via ORAL
  Filled 2020-12-08: qty 1

## 2020-12-08 MED ORDER — BACITRACIN ZINC 500 UNIT/GM EX OINT: TOPICAL_OINTMENT | Freq: Two times a day (BID) | CUTANEOUS | Status: DC

## 2020-12-08 MED ORDER — ONDANSETRON 4 MG PO TBDP
4.0000 mg | ORAL_TABLET | Freq: Once | ORAL | Status: AC
  Administered 2020-12-08: 4 mg via ORAL
  Filled 2020-12-08: qty 1

## 2020-12-08 MED ORDER — BACITRACIN ZINC 500 UNIT/GM EX OINT
TOPICAL_OINTMENT | CUTANEOUS | Status: AC
  Filled 2020-12-08: qty 0.9

## 2020-12-08 NOTE — ED Triage Notes (Signed)
Pt presents with dog bite from neighbor's dog to left hand a wrist. Pt has open lacerations to hand and wrist with redness and swelling. Pt states she believes her last tetanus shot was 5 years ago.

## 2020-12-08 NOTE — ED Provider Notes (Signed)
[facility] DEPT Provider Note   CSN: [id number] Arrival date & time: 12/08/20  1405     History Chief Complaint  Patient presents with   Animal Bite    ECE CUMBERLAND is a 70 y.o. female presents to the emergency department for evaluation of left hand and wrist after an animal bite prior to arrival.  Patient reports the neighbors dog got off its collar and leash and came into their yard to attack their dog.  When the patient was trying to pull 2 dogs apart, the neighbors dog started attacking her.  Patient reports animal control was on the scene and verified that the neighbors dogs rabies vaccination was up-to-date.  He denies any falls, numbness, or tingling.  Denies any weakness to the area.  Patient is right-hand dominant.  Patient's last tetanus was 5 years ago.  Medical history includes hypertension.  Denies any blood thinner use.   Animal Bite Associated symptoms: no fever and no rash       Past Medical History:  Diagnosis Date   Anemia    after knee surgery ( only time)   Anxiety    Complication of anesthesia    Depression    History of urinary tract infection    Hypertension    no longer on medications  since knee surgery and pain resolved   Osteoarthritis    PONV (postoperative nausea and vomiting)    Situational anxiety 09/2003   Situational depression    Thyroid disease    hyper parathyroidism    Patient Active Problem List   Diagnosis Date Noted   Iatrogenic hypothyroidism 01/16/2018   Hyperthyroidism 10/21/2017   Multiple thyroid nodules 10/21/2017   Low serum thyroid stimulating hormone (TSH) 07/25/2017   Depression 02/16/2016   Iron deficiency anemia 09/21/2015   OA (osteoarthritis) of knee 08/02/2015   Dyslipidemia 01/03/2012   Hypertension 01/03/2012   HYPERCALCEMIA 10/29/2007   MENOPAUSAL SYNDROME 10/29/2007   Osteoarthritis 10/29/2007    Past Surgical History:  Procedure Laterality Date   abddominoplasty  1995    APPENDECTOMY  Age 35   BREAST REDUCTION SURGERY  09/2001   & Lift   COLONOSCOPY     EYE SURGERY     cataract bil   ORIF ELBOW FRACTURE Left 03/15/2017   Procedure: OPEN REDUCTION INTERNAL FIXATION (ORIF) ELBOW/OLECRANON FRACTURE;  Surgeon: Altamese Patriot, MD;  Location: [facility];  Service: Orthopedics;  Laterality: Left;   PARATHYROIDECTOMY  5/1/112   due to hypercalcemia   SHOULDER SURGERY     left rotator cuff; bicep tendon repair on left    THUMB FUSION  08/2007   with bone graft- post trauma Dr. Loney Loh   THYROIDECTOMY N/A 10/23/2017   Procedure: TOTAL THYROIDECTOMY;  Surgeon: Armandina Gemma, MD;  Location: [facility];  Service: General;  Laterality: N/A;   toatal knee replacement Right 2001   TOTAL KNEE ARTHROPLASTY Left 08/02/2015   Procedure: LEFT TOTAL KNEE ARTHROPLASTY;  Surgeon: Gaynelle Arabian, MD;  Location: [facility];  Service: Orthopedics;  Laterality: Left;   TOTAL THYROIDECTOMY     [facility]   TUBAL LIGATION  1994     OB History     Gravida  2   Para  2   Term  2   Preterm      AB      Living  2      SAB      IAB      Ectopic      Multiple  Live Births  1           Family History  Problem Relation Age of Onset   Coronary artery disease Father    Heart disease Father    Heart attack Father    Coronary artery disease Brother    Hypertension Brother    Cancer Mother    Leukemia Mother 62   Osteoporosis Mother    Hypertension Sister    Osteoporosis Sister    Hypertension Brother    Osteoporosis Maternal Aunt     Social History   Tobacco Use   Smoking status: Never   Smokeless tobacco: Never  Vaping Use   Vaping Use: Never used  Substance Use Topics   Alcohol use: Yes    Comment: occas glass of wine    Drug use: No    Home Medications Prior to Admission medications   Medication Sig Start Date End Date Taking? Authorizing Provider  amoxicillin-clavulanate (AUGMENTIN) 875-125 MG tablet Take 1 tablet by mouth every 12 (twelve) hours.  12/08/20  Yes Sherrell Puller, PA-C  ALPRAZolam Duanne Moron) 0.5 MG tablet Take 1 tablet (0.5 mg total) by mouth at bedtime as needed. 09/08/20   Isaac Bliss, Rayford Halsted, MD  Cholecalciferol (VITAMIN D) 50 MCG (2000 UT) tablet Take 2,000 Units by mouth daily.    [provider]  escitalopram (LEXAPRO) 10 MG tablet TAKE 1 TABLET(10 MG) BY MOUTH DAILY 05/03/20   Isaac Bliss, Rayford Halsted, MD  Estradiol (VAGIFEM) 10 MCG TABS vaginal tablet Place 1 tablet (10 mcg total) vaginally 2 (two) times a week. On Sunday and Thursday 10/07/20   Isaac Bliss, Rayford Halsted, MD  ezetimibe (ZETIA) 10 MG tablet TAKE 1 TABLET(10 MG) BY MOUTH DAILY Patient not taking: Reported on 10/05/2020 06/18/20   Isaac Bliss, Rayford Halsted, MD  levothyroxine (SYNTHROID) 88 MCG tablet levothyroxine 88 mcg tablet    [provider]  lisinopril (ZESTRIL) 10 MG tablet TAKE 1 TABLET BY MOUTH EVERY DAY 11/02/20   Isaac Bliss, Rayford Halsted, MD  Multiple Vitamin (MULTIVITAMIN ADULT PO) Take by mouth.    [provider]    Allergies    Ciprofloxacin, Tetanus-diphtheria toxoids td, Codeine, and Methimazole  Review of Systems   Review of Systems  Constitutional:  Negative for chills and fever.  HENT:  Negative for ear pain and sore throat.   Eyes:  Negative for pain and visual disturbance.  Respiratory:  Negative for cough and shortness of breath.   Cardiovascular:  Negative for chest pain and palpitations.  Gastrointestinal:  Negative for abdominal pain and vomiting.  Genitourinary:  Negative for dysuria and hematuria.  Musculoskeletal:  Positive for myalgias. Negative for arthralgias and back pain.  Skin:  Positive for wound. Negative for color change and rash.  Neurological:  Negative for seizures and syncope.  All other systems reviewed and are negative.  Physical Exam Updated Vital Signs BP (!) 180/98   Pulse 89   Temp 98 F (36.7 C) (Oral)   Resp 18   LMP 11/10/2014 (Exact Date)   SpO2 100%    Physical Exam Constitutional:      General: She is not in acute distress.    Appearance: Normal appearance. She is not toxic-appearing.  HENT:     Head: Normocephalic and atraumatic.  Eyes:     General: No scleral icterus. Cardiovascular:     Rate and Rhythm: Normal rate and regular rhythm.  Pulmonary:     Effort: Pulmonary effort is normal. No respiratory distress.  Breath sounds: Normal breath sounds.  Abdominal:     General: Abdomen is flat.     Palpations: Abdomen is soft.  Musculoskeletal:        General: Swelling and tenderness present. No deformity.     Comments: Puncture wounds noted to the patient's dorsum of hand and volar wrist.  Deeper laceration to the dorsum of the patient's left wrist on the extensor surface. Patient laceration on the dorsum of the left index finger.  No foreign bodies noted.  Sensation intact.  Radial pulses intact.  Cap refill less than 2 seconds.  See picture for more detail.  No snuffbox tenderness.  Limited range of motion secondary to pain.  Skin:    General: Skin is warm and dry.     Capillary Refill: Capillary refill takes less than 2 seconds.     Findings: No rash.  Neurological:     General: No focal deficit present.     Mental Status: She is alert. Mental status is at baseline.  Psychiatric:        Mood and Affect: Mood normal.      ED Results / Procedures / Treatments   Labs (all labs ordered are listed, but only abnormal results are displayed) Labs Reviewed - No data to display  EKG None  Radiology DG Wrist Complete Left  Result Date: 12/08/2020 CLINICAL DATA:  Dog bite EXAM: LEFT WRIST - COMPLETE 3+ VIEW COMPARISON:  None. FINDINGS: Negative for fracture.  Mild osteoarthritis in the wrist. Dorsal soft tissue swelling and gas in the soft tissues compatible with laceration. No foreign body. IMPRESSION: Soft tissue injury dorsal to the wrist.  Negative for fracture. Electronically Signed   By: Franchot Gallo M.D.   On:  12/08/2020 16:07   DG Hand Complete Left  Result Date: 12/08/2020 CLINICAL DATA:  Dog bite EXAM: LEFT HAND - COMPLETE 3+ VIEW COMPARISON:  None. FINDINGS: Negative for fracture Mild osteoarthritis Dorsal soft tissue swelling and laceration posterior to the wrist joint. Negative for foreign body. IMPRESSION: Soft tissue injury dorsal to the wrist.  Negative for fracture. Electronically Signed   By: Franchot Gallo M.D.   On: 12/08/2020 16:06    Procedures .Marland KitchenLaceration Repair  Date/Time: 12/08/2020 6:05 PM Performed by: Sherrell Puller, PA-C Authorized by: Sherrell Puller, PA-C   Consent:    Consent obtained:  Verbal   Consent given by:  Patient   Risks discussed:  Infection, need for additional repair, pain, poor cosmetic result and poor wound healing   Alternatives discussed:  No treatment and delayed treatment Universal protocol:    Procedure explained and questions answered to patient or proxy's satisfaction: yes     Relevant documents present and verified: no     Test results available: no     Imaging studies available: yes     Required blood products, implants, devices, and special equipment available: no     Site/side marked: no     Immediately prior to procedure, a time out was called: no     Patient identity confirmed:  Verbally with patient Anesthesia:    Anesthesia method:  Local infiltration   Local anesthetic:  Lidocaine 2% WITH epi Laceration details:    Location:  Hand   Hand location:  L wrist   Length (cm):  2   Depth (mm):  2 Pre-procedure details:    Preparation:  Patient was prepped and draped in usual sterile fashion and imaging obtained to evaluate for foreign bodies Exploration:  Imaging obtained: x-ray     Imaging outcome: foreign body not noted     Wound exploration: wound explored through full range of motion and entire depth of wound visualized   Treatment:    Area cleansed with:  Povidone-iodine and saline   Amount of cleaning:  Extensive    Irrigation solution:  Sterile saline   Irrigation volume:  570mL   Irrigation method:  Pressure wash, syringe and tap Skin repair:    Repair method:  Sutures   Suture size:  4-0   Suture material:  Prolene   Suture technique:  Simple interrupted   Number of sutures:  1 Approximation:    Approximation:  Loose Repair type:    Repair type:  Simple Post-procedure details:    Dressing:  Antibiotic ointment, splint for protection and sterile dressing   Procedure completion:  Tolerated well, no immediate complications Comments:     Patient had laceration to dorsum of left wrist near the base of the thumb from a dog bite.  Due to the depth of the wound and it being on a extensor surface, 1 loose suture with 4-0 Prolene was placed.  Bacitracin ointment applied.  Wrist splint ordered.   Medications Ordered in ED Medications  oxyCODONE-acetaminophen (PERCOCET/ROXICET) 5-325 MG per tablet 1 tablet (has no administration in time range)  ondansetron (ZOFRAN-ODT) disintegrating tablet 4 mg (has no administration in time range)  lidocaine-EPINEPHrine (XYLOCAINE W/EPI) 2 %-1:200000 (PF) injection 10 mL (has no administration in time range)    ED Course  I have reviewed the triage vital signs and the nursing notes.  Pertinent labs & imaging results that were available during my care of the patient were reviewed by me and considered in my medical decision making (see chart for details).  BENNETTE HASTY is a 70 year old female presenting with left wrist pain after dog bite today.  Tetanus is up-to-date.  The patient is not on any blood thinners.  Animal control confirmed neighbors dogs rabies vaccine was up-to-date.    Personally reviewed and interpreted the patient's imaging.  Left hand and wrist x-ray show soft tissue swelling noted, but no acute fracture.  Due to the depth and extent of the laceration to the dorsum of the left wrist, 1 loose suture was put in.  Beforehand, the wounds were  extensively cleaned by both the nurse and I with normal saline and Betadine. I discussed with the patient that animal bite lacerations are usually not sutured due to the risk for infection.  Gave her return precaution examples such as purulent discharge, excess bleeding, erythema, swelling, increased warmth, inability move rest, to return to the nearest emergency department immediately.  Patient was prescribed Augmentin and given follow-up instructions with hand surgery.  Recommended twice daily wound cleanings.  Gave patient wrist splint to cover additional puncture and lacerations.  Patient agrees with plan.  Patient is being discharged home in good condition.   MDM Rules/Calculators/A&P                           Final Clinical Impression(s) / ED Diagnoses Final diagnoses:  Dog bite, initial encounter  Left wrist pain    Rx / DC Orders ED Discharge Orders          Ordered    amoxicillin-clavulanate (AUGMENTIN) 875-125 MG tablet  Every 12 hours        12/08/20 1634  Sherrell Puller, PA-C 12/09/20 7915    Regan Lemming, MD 12/09/20 1100

## 2020-12-08 NOTE — Discharge Instructions (Addendum)
You were seen here for evaluation of your left hand after a dog bite.  You been prescribed Augmentin to take twice daily for the next 7 days.  It is very important that you take medication as prescribed and complete the entire course to prevent infection.  This medication may upset your stomach, so please take with food.  You take Tylenol/ibuprofen for pain as needed.  Tetanus is up-to-date.  Because animal control found the dogs rabies status, no rabies vaccination is needed for you.  Dog bites are not something usually sutured due to the increased risk of infection, but given your wound and the depth of it, 1 suture was placed loosely.  Please follow-up with hand surgery in a week for suture removal and reevaluation of wound.  If you develop any fever, purulent discharge, increased redness, warmth, or pain to the area please return to the nearest emergency department.  If you have any concern, new or worsening symptoms, please return to the nearest emergency department.  Work provided if needed.

## 2020-12-08 NOTE — ED Notes (Signed)
I provided basic, sterile gauze wrapping to pt's injury, bleeding controlled until pt can be assessed in a triage room. Notified triage RN.

## 2020-12-08 NOTE — ED Notes (Signed)
Wound irrigated with normal saline and betadine

## 2020-12-10 DIAGNOSIS — S61452D Open bite of left hand, subsequent encounter: Secondary | ICD-10-CM | POA: Diagnosis not present

## 2020-12-10 DIAGNOSIS — S61452A Open bite of left hand, initial encounter: Secondary | ICD-10-CM | POA: Insufficient documentation

## 2020-12-13 ENCOUNTER — Other Ambulatory Visit: Payer: Self-pay | Admitting: Internal Medicine

## 2020-12-13 DIAGNOSIS — F411 Generalized anxiety disorder: Secondary | ICD-10-CM

## 2020-12-14 DIAGNOSIS — S61452D Open bite of left hand, subsequent encounter: Secondary | ICD-10-CM | POA: Diagnosis not present

## 2020-12-24 ENCOUNTER — Ambulatory Visit
Admission: RE | Admit: 2020-12-24 | Discharge: 2020-12-24 | Disposition: A | Discharge status: Home / Self Care | Payer: PPO | Source: Ambulatory Visit | Attending: Internal Medicine | Admitting: Internal Medicine

## 2020-12-24 ENCOUNTER — Other Ambulatory Visit: Payer: Self-pay

## 2020-12-24 DIAGNOSIS — Z1231 Encounter for screening mammogram for malignant neoplasm of breast: Secondary | ICD-10-CM

## 2020-12-30 DIAGNOSIS — S61452D Open bite of left hand, subsequent encounter: Secondary | ICD-10-CM | POA: Diagnosis not present

## 2021-01-18 ENCOUNTER — Other Ambulatory Visit: Payer: Self-pay | Admitting: Internal Medicine

## 2021-01-18 DIAGNOSIS — F419 Anxiety disorder, unspecified: Secondary | ICD-10-CM

## 2021-04-07 DIAGNOSIS — H43812 Vitreous degeneration, left eye: Secondary | ICD-10-CM | POA: Diagnosis not present

## 2021-04-07 DIAGNOSIS — H35371 Puckering of macula, right eye: Secondary | ICD-10-CM | POA: Diagnosis not present

## 2021-04-07 DIAGNOSIS — Z961 Presence of intraocular lens: Secondary | ICD-10-CM | POA: Diagnosis not present

## 2021-04-07 DIAGNOSIS — H04123 Dry eye syndrome of bilateral lacrimal glands: Secondary | ICD-10-CM | POA: Diagnosis not present

## 2021-04-29 ENCOUNTER — Other Ambulatory Visit: Payer: Self-pay | Admitting: Internal Medicine

## 2021-04-29 DIAGNOSIS — F411 Generalized anxiety disorder: Secondary | ICD-10-CM

## 2021-04-29 DIAGNOSIS — F419 Anxiety disorder, unspecified: Secondary | ICD-10-CM

## 2021-05-06 ENCOUNTER — Other Ambulatory Visit: Payer: Self-pay

## 2021-05-06 ENCOUNTER — Ambulatory Visit
Admission: RE | Admit: 2021-05-06 | Discharge: 2021-05-06 | Disposition: A | Discharge status: Home / Self Care | Payer: PPO | Source: Ambulatory Visit | Attending: Internal Medicine | Admitting: Internal Medicine

## 2021-05-06 DIAGNOSIS — Z13820 Encounter for screening for osteoporosis: Secondary | ICD-10-CM

## 2021-05-06 DIAGNOSIS — Z780 Asymptomatic menopausal state: Secondary | ICD-10-CM

## 2021-05-06 DIAGNOSIS — M8589 Other specified disorders of bone density and structure, multiple sites: Secondary | ICD-10-CM | POA: Diagnosis not present

## 2021-05-06 DIAGNOSIS — M810 Age-related osteoporosis without current pathological fracture: Secondary | ICD-10-CM | POA: Diagnosis not present

## 2021-05-12 DIAGNOSIS — E213 Hyperparathyroidism, unspecified: Secondary | ICD-10-CM | POA: Diagnosis not present

## 2021-05-12 DIAGNOSIS — E890 Postprocedural hypothyroidism: Secondary | ICD-10-CM | POA: Diagnosis not present

## 2021-05-19 DIAGNOSIS — E890 Postprocedural hypothyroidism: Secondary | ICD-10-CM | POA: Diagnosis not present

## 2021-05-19 DIAGNOSIS — C73 Malignant neoplasm of thyroid gland: Secondary | ICD-10-CM | POA: Diagnosis not present

## 2021-05-19 DIAGNOSIS — E213 Hyperparathyroidism, unspecified: Secondary | ICD-10-CM | POA: Diagnosis not present

## 2021-05-19 DIAGNOSIS — M8580 Other specified disorders of bone density and structure, unspecified site: Secondary | ICD-10-CM | POA: Diagnosis not present

## 2021-05-19 DIAGNOSIS — I10 Essential (primary) hypertension: Secondary | ICD-10-CM | POA: Diagnosis not present

## 2021-05-30 ENCOUNTER — Other Ambulatory Visit: Payer: Self-pay | Admitting: Internal Medicine

## 2021-05-30 DIAGNOSIS — I10 Essential (primary) hypertension: Secondary | ICD-10-CM

## 2021-07-15 ENCOUNTER — Encounter: Payer: Self-pay | Admitting: Internal Medicine

## 2021-07-27 ENCOUNTER — Other Ambulatory Visit: Payer: Self-pay | Admitting: Internal Medicine

## 2021-07-27 DIAGNOSIS — F419 Anxiety disorder, unspecified: Secondary | ICD-10-CM

## 2021-08-02 ENCOUNTER — Encounter: Payer: Self-pay | Admitting: Internal Medicine

## 2021-08-02 ENCOUNTER — Telehealth (INDEPENDENT_AMBULATORY_CARE_PROVIDER_SITE_OTHER): Payer: PPO | Admitting: Internal Medicine

## 2021-08-02 VITALS — Wt 131.0 lb

## 2021-08-02 DIAGNOSIS — M5412 Radiculopathy, cervical region: Secondary | ICD-10-CM

## 2021-08-02 DIAGNOSIS — Z780 Asymptomatic menopausal state: Secondary | ICD-10-CM | POA: Diagnosis not present

## 2021-08-02 DIAGNOSIS — Z13820 Encounter for screening for osteoporosis: Secondary | ICD-10-CM

## 2021-08-02 MED ORDER — ALENDRONATE SODIUM 70 MG PO TABS: 70.0000 mg | ORAL_TABLET | ORAL | 11 refills | Status: DC
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
## 2021-08-22 DIAGNOSIS — M542 Cervicalgia: Secondary | ICD-10-CM | POA: Diagnosis not present

## 2021-08-22 DIAGNOSIS — M9902 Segmental and somatic dysfunction of thoracic region: Secondary | ICD-10-CM | POA: Diagnosis not present

## 2021-08-22 DIAGNOSIS — M5031 Other cervical disc degeneration,  high cervical region: Secondary | ICD-10-CM | POA: Diagnosis not present

## 2021-08-22 DIAGNOSIS — M9901 Segmental and somatic dysfunction of cervical region: Secondary | ICD-10-CM | POA: Diagnosis not present

## 2021-08-22 DIAGNOSIS — M50321 Other cervical disc degeneration at C4-C5 level: Secondary | ICD-10-CM | POA: Diagnosis not present

## 2021-08-23 ENCOUNTER — Encounter: Payer: Self-pay | Admitting: Internal Medicine

## 2021-08-23 DIAGNOSIS — M50321 Other cervical disc degeneration at C4-C5 level: Secondary | ICD-10-CM | POA: Diagnosis not present

## 2021-08-23 DIAGNOSIS — M9902 Segmental and somatic dysfunction of thoracic region: Secondary | ICD-10-CM | POA: Diagnosis not present

## 2021-08-23 DIAGNOSIS — M9901 Segmental and somatic dysfunction of cervical region: Secondary | ICD-10-CM | POA: Diagnosis not present

## 2021-08-23 DIAGNOSIS — M542 Cervicalgia: Secondary | ICD-10-CM | POA: Diagnosis not present

## 2021-08-23 DIAGNOSIS — M5031 Other cervical disc degeneration,  high cervical region: Secondary | ICD-10-CM | POA: Diagnosis not present

## 2021-08-24 DIAGNOSIS — M9901 Segmental and somatic dysfunction of cervical region: Secondary | ICD-10-CM | POA: Diagnosis not present

## 2021-08-24 DIAGNOSIS — M50321 Other cervical disc degeneration at C4-C5 level: Secondary | ICD-10-CM | POA: Diagnosis not present

## 2021-08-24 DIAGNOSIS — M9902 Segmental and somatic dysfunction of thoracic region: Secondary | ICD-10-CM | POA: Diagnosis not present

## 2021-08-24 DIAGNOSIS — M542 Cervicalgia: Secondary | ICD-10-CM | POA: Diagnosis not present

## 2021-08-24 DIAGNOSIS — M5031 Other cervical disc degeneration,  high cervical region: Secondary | ICD-10-CM | POA: Diagnosis not present

## 2021-08-25 DIAGNOSIS — M50321 Other cervical disc degeneration at C4-C5 level: Secondary | ICD-10-CM | POA: Diagnosis not present

## 2021-08-25 DIAGNOSIS — M9902 Segmental and somatic dysfunction of thoracic region: Secondary | ICD-10-CM | POA: Diagnosis not present

## 2021-08-25 DIAGNOSIS — M5031 Other cervical disc degeneration,  high cervical region: Secondary | ICD-10-CM | POA: Diagnosis not present

## 2021-08-25 DIAGNOSIS — M542 Cervicalgia: Secondary | ICD-10-CM | POA: Diagnosis not present

## 2021-08-25 DIAGNOSIS — M9901 Segmental and somatic dysfunction of cervical region: Secondary | ICD-10-CM | POA: Diagnosis not present

## 2021-08-28 ENCOUNTER — Ambulatory Visit
Admission: RE | Admit: 2021-08-28 | Discharge: 2021-08-28 | Disposition: A | Discharge status: Home / Self Care | Payer: PPO | Source: Ambulatory Visit | Attending: Internal Medicine | Admitting: Internal Medicine

## 2021-08-28 DIAGNOSIS — M5412 Radiculopathy, cervical region: Secondary | ICD-10-CM

## 2021-08-29 DIAGNOSIS — M5031 Other cervical disc degeneration,  high cervical region: Secondary | ICD-10-CM | POA: Diagnosis not present

## 2021-08-29 DIAGNOSIS — M9901 Segmental and somatic dysfunction of cervical region: Secondary | ICD-10-CM | POA: Diagnosis not present

## 2021-08-29 DIAGNOSIS — M542 Cervicalgia: Secondary | ICD-10-CM | POA: Diagnosis not present

## 2021-08-29 DIAGNOSIS — M9902 Segmental and somatic dysfunction of thoracic region: Secondary | ICD-10-CM | POA: Diagnosis not present

## 2021-08-29 DIAGNOSIS — M50321 Other cervical disc degeneration at C4-C5 level: Secondary | ICD-10-CM | POA: Diagnosis not present

## 2021-08-31 DIAGNOSIS — M542 Cervicalgia: Secondary | ICD-10-CM | POA: Diagnosis not present

## 2021-08-31 DIAGNOSIS — M9902 Segmental and somatic dysfunction of thoracic region: Secondary | ICD-10-CM | POA: Diagnosis not present

## 2021-08-31 DIAGNOSIS — M50321 Other cervical disc degeneration at C4-C5 level: Secondary | ICD-10-CM | POA: Diagnosis not present

## 2021-08-31 DIAGNOSIS — M5031 Other cervical disc degeneration,  high cervical region: Secondary | ICD-10-CM | POA: Diagnosis not present

## 2021-08-31 DIAGNOSIS — M9901 Segmental and somatic dysfunction of cervical region: Secondary | ICD-10-CM | POA: Diagnosis not present

## 2021-09-01 DIAGNOSIS — M50321 Other cervical disc degeneration at C4-C5 level: Secondary | ICD-10-CM | POA: Diagnosis not present

## 2021-09-01 DIAGNOSIS — M9901 Segmental and somatic dysfunction of cervical region: Secondary | ICD-10-CM | POA: Diagnosis not present

## 2021-09-01 DIAGNOSIS — M9902 Segmental and somatic dysfunction of thoracic region: Secondary | ICD-10-CM | POA: Diagnosis not present

## 2021-09-01 DIAGNOSIS — M5031 Other cervical disc degeneration,  high cervical region: Secondary | ICD-10-CM | POA: Diagnosis not present

## 2021-09-01 DIAGNOSIS — M542 Cervicalgia: Secondary | ICD-10-CM | POA: Diagnosis not present

## 2021-09-05 DIAGNOSIS — M9901 Segmental and somatic dysfunction of cervical region: Secondary | ICD-10-CM | POA: Diagnosis not present

## 2021-09-05 DIAGNOSIS — M5031 Other cervical disc degeneration,  high cervical region: Secondary | ICD-10-CM | POA: Diagnosis not present

## 2021-09-05 DIAGNOSIS — M50321 Other cervical disc degeneration at C4-C5 level: Secondary | ICD-10-CM | POA: Diagnosis not present

## 2021-09-05 DIAGNOSIS — M9902 Segmental and somatic dysfunction of thoracic region: Secondary | ICD-10-CM | POA: Diagnosis not present

## 2021-09-05 DIAGNOSIS — M542 Cervicalgia: Secondary | ICD-10-CM | POA: Diagnosis not present

## 2021-09-06 ENCOUNTER — Encounter: Payer: Self-pay | Admitting: Internal Medicine

## 2021-09-06 DIAGNOSIS — M5031 Other cervical disc degeneration,  high cervical region: Secondary | ICD-10-CM | POA: Diagnosis not present

## 2021-09-06 DIAGNOSIS — M9902 Segmental and somatic dysfunction of thoracic region: Secondary | ICD-10-CM | POA: Diagnosis not present

## 2021-09-06 DIAGNOSIS — M50321 Other cervical disc degeneration at C4-C5 level: Secondary | ICD-10-CM | POA: Diagnosis not present

## 2021-09-06 DIAGNOSIS — M9901 Segmental and somatic dysfunction of cervical region: Secondary | ICD-10-CM | POA: Diagnosis not present

## 2021-09-06 DIAGNOSIS — M542 Cervicalgia: Secondary | ICD-10-CM | POA: Diagnosis not present

## 2021-09-08 DIAGNOSIS — M9901 Segmental and somatic dysfunction of cervical region: Secondary | ICD-10-CM | POA: Diagnosis not present

## 2021-09-08 DIAGNOSIS — M9902 Segmental and somatic dysfunction of thoracic region: Secondary | ICD-10-CM | POA: Diagnosis not present

## 2021-09-08 DIAGNOSIS — M50321 Other cervical disc degeneration at C4-C5 level: Secondary | ICD-10-CM | POA: Diagnosis not present

## 2021-09-08 DIAGNOSIS — M5031 Other cervical disc degeneration,  high cervical region: Secondary | ICD-10-CM | POA: Diagnosis not present

## 2021-09-08 DIAGNOSIS — M542 Cervicalgia: Secondary | ICD-10-CM | POA: Diagnosis not present

## 2021-09-12 DIAGNOSIS — M9902 Segmental and somatic dysfunction of thoracic region: Secondary | ICD-10-CM | POA: Diagnosis not present

## 2021-09-12 DIAGNOSIS — M9901 Segmental and somatic dysfunction of cervical region: Secondary | ICD-10-CM | POA: Diagnosis not present

## 2021-09-12 DIAGNOSIS — M542 Cervicalgia: Secondary | ICD-10-CM | POA: Diagnosis not present

## 2021-09-12 DIAGNOSIS — M5031 Other cervical disc degeneration,  high cervical region: Secondary | ICD-10-CM | POA: Diagnosis not present

## 2021-09-12 DIAGNOSIS — M50321 Other cervical disc degeneration at C4-C5 level: Secondary | ICD-10-CM | POA: Diagnosis not present

## 2021-09-13 DIAGNOSIS — M50321 Other cervical disc degeneration at C4-C5 level: Secondary | ICD-10-CM | POA: Diagnosis not present

## 2021-09-13 DIAGNOSIS — M542 Cervicalgia: Secondary | ICD-10-CM | POA: Diagnosis not present

## 2021-09-13 DIAGNOSIS — M5031 Other cervical disc degeneration,  high cervical region: Secondary | ICD-10-CM | POA: Diagnosis not present

## 2021-09-13 DIAGNOSIS — M9902 Segmental and somatic dysfunction of thoracic region: Secondary | ICD-10-CM | POA: Diagnosis not present

## 2021-09-13 DIAGNOSIS — M9901 Segmental and somatic dysfunction of cervical region: Secondary | ICD-10-CM | POA: Diagnosis not present

## 2021-09-15 DIAGNOSIS — M50321 Other cervical disc degeneration at C4-C5 level: Secondary | ICD-10-CM | POA: Diagnosis not present

## 2021-09-15 DIAGNOSIS — M9901 Segmental and somatic dysfunction of cervical region: Secondary | ICD-10-CM | POA: Diagnosis not present

## 2021-09-15 DIAGNOSIS — M542 Cervicalgia: Secondary | ICD-10-CM | POA: Diagnosis not present

## 2021-09-15 DIAGNOSIS — M5031 Other cervical disc degeneration,  high cervical region: Secondary | ICD-10-CM | POA: Diagnosis not present

## 2021-09-15 DIAGNOSIS — M9902 Segmental and somatic dysfunction of thoracic region: Secondary | ICD-10-CM | POA: Diagnosis not present

## 2021-09-19 DIAGNOSIS — M542 Cervicalgia: Secondary | ICD-10-CM | POA: Diagnosis not present

## 2021-09-19 DIAGNOSIS — M5031 Other cervical disc degeneration,  high cervical region: Secondary | ICD-10-CM | POA: Diagnosis not present

## 2021-09-19 DIAGNOSIS — M9901 Segmental and somatic dysfunction of cervical region: Secondary | ICD-10-CM | POA: Diagnosis not present

## 2021-09-19 DIAGNOSIS — M9902 Segmental and somatic dysfunction of thoracic region: Secondary | ICD-10-CM | POA: Diagnosis not present

## 2021-09-19 DIAGNOSIS — M50321 Other cervical disc degeneration at C4-C5 level: Secondary | ICD-10-CM | POA: Diagnosis not present

## 2021-09-22 DIAGNOSIS — M50321 Other cervical disc degeneration at C4-C5 level: Secondary | ICD-10-CM | POA: Diagnosis not present

## 2021-09-22 DIAGNOSIS — M9901 Segmental and somatic dysfunction of cervical region: Secondary | ICD-10-CM | POA: Diagnosis not present

## 2021-09-22 DIAGNOSIS — M9902 Segmental and somatic dysfunction of thoracic region: Secondary | ICD-10-CM | POA: Diagnosis not present

## 2021-09-22 DIAGNOSIS — M542 Cervicalgia: Secondary | ICD-10-CM | POA: Diagnosis not present

## 2021-09-22 DIAGNOSIS — M5031 Other cervical disc degeneration,  high cervical region: Secondary | ICD-10-CM | POA: Diagnosis not present

## 2021-09-26 DIAGNOSIS — M50321 Other cervical disc degeneration at C4-C5 level: Secondary | ICD-10-CM | POA: Diagnosis not present

## 2021-09-26 DIAGNOSIS — M9901 Segmental and somatic dysfunction of cervical region: Secondary | ICD-10-CM | POA: Diagnosis not present

## 2021-09-26 DIAGNOSIS — M542 Cervicalgia: Secondary | ICD-10-CM | POA: Diagnosis not present

## 2021-09-26 DIAGNOSIS — M5031 Other cervical disc degeneration,  high cervical region: Secondary | ICD-10-CM | POA: Diagnosis not present

## 2021-09-26 DIAGNOSIS — M9902 Segmental and somatic dysfunction of thoracic region: Secondary | ICD-10-CM | POA: Diagnosis not present

## 2021-09-29 DIAGNOSIS — M9901 Segmental and somatic dysfunction of cervical region: Secondary | ICD-10-CM | POA: Diagnosis not present

## 2021-09-29 DIAGNOSIS — M542 Cervicalgia: Secondary | ICD-10-CM | POA: Diagnosis not present

## 2021-09-29 DIAGNOSIS — M50321 Other cervical disc degeneration at C4-C5 level: Secondary | ICD-10-CM | POA: Diagnosis not present

## 2021-09-29 DIAGNOSIS — M5031 Other cervical disc degeneration,  high cervical region: Secondary | ICD-10-CM | POA: Diagnosis not present

## 2021-09-29 DIAGNOSIS — M9902 Segmental and somatic dysfunction of thoracic region: Secondary | ICD-10-CM | POA: Diagnosis not present

## 2021-10-03 DIAGNOSIS — M9901 Segmental and somatic dysfunction of cervical region: Secondary | ICD-10-CM | POA: Diagnosis not present

## 2021-10-03 DIAGNOSIS — M9902 Segmental and somatic dysfunction of thoracic region: Secondary | ICD-10-CM | POA: Diagnosis not present

## 2021-10-03 DIAGNOSIS — M50321 Other cervical disc degeneration at C4-C5 level: Secondary | ICD-10-CM | POA: Diagnosis not present

## 2021-10-03 DIAGNOSIS — M5031 Other cervical disc degeneration,  high cervical region: Secondary | ICD-10-CM | POA: Diagnosis not present

## 2021-10-03 DIAGNOSIS — M542 Cervicalgia: Secondary | ICD-10-CM | POA: Diagnosis not present

## 2021-10-06 DIAGNOSIS — M9901 Segmental and somatic dysfunction of cervical region: Secondary | ICD-10-CM | POA: Diagnosis not present

## 2021-10-06 DIAGNOSIS — M50321 Other cervical disc degeneration at C4-C5 level: Secondary | ICD-10-CM | POA: Diagnosis not present

## 2021-10-06 DIAGNOSIS — M5031 Other cervical disc degeneration,  high cervical region: Secondary | ICD-10-CM | POA: Diagnosis not present

## 2021-10-06 DIAGNOSIS — M542 Cervicalgia: Secondary | ICD-10-CM | POA: Diagnosis not present

## 2021-10-06 DIAGNOSIS — M9902 Segmental and somatic dysfunction of thoracic region: Secondary | ICD-10-CM | POA: Diagnosis not present

## 2021-10-10 DIAGNOSIS — M542 Cervicalgia: Secondary | ICD-10-CM | POA: Diagnosis not present

## 2021-10-10 DIAGNOSIS — M9901 Segmental and somatic dysfunction of cervical region: Secondary | ICD-10-CM | POA: Diagnosis not present

## 2021-10-10 DIAGNOSIS — M5031 Other cervical disc degeneration,  high cervical region: Secondary | ICD-10-CM | POA: Diagnosis not present

## 2021-10-10 DIAGNOSIS — M50321 Other cervical disc degeneration at C4-C5 level: Secondary | ICD-10-CM | POA: Diagnosis not present

## 2021-10-10 DIAGNOSIS — M9902 Segmental and somatic dysfunction of thoracic region: Secondary | ICD-10-CM | POA: Diagnosis not present

## 2021-10-13 DIAGNOSIS — M9902 Segmental and somatic dysfunction of thoracic region: Secondary | ICD-10-CM | POA: Diagnosis not present

## 2021-10-13 DIAGNOSIS — M5031 Other cervical disc degeneration,  high cervical region: Secondary | ICD-10-CM | POA: Diagnosis not present

## 2021-10-13 DIAGNOSIS — M542 Cervicalgia: Secondary | ICD-10-CM | POA: Diagnosis not present

## 2021-10-13 DIAGNOSIS — M50321 Other cervical disc degeneration at C4-C5 level: Secondary | ICD-10-CM | POA: Diagnosis not present

## 2021-10-13 DIAGNOSIS — M9901 Segmental and somatic dysfunction of cervical region: Secondary | ICD-10-CM | POA: Diagnosis not present

## 2021-10-20 DIAGNOSIS — M9902 Segmental and somatic dysfunction of thoracic region: Secondary | ICD-10-CM | POA: Diagnosis not present

## 2021-10-20 DIAGNOSIS — M542 Cervicalgia: Secondary | ICD-10-CM | POA: Diagnosis not present

## 2021-10-20 DIAGNOSIS — M5031 Other cervical disc degeneration,  high cervical region: Secondary | ICD-10-CM | POA: Diagnosis not present

## 2021-10-20 DIAGNOSIS — M50321 Other cervical disc degeneration at C4-C5 level: Secondary | ICD-10-CM | POA: Diagnosis not present

## 2021-10-20 DIAGNOSIS — M9901 Segmental and somatic dysfunction of cervical region: Secondary | ICD-10-CM | POA: Diagnosis not present

## 2021-10-24 DIAGNOSIS — M9902 Segmental and somatic dysfunction of thoracic region: Secondary | ICD-10-CM | POA: Diagnosis not present

## 2021-10-24 DIAGNOSIS — M50321 Other cervical disc degeneration at C4-C5 level: Secondary | ICD-10-CM | POA: Diagnosis not present

## 2021-10-24 DIAGNOSIS — M542 Cervicalgia: Secondary | ICD-10-CM | POA: Diagnosis not present

## 2021-10-24 DIAGNOSIS — M5031 Other cervical disc degeneration,  high cervical region: Secondary | ICD-10-CM | POA: Diagnosis not present

## 2021-10-24 DIAGNOSIS — M9901 Segmental and somatic dysfunction of cervical region: Secondary | ICD-10-CM | POA: Diagnosis not present

## 2021-10-26 DIAGNOSIS — M5031 Other cervical disc degeneration,  high cervical region: Secondary | ICD-10-CM | POA: Diagnosis not present

## 2021-10-26 DIAGNOSIS — M50321 Other cervical disc degeneration at C4-C5 level: Secondary | ICD-10-CM | POA: Diagnosis not present

## 2021-10-26 DIAGNOSIS — M9901 Segmental and somatic dysfunction of cervical region: Secondary | ICD-10-CM | POA: Diagnosis not present

## 2021-10-26 DIAGNOSIS — M542 Cervicalgia: Secondary | ICD-10-CM | POA: Diagnosis not present

## 2021-10-26 DIAGNOSIS — M9902 Segmental and somatic dysfunction of thoracic region: Secondary | ICD-10-CM | POA: Diagnosis not present

## 2021-11-02 ENCOUNTER — Other Ambulatory Visit: Payer: Self-pay | Admitting: Internal Medicine

## 2021-11-02 DIAGNOSIS — F419 Anxiety disorder, unspecified: Secondary | ICD-10-CM

## 2021-11-02 DIAGNOSIS — M9901 Segmental and somatic dysfunction of cervical region: Secondary | ICD-10-CM | POA: Diagnosis not present

## 2021-11-02 DIAGNOSIS — M542 Cervicalgia: Secondary | ICD-10-CM | POA: Diagnosis not present

## 2021-11-02 DIAGNOSIS — M5031 Other cervical disc degeneration,  high cervical region: Secondary | ICD-10-CM | POA: Diagnosis not present

## 2021-11-02 DIAGNOSIS — M50321 Other cervical disc degeneration at C4-C5 level: Secondary | ICD-10-CM | POA: Diagnosis not present

## 2021-11-02 DIAGNOSIS — M9902 Segmental and somatic dysfunction of thoracic region: Secondary | ICD-10-CM | POA: Diagnosis not present

## 2021-11-10 DIAGNOSIS — M9901 Segmental and somatic dysfunction of cervical region: Secondary | ICD-10-CM | POA: Diagnosis not present

## 2021-11-10 DIAGNOSIS — M542 Cervicalgia: Secondary | ICD-10-CM | POA: Diagnosis not present

## 2021-11-10 DIAGNOSIS — M5031 Other cervical disc degeneration,  high cervical region: Secondary | ICD-10-CM | POA: Diagnosis not present

## 2021-11-10 DIAGNOSIS — M9902 Segmental and somatic dysfunction of thoracic region: Secondary | ICD-10-CM | POA: Diagnosis not present

## 2021-11-10 DIAGNOSIS — M50321 Other cervical disc degeneration at C4-C5 level: Secondary | ICD-10-CM | POA: Diagnosis not present

## 2021-11-14 ENCOUNTER — Ambulatory Visit (INDEPENDENT_AMBULATORY_CARE_PROVIDER_SITE_OTHER): Payer: PPO | Admitting: Internal Medicine

## 2021-11-14 ENCOUNTER — Encounter: Payer: Self-pay | Admitting: Internal Medicine

## 2021-11-14 VITALS — BP 120/78 | Temp 97.8°F | Ht 64.5 in | Wt 135.2 lb

## 2021-11-14 DIAGNOSIS — Z0000 Encounter for general adult medical examination without abnormal findings: Secondary | ICD-10-CM | POA: Diagnosis not present

## 2021-11-14 DIAGNOSIS — I10 Essential (primary) hypertension: Secondary | ICD-10-CM | POA: Diagnosis not present

## 2021-11-14 DIAGNOSIS — Z23 Encounter for immunization: Secondary | ICD-10-CM

## 2021-11-14 DIAGNOSIS — E032 Hypothyroidism due to medicaments and other exogenous substances: Secondary | ICD-10-CM

## 2021-11-14 DIAGNOSIS — E785 Hyperlipidemia, unspecified: Secondary | ICD-10-CM

## 2021-11-14 LAB — CBC WITH DIFFERENTIAL/PLATELET
Basophils Absolute: 0.1 10*3/uL (ref 0.0–0.1)
Basophils Relative: 1 % (ref 0.0–3.0)
Eosinophils Absolute: 0.1 10*3/uL (ref 0.0–0.7)
Eosinophils Relative: 1.6 % (ref 0.0–5.0)
HCT: 35.3 % — ABNORMAL LOW (ref 36.0–46.0)
Hemoglobin: 11.9 g/dL — ABNORMAL LOW (ref 12.0–15.0)
Lymphocytes Relative: 33.6 % (ref 12.0–46.0)
Lymphs Abs: 1.8 10*3/uL (ref 0.7–4.0)
MCHC: 33.5 g/dL (ref 30.0–36.0)
MCV: 89.7 fl (ref 78.0–100.0)
Monocytes Absolute: 0.5 10*3/uL (ref 0.1–1.0)
Monocytes Relative: 10.1 % (ref 3.0–12.0)
Neutro Abs: 2.8 10*3/uL (ref 1.4–7.7)
Neutrophils Relative %: 53.7 % (ref 43.0–77.0)
Platelets: 235 10*3/uL (ref 150.0–400.0)
RBC: 3.94 Mil/uL (ref 3.87–5.11)
RDW: 14.4 % (ref 11.5–15.5)
WBC: 5.3 10*3/uL (ref 4.0–10.5)

## 2021-11-14 LAB — COMPREHENSIVE METABOLIC PANEL
ALT: 18 U/L (ref 0–35)
AST: 25 U/L (ref 0–37)
Albumin: 4.4 g/dL (ref 3.5–5.2)
Alkaline Phosphatase: 65 U/L (ref 39–117)
BUN: 26 mg/dL — ABNORMAL HIGH (ref 6–23)
CO2: 24 mEq/L (ref 19–32)
Calcium: 10.1 mg/dL (ref 8.4–10.5)
Chloride: 102 mEq/L (ref 96–112)
Creatinine, Ser: 1.03 mg/dL (ref 0.40–1.20)
GFR: 55.24 mL/min — ABNORMAL LOW (ref >60.00)
Glucose, Bld: 84 mg/dL (ref 70–99)
Potassium: 4.5 mEq/L (ref 3.5–5.1)
Sodium: 137 mEq/L (ref 135–145)
Total Bilirubin: 0.7 mg/dL (ref 0.2–1.2)
Total Protein: 7.2 g/dL (ref 6.0–8.3)

## 2021-11-14 LAB — LIPID PANEL
Cholesterol: 266 mg/dL — ABNORMAL HIGH (ref 0–200)
HDL: 59.9 mg/dL (ref >39.00)
LDL Cholesterol: 180 mg/dL — ABNORMAL HIGH (ref 0–99)
NonHDL: 205.92
Total CHOL/HDL Ratio: 4
Triglycerides: 128 mg/dL (ref 0.0–149.0)
VLDL: 25.6 mg/dL (ref 0.0–40.0)

## 2021-11-14 LAB — VITAMIN D 25 HYDROXY (VIT D DEFICIENCY, FRACTURES): VITD: 46.5 ng/mL (ref 30.00–100.00)

## 2021-11-14 LAB — VITAMIN B12: Vitamin B-12: 1278 pg/mL — ABNORMAL HIGH (ref 211–911)

## 2021-11-14 LAB — TSH: TSH: 0.48 u[IU]/mL (ref 0.35–5.50)

## 2021-11-14 LAB — HEMOGLOBIN A1C: Hgb A1c MFr Bld: 5.7 % (ref 4.6–6.5)

## 2021-11-14 NOTE — Progress Notes (Signed)
Established Patient Office Visit     CC/Reason for Visit: Annual preventive exam and subsequent Medicare wellness visit  HPI: Cassandra Mcfarland is a 71 y.o. female who is coming in today for the above mentioned reasons. Past Medical History is significant for: Multinodular goiter status post total thyroidectomy now with iatrogenic hypothyroidism, hypertension, hyperlipidemia, depression, insomnia.  She recently has been dealing with neck issues.  She had an MRI that only showed mild degenerative disc disease.  She has been seeing a chiropractor with significant movement.  She requesting a flu vaccine.  She has routine eye and dental care.   Past Medical/Surgical History: Past Medical History:  Diagnosis Date   Anemia    after knee surgery ( only time)   Anxiety    Complication of anesthesia    Depression    History of urinary tract infection    Hypertension    no longer on medications  since knee surgery and pain resolved   Osteoarthritis    PONV (postoperative nausea and vomiting)    Situational anxiety 09/2003   Situational depression    Thyroid disease    hyper parathyroidism    Past Surgical History:  Procedure Laterality Date   abddominoplasty  1995   APPENDECTOMY  Age 89   BREAST REDUCTION SURGERY  09/2001   & Lift   COLONOSCOPY     EYE SURGERY     cataract bil   ORIF ELBOW FRACTURE Left 03/15/2017   Procedure: OPEN REDUCTION INTERNAL FIXATION (ORIF) ELBOW/OLECRANON FRACTURE;  Surgeon: Altamese Gordon, MD;  Location: [facility];  Service: Orthopedics;  Laterality: Left;   PARATHYROIDECTOMY  5/1/112   due to hypercalcemia   SHOULDER SURGERY     left rotator cuff; bicep tendon repair on left    THUMB FUSION  08/2007   with bone graft- post trauma Dr. Loney Loh   THYROIDECTOMY N/A 10/23/2017   Procedure: TOTAL THYROIDECTOMY;  Surgeon: Armandina Gemma, MD;  Location: [facility];  Service: General;  Laterality: N/A;   toatal knee replacement Right 2001   TOTAL KNEE ARTHROPLASTY  Left 08/02/2015   Procedure: LEFT TOTAL KNEE ARTHROPLASTY;  Surgeon: Gaynelle Arabian, MD;  Location: [facility];  Service: Orthopedics;  Laterality: Left;   TOTAL THYROIDECTOMY     [facility]   TUBAL LIGATION  1994    Social History:  reports that she has never smoked. She has never used smokeless tobacco. She reports current alcohol use. She reports that she does not use drugs.  Allergies: Allergies  Allergen Reactions   Ciprofloxacin Dermatitis, Other (See Comments) and Rash    Severe burning, blisters on toes   Tetanus-Diphtheria Toxoids Td Swelling    SWELLING REACTION UNSPECIFIED  [LOCAL ? Vs SYSTEMIC ?]   Codeine Nausea And Vomiting   Methimazole Itching and Rash    Blisters    Family History:  Family History  Problem Relation Age of Onset   Coronary artery disease Father    Heart disease Father    Heart attack Father    Coronary artery disease Brother    Hypertension Brother    Cancer Mother    Leukemia Mother 34   Osteoporosis Mother    Hypertension Sister    Osteoporosis Sister    Hypertension Brother    Osteoporosis Maternal Aunt      Current Outpatient Medications:    ALPRAZolam (XANAX) 0.5 MG tablet, TAKE 1 TABLET (0.5 MG TOTAL) BY MOUTH AT BEDTIME AS NEEDED., Disp: 90 tablet, Rfl: 0  Cholecalciferol (VITAMIN D) 50 MCG (2000 UT) tablet, Take 2,000 Units by mouth daily., Disp: , Rfl:    levothyroxine (SYNTHROID) 88 MCG tablet, levothyroxine 88 mcg tablet, Disp: , Rfl:    lisinopril (ZESTRIL) 10 MG tablet, TAKE 1 TABLET BY MOUTH EVERY DAY, Disp: 90 tablet, Rfl: 1   alendronate (FOSAMAX) 70 MG tablet, Take 1 tablet (70 mg total) by mouth every 7 (seven) days. Take with a full glass of water on an empty stomach. (Patient not taking: Reported on 11/14/2021), Disp: 4 tablet, Rfl: 11   amoxicillin-clavulanate (AUGMENTIN) 875-125 MG tablet, Take 1 tablet by mouth every 12 (twelve) hours. (Patient not taking: Reported on 11/14/2021), Disp: 14 tablet, Rfl: 0   escitalopram  (LEXAPRO) 10 MG tablet, TAKE 1 TABLET BY MOUTH EVERY DAY (Patient not taking: Reported on 11/14/2021), Disp: 90 tablet, Rfl: 1   Estradiol (VAGIFEM) 10 MCG TABS vaginal tablet, Place 1 tablet (10 mcg total) vaginally 2 (two) times a week. On Sunday and Thursday (Patient not taking: Reported on 11/14/2021), Disp: 18 tablet, Rfl: 11   ezetimibe (ZETIA) 10 MG tablet, TAKE 1 TABLET(10 MG) BY MOUTH DAILY (Patient not taking: Reported on 11/14/2021), Disp: 90 tablet, Rfl: 1   Multiple Vitamin (MULTIVITAMIN ADULT PO), Take by mouth. (Patient not taking: Reported on 11/14/2021), Disp: , Rfl:   Current Facility-Administered Medications:    ipratropium-albuterol (DUONEB) 0.5-2.5 (3) MG/3ML nebulizer solution 3 mL, 3 mL, Nebulization, Once, Nafziger, Cory, NP  Review of Systems:  Constitutional: Denies fever, chills, diaphoresis, appetite change and fatigue.  HEENT: Denies photophobia, eye pain, redness, hearing loss, ear pain, congestion, sore throat, rhinorrhea, sneezing, mouth sores, trouble swallowing, neck pain, neck stiffness and tinnitus.   Respiratory: Denies SOB, DOE, cough, chest tightness,  and wheezing.   Cardiovascular: Denies chest pain, palpitations and leg swelling.  Gastrointestinal: Denies nausea, vomiting, abdominal pain, diarrhea, constipation, blood in stool and abdominal distention.  Genitourinary: Denies dysuria, urgency, frequency, hematuria, flank pain and difficulty urinating.  Endocrine: Denies: hot or cold intolerance, sweats, changes in hair or nails, polyuria, polydipsia. Musculoskeletal: Denies myalgias, back pain, joint swelling, arthralgias and gait problem.  Skin: Denies pallor, rash and wound.  Neurological: Denies dizziness, seizures, syncope, weakness, light-headedness, numbness and headaches.  Hematological: Denies adenopathy. Easy bruising, personal or family bleeding history  Psychiatric/Behavioral: Denies suicidal ideation, mood changes, confusion, nervousness, sleep  disturbance and agitation    Physical Exam: Vitals:   11/14/21 0931  BP: 120/78  Temp: 97.8 F (36.6 C)  Weight: 135 lb 3.2 oz (61.3 kg)  Height: 5' 4.5" (1.638 m)    Body mass index is 22.85 kg/m.   Constitutional: NAD, calm, comfortable Eyes: PERRL, lids and conjunctivae normal ENMT: Mucous membranes are moist. Posterior pharynx clear of any exudate or lesions. Normal dentition. Tympanic membrane is pearly white, no erythema or bulging. Neck: normal, supple, no masses, no thyromegaly Respiratory: clear to auscultation bilaterally, no wheezing, no crackles. Normal respiratory effort. No accessory muscle use.  Cardiovascular: Regular rate and rhythm, no murmurs / rubs / gallops. No extremity edema. 2+ pedal pulses. No carotid bruits.  Abdomen: no tenderness, no masses palpated. No hepatosplenomegaly. Bowel sounds positive.  Musculoskeletal: no clubbing / cyanosis. No joint deformity upper and lower extremities. Good ROM, no contractures. Normal muscle tone.  Skin: no rashes, lesions, ulcers. No induration Neurologic: CN 2-12 grossly intact. Sensation intact, DTR normal. Strength 5/5 in all 4.  Psychiatric: Normal judgment and insight. Alert and oriented x 3. Normal mood.  Subsequent Medicare wellness visit   1. Risk factors, based on past  M,S,F -cardiovascular disease risk factors include age, history of hypertension   2.  Physical activities: Walks on a daily basis and does aerobic exercises twice a week   3.  Depression/mood: History of depression but mood is stable   4.  Hearing: No perceived issues   5.  ADL's: Independent in all ADLs   6.  Fall risk: Low fall risk   7.  Home safety: No problems identified   8.  Height weight, and visual acuity: height and weight as above, vision:  Vision Screening   Right eye Left eye Both eyes  Without correction '20/20 20/25 20/20 '$  With correction        9.  Counseling: Advised to update immunization status   10. Lab  orders based on risk factors: Laboratory update will be reviewed   11. Referral : None today   12. Care plan: Follow-up with me in 6 months   13. Cognitive assessment: No cognitive impairment   14. Screening: Patient provided with a written and personalized 5-10 year screening schedule in the AVS. yes   15. Provider List Update: PCP only  16. Advance Directives: Full code   17. Opioids: Patient is not on any opioid prescriptions and has no risk factors for a substance use disorder.   [facility] Office Visit from 11/14/2021 in [facility]  PHQ-9 Total Score 4          09/10/2018    9:15 AM 10/02/2019    3:48 PM 10/05/2020   10:55 AM 12/08/2020    2:35 PM 11/14/2021    9:38 AM  Fall Risk  Falls in the past year? 1 0 0  0  Number of falls in past year - Comments  missed a step     Was there an injury with Fall? 1 0 0  0  Fall Risk Category Calculator 2 0 0  0  Fall Risk Category Moderate Low Low  Low  Patient Fall Risk Level    Low fall risk Low fall risk  Patient at Risk for Falls Due to     No Fall Risks  Fall risk Follow up     Falls evaluation completed     Impression and Plan:  Encounter for preventive health examination - Plan: Hemoglobin A1c, Vitamin B12, VITAMIN D 25 Hydroxy (Vit-D Deficiency, Fractures)  Dyslipidemia - Plan: Lipid panel  Primary hypertension - Plan: CBC with Differential/Platelet, Comprehensive metabolic panel  Iatrogenic hypothyroidism - Plan: TSH  Need for influenza vaccination  -Recommend routine eye and dental care. -Immunizations: Flu vaccine today, she is due for shingles and she will consider obtaining at pharmacy -Healthy lifestyle discussed in detail. -Labs to be updated today. -Colon cancer screening: 11/2016 -Breast cancer screening: 11/2020 -Cervical cancer screening: Need to discuss next visit -Lung cancer screening: Not applicable -Prostate cancer screening: Not applicable -DEXA: DEXA 0/9470 that  showed osteoporosis, she did not tolerate alendronate, is considering Prolia.     Lelon Frohlich, MD [facility]

## 2021-11-17 ENCOUNTER — Encounter: Payer: Self-pay | Admitting: Internal Medicine

## 2021-11-17 DIAGNOSIS — E785 Hyperlipidemia, unspecified: Secondary | ICD-10-CM

## 2021-11-19 ENCOUNTER — Encounter: Payer: Self-pay | Admitting: Internal Medicine

## 2021-11-21 ENCOUNTER — Other Ambulatory Visit: Payer: Self-pay | Admitting: *Deleted

## 2021-11-21 DIAGNOSIS — E785 Hyperlipidemia, unspecified: Secondary | ICD-10-CM

## 2022-01-16 ENCOUNTER — Other Ambulatory Visit: Payer: Self-pay | Admitting: Internal Medicine

## 2022-01-16 DIAGNOSIS — Z1231 Encounter for screening mammogram for malignant neoplasm of breast: Secondary | ICD-10-CM

## 2022-01-17 ENCOUNTER — Ambulatory Visit: Payer: PPO

## 2022-01-20 ENCOUNTER — Other Ambulatory Visit: Payer: Self-pay | Admitting: Internal Medicine

## 2022-01-20 DIAGNOSIS — I10 Essential (primary) hypertension: Secondary | ICD-10-CM

## 2022-02-02 ENCOUNTER — Other Ambulatory Visit: Payer: Self-pay | Admitting: Internal Medicine

## 2022-02-02 DIAGNOSIS — F419 Anxiety disorder, unspecified: Secondary | ICD-10-CM

## 2022-02-06 ENCOUNTER — Encounter: Payer: Self-pay | Admitting: Internal Medicine

## 2022-03-28 ENCOUNTER — Ambulatory Visit
Admission: RE | Admit: 2022-03-28 | Discharge: 2022-03-28 | Disposition: A | Discharge status: Home / Self Care | Payer: PPO | Source: Ambulatory Visit | Attending: Internal Medicine | Admitting: Internal Medicine

## 2022-03-28 DIAGNOSIS — Z1231 Encounter for screening mammogram for malignant neoplasm of breast: Secondary | ICD-10-CM

## 2022-04-18 DIAGNOSIS — H04123 Dry eye syndrome of bilateral lacrimal glands: Secondary | ICD-10-CM | POA: Diagnosis not present

## 2022-04-18 DIAGNOSIS — H35371 Puckering of macula, right eye: Secondary | ICD-10-CM | POA: Diagnosis not present

## 2022-04-18 DIAGNOSIS — Z961 Presence of intraocular lens: Secondary | ICD-10-CM | POA: Diagnosis not present

## 2022-04-18 DIAGNOSIS — H43812 Vitreous degeneration, left eye: Secondary | ICD-10-CM | POA: Diagnosis not present

## 2022-05-04 ENCOUNTER — Other Ambulatory Visit: Payer: Self-pay | Admitting: Internal Medicine

## 2022-05-04 DIAGNOSIS — F419 Anxiety disorder, unspecified: Secondary | ICD-10-CM

## 2022-05-12 DIAGNOSIS — E890 Postprocedural hypothyroidism: Secondary | ICD-10-CM | POA: Diagnosis not present

## 2022-05-12 DIAGNOSIS — E213 Hyperparathyroidism, unspecified: Secondary | ICD-10-CM | POA: Diagnosis not present

## 2022-05-12 DIAGNOSIS — C73 Malignant neoplasm of thyroid gland: Secondary | ICD-10-CM | POA: Diagnosis not present

## 2022-05-15 ENCOUNTER — Ambulatory Visit (INDEPENDENT_AMBULATORY_CARE_PROVIDER_SITE_OTHER): Payer: PPO | Admitting: Family Medicine

## 2022-05-15 ENCOUNTER — Ambulatory Visit: Payer: PPO | Admitting: Family Medicine

## 2022-05-15 VITALS — BP 135/88 | HR 72 | Temp 98.1°F | Resp 16 | Ht 64.0 in | Wt 138.6 lb

## 2022-05-15 DIAGNOSIS — F320 Major depressive disorder, single episode, mild: Secondary | ICD-10-CM

## 2022-05-15 DIAGNOSIS — E785 Hyperlipidemia, unspecified: Secondary | ICD-10-CM

## 2022-05-15 DIAGNOSIS — Z7689 Persons encountering health services in other specified circumstances: Secondary | ICD-10-CM

## 2022-05-15 DIAGNOSIS — E0590 Thyrotoxicosis, unspecified without thyrotoxic crisis or storm: Secondary | ICD-10-CM | POA: Diagnosis not present

## 2022-05-15 DIAGNOSIS — I10 Essential (primary) hypertension: Secondary | ICD-10-CM

## 2022-05-15 MED ORDER — EZETIMIBE 10 MG PO TABS: 10.0000 mg | ORAL_TABLET | Freq: Every day | ORAL | 1 refills | Status: DC

## 2022-05-15 NOTE — Progress Notes (Signed)
New Patient Office Visit  Subjective    Patient ID: Cassandra Mcfarland, female    DOB: 02/22/1951  Age: 72 y.o. MRN: [id number]  CC:  Chief Complaint  Patient presents with   Establish Care    HPI [name] presents to establish care ***  Outpatient Encounter Medications as of 05/15/2022  Medication Sig   lisinopril (ZESTRIL) 10 MG tablet Take 10 mg by mouth daily.   Multiple Vitamin (MULTIVITAMIN) capsule Take by mouth.   ALPRAZolam (XANAX) 0.5 MG tablet TAKE 1 TABLET (0.5 MG TOTAL) BY MOUTH AT BEDTIME AS NEEDED.   Cholecalciferol (VITAMIN D) 50 MCG (2000 UT) tablet Take 2,000 Units by mouth daily.   Cyanocobalamin (VITAMIN B12) 1000 MCG TBCR 1 tablet Orally Once a day for 30 day(s)   levothyroxine (SYNTHROID) 88 MCG tablet levothyroxine 88 mcg tablet   Multiple Vitamins-Minerals (MULTI FOR HER 50+) TABS as directed Orally   [DISCONTINUED] alendronate (FOSAMAX) 70 MG tablet Take 1 tablet (70 mg total) by mouth every 7 (seven) days. Take with a full glass of water on an empty stomach. (Patient not taking: Reported on 11/14/2021)   [DISCONTINUED] amoxicillin-clavulanate (AUGMENTIN) 875-125 MG tablet Take 1 tablet by mouth every 12 (twelve) hours. (Patient not taking: Reported on 11/14/2021)   [DISCONTINUED] escitalopram (LEXAPRO) 10 MG tablet TAKE 1 TABLET BY MOUTH EVERY DAY (Patient not taking: Reported on 11/14/2021)   [DISCONTINUED] Estradiol (VAGIFEM) 10 MCG TABS vaginal tablet Place 1 tablet (10 mcg total) vaginally 2 (two) times a week. On Sunday and Thursday (Patient not taking: Reported on 11/14/2021)   [DISCONTINUED] ezetimibe (ZETIA) 10 MG tablet TAKE 1 TABLET(10 MG) BY MOUTH DAILY (Patient not taking: Reported on 11/14/2021)   [DISCONTINUED] lisinopril (ZESTRIL) 10 MG tablet TAKE 1 TABLET BY MOUTH EVERY DAY   [DISCONTINUED] Multiple Vitamin (MULTIVITAMIN ADULT PO) Take by mouth. (Patient not taking: Reported on 11/14/2021)   Facility-Administered Encounter Medications as of  05/15/2022  Medication   ipratropium-albuterol (DUONEB) 0.5-2.5 (3) MG/3ML nebulizer solution 3 mL    Past Medical History:  Diagnosis Date   Anemia    after knee surgery ( only time)   Anxiety    Complication of anesthesia    Depression    History of urinary tract infection    Hypertension    no longer on medications  since knee surgery and pain resolved   Osteoarthritis    PONV (postoperative nausea and vomiting)    Situational anxiety 09/2003   Situational depression    Thyroid disease    hyper parathyroidism    Past Surgical History:  Procedure Laterality Date   abddominoplasty  1995   APPENDECTOMY  Age 64   BREAST REDUCTION SURGERY  09/2001   & Lift   COLONOSCOPY     EYE SURGERY     cataract bil   ORIF ELBOW FRACTURE Left 03/15/2017   Procedure: OPEN REDUCTION INTERNAL FIXATION (ORIF) ELBOW/OLECRANON FRACTURE;  Surgeon: Altamese Glenwood, MD;  Location: [facility];  Service: Orthopedics;  Laterality: Left;   PARATHYROIDECTOMY  5/1/112   due to hypercalcemia   SHOULDER SURGERY     left rotator cuff; bicep tendon repair on left    THUMB FUSION  08/2007   with bone graft- post trauma Dr. Loney Loh   THYROIDECTOMY N/A 10/23/2017   Procedure: TOTAL THYROIDECTOMY;  Surgeon: Armandina Gemma, MD;  Location: [facility];  Service: General;  Laterality: N/A;   toatal knee replacement Right 2001   TOTAL KNEE ARTHROPLASTY Left 08/02/2015  Procedure: LEFT TOTAL KNEE ARTHROPLASTY;  Surgeon: Gaynelle Arabian, MD;  Location: [facility];  Service: Orthopedics;  Laterality: Left;   TOTAL THYROIDECTOMY     [facility]   TUBAL LIGATION  1994    Family History  Problem Relation Age of Onset   Coronary artery disease Father    Heart disease Father    Heart attack Father    Coronary artery disease Brother    Hypertension Brother    Cancer Mother    Leukemia Mother 49   Osteoporosis Mother    Hypertension Sister    Osteoporosis Sister    Hypertension Brother    Osteoporosis Maternal Aunt     Social  History   Socioeconomic History   Marital status: Married    Spouse name: Not on file   Number of children: Not on file   Years of education: Not on file   Highest education level: Not on file  Occupational History   Not on file  Tobacco Use   Smoking status: Never   Smokeless tobacco: Never  Vaping Use   Vaping Use: Never used  Substance and Sexual Activity   Alcohol use: Yes    Comment: occas glass of wine    Drug use: No   Sexual activity: Yes    Partners: Male    Birth control/protection: Post-menopausal  Other Topics Concern   Not on file  Social History Narrative   Not on file   Social Determinants of Health   Financial Resource Strain: Not on file  Food Insecurity: Not on file  Transportation Needs: Not on file  Physical Activity: Not on file  Stress: Not on file  Social Connections: Not on file  Intimate Partner Violence: Not on file    ROS      Objective    BP 135/88   Pulse 72   Temp 98.1 F (36.7 C) (Oral)   Resp 16   Ht 5\' 4"  (1.626 m)   Wt 138 lb 9.6 oz (62.9 kg)   LMP 11/10/2014 (Exact Date)   SpO2 98%   BMI 23.79 kg/m   Physical Exam  {Labs (Optional):23779}    Assessment & Plan:   1. Hyperlipidemia, unspecified hyperlipidemia type Will start zetia and monitor  2. Essential hypertension Appears stable. Continue  3. Hyperthyroidism Appears stable. Management as per consultant.   4. Current mild episode of major depressive disorder without prior episode (HCC) Appears stable. Continue   5. Encounter to establish care     No follow-ups on file.   Becky Sax, MD

## 2022-05-15 NOTE — Progress Notes (Unsigned)
Patient is here to established care with provider today. Patient has many health concern they would like to discuss with provider today  Care gaps discuss at appointment today  

## 2022-05-17 ENCOUNTER — Encounter: Payer: Self-pay | Admitting: Family Medicine

## 2022-05-19 DIAGNOSIS — E213 Hyperparathyroidism, unspecified: Secondary | ICD-10-CM | POA: Diagnosis not present

## 2022-05-19 DIAGNOSIS — M8580 Other specified disorders of bone density and structure, unspecified site: Secondary | ICD-10-CM | POA: Diagnosis not present

## 2022-05-19 DIAGNOSIS — E890 Postprocedural hypothyroidism: Secondary | ICD-10-CM | POA: Diagnosis not present

## 2022-05-19 DIAGNOSIS — I10 Essential (primary) hypertension: Secondary | ICD-10-CM | POA: Diagnosis not present

## 2022-05-19 DIAGNOSIS — C73 Malignant neoplasm of thyroid gland: Secondary | ICD-10-CM | POA: Diagnosis not present

## 2022-06-30 DIAGNOSIS — E890 Postprocedural hypothyroidism: Secondary | ICD-10-CM | POA: Diagnosis not present

## 2022-07-08 ENCOUNTER — Other Ambulatory Visit: Payer: Self-pay | Admitting: Internal Medicine

## 2022-07-08 DIAGNOSIS — F419 Anxiety disorder, unspecified: Secondary | ICD-10-CM

## 2022-08-04 ENCOUNTER — Other Ambulatory Visit: Payer: Self-pay | Admitting: Internal Medicine

## 2022-08-04 DIAGNOSIS — F419 Anxiety disorder, unspecified: Secondary | ICD-10-CM

## 2022-08-08 NOTE — Telephone Encounter (Signed)
Alprazolam has never been prescribed by Georganna Skeans, MD. Please schedule appointment. Thank you.

## 2022-08-15 DIAGNOSIS — E890 Postprocedural hypothyroidism: Secondary | ICD-10-CM | POA: Diagnosis not present

## 2022-09-05 ENCOUNTER — Telehealth: Payer: Self-pay | Admitting: Family

## 2022-09-05 ENCOUNTER — Encounter: Payer: PPO | Admitting: Family

## 2022-09-05 ENCOUNTER — Ambulatory Visit: Payer: Self-pay | Admitting: *Deleted

## 2022-09-05 NOTE — Telephone Encounter (Signed)
Summary: Head Pain, Covid   Head pain, don't feel well .Marland Kitchen Was dx with covid yesterday. Husband stated she did not want to come in to an appointment       Reason for Disposition  [1] HIGH RISK patient (e.g., weak immune system, age > 64 years, obesity with BMI 30 or higher, pregnant, chronic lung disease or other chronic medical condition) AND [2] COVID symptoms (e.g., cough, fever)  (Exceptions: Already seen by PCP and no new or worsening symptoms.)  Answer Assessment - Initial Assessment Questions 1. COVID-19 DIAGNOSIS: "How do you know that you have COVID?" (e.g., positive lab test or self-test, diagnosed by doctor or NP/PA, symptoms after exposure).     + COVID home test today 2. COVID-19 EXPOSURE: "Was there any known exposure to COVID before the symptoms began?" CDC Definition of close contact: within 6 feet (2 meters) for a total of 15 minutes or more over a 24-hour period.      Not sure 3. ONSET: "When did the COVID-19 symptoms start?"      Sunday night sore throat- yesterday headache, fever 4. WORST SYMPTOM: "What is your worst symptom?" (e.g., cough, fever, shortness of breath, muscle aches)     headache 5. COUGH: "Do you have a cough?" If Yes, ask: "How bad is the cough?"       Little bit 6. FEVER: "Do you have a fever?" If Yes, ask: "What is your temperature, how was it measured, and when did it start?"     Fever yesterday- did not check- hot, chills 7. RESPIRATORY STATUS: "Describe your breathing?" (e.g., normal; shortness of breath, wheezing, unable to speak)      Ok 8. BETTER-SAME-WORSE: "Are you getting better, staying the same or getting worse compared to yesterday?"  If getting worse, ask, "In what way?"     Feels worse- but fever gone 9. OTHER SYMPTOMS: "Do you have any other symptoms?"  (e.g., chills, fatigue, headache, loss of smell or taste, muscle pain, sore throat)     Chills- yesterday, fatigue, left ear pain- shooting pain 10. HIGH RISK DISEASE: "Do you have any  chronic medical problems?" (e.g., asthma, heart or lung disease, weak immune system, obesity, etc.)       No- age  Protocols used: Coronavirus (COVID-19) Diagnosed or Suspected-A-AH

## 2022-09-05 NOTE — Telephone Encounter (Signed)
At 1610 I connected to video visit. Patient was not present. If I can assist patient in any way please update me. Thank you.

## 2022-09-05 NOTE — Telephone Encounter (Signed)
  Chief Complaint: + COVID Symptoms: severe headache, fatigue,fever,cough, sore throat- some symptoms present- some are gone- but were present yesterday Frequency: symptoms started Sunday night Pertinent Negatives: Patient denies fever today, SOB Disposition: [] ED /[] Urgent Care (no appt availability in office) / [x] Appointment(In office/virtual)/ []  [facility]/ [] Home Care/ [] Refused Recommended Disposition /[] [facility]/ []  Follow-up with PCP Additional Notes: Patient is calling to report she has severe headache with + COVID test- protocol for COVID treatment/isolation reviewed-MyChart appointment scheduled. Office notified.

## 2022-09-05 NOTE — Progress Notes (Signed)
At 1610 I connected to video visit. Patient was not present.

## 2022-09-05 NOTE — Telephone Encounter (Signed)
Patient called irate stating that she was logged into her virtual appt and was not seen.I offered her the option of having another provider give her call so that she could still have her appointment. She said no and that she does not want to be charged for the visit . She hung up and said she will find another provider. Please advise if there is anything else that I need to do?

## 2022-09-25 ENCOUNTER — Other Ambulatory Visit: Payer: Self-pay | Admitting: Family Medicine

## 2022-09-25 ENCOUNTER — Other Ambulatory Visit: Payer: Self-pay | Admitting: Internal Medicine

## 2022-09-25 DIAGNOSIS — I10 Essential (primary) hypertension: Secondary | ICD-10-CM

## 2022-09-25 NOTE — Telephone Encounter (Deleted)
This is a historical med.  Okay to refill? °

## 2022-09-26 NOTE — Telephone Encounter (Signed)
Requested medication (s) are due for refill today: routing for review  Requested medication (s) are on the active medication list: yes  Last refill:  05/15/22  Future visit scheduled: yes  Notes to clinic:  Unable to refill per protocol, last refill by historical provider.  Medication was refused due to no longer under a patient at [facility], but patient has future OV scheduled with Dr. Andrey Campanile. Routing for review.     Requested Prescriptions  Pending Prescriptions Disp Refills   lisinopril (ZESTRIL) 10 MG tablet [Pharmacy Med Name: LISINOPRIL 10 MG TABLET] 90 tablet 1    Sig: TAKE 1 TABLET BY MOUTH EVERY DAY     Cardiovascular:  ACE Inhibitors Failed - 09/25/2022  1:11 PM      Failed - Cr in normal range and within 180 days    Creat  Date Value Ref Range Status  10/03/2019 0.86 0.50 - 0.99 mg/dL Final    Comment:    For patients >11 years of age, the reference limit for Creatinine is approximately 13% higher for people identified as African-American. .    Creatinine, Ser  Date Value Ref Range Status  11/14/2021 1.03 0.40 - 1.20 mg/dL Final         Failed - K in normal range and within 180 days    Potassium  Date Value Ref Range Status  11/14/2021 4.5 3.5 - 5.1 mEq/L Final         Passed - Patient is not pregnant      Passed - Last BP in normal range    BP Readings from Last 1 Encounters:  05/15/22 135/88         Passed - Valid encounter within last 6 months    Recent Outpatient Visits           4 months ago Essential hypertension    [facility] at [facility], MD       Future Appointments             In 1 month Georganna Skeans, MD [facility]

## 2022-09-27 ENCOUNTER — Other Ambulatory Visit: Payer: Self-pay | Admitting: *Deleted

## 2022-09-27 MED ORDER — LISINOPRIL 10 MG PO TABS: 10.0000 mg | ORAL_TABLET | Freq: Every day | ORAL | 0 refills | Status: DC

## 2022-10-10 NOTE — Telephone Encounter (Signed)
I called the patient to follow up on the missed appointment on 09/05/22. Patient was contacted for further assistance and to advised to reschedule. Patient stated that she did not want to reschedule and she would find another provider .

## 2022-11-10 ENCOUNTER — Other Ambulatory Visit: Payer: Self-pay | Admitting: Family Medicine

## 2022-11-13 DIAGNOSIS — M79672 Pain in left foot: Secondary | ICD-10-CM | POA: Diagnosis not present

## 2022-11-15 ENCOUNTER — Ambulatory Visit (INDEPENDENT_AMBULATORY_CARE_PROVIDER_SITE_OTHER): Payer: PPO

## 2022-11-15 DIAGNOSIS — Z0000 Encounter for general adult medical examination without abnormal findings: Secondary | ICD-10-CM | POA: Diagnosis not present

## 2022-11-15 NOTE — Patient Instructions (Addendum)
Cassandra Mcfarland , Thank you for taking time to come for your Medicare Wellness Visit. I appreciate your ongoing commitment to your health goals. Please review the following plan we discussed and let me know if I can assist you in the future.   Referrals/Orders/Follow-Ups/Clinician Recommendations: none  This is a list of the screening recommended for you and due dates:  Health Maintenance  Topic Date Due   Zoster (Shingles) Vaccine (1 of 2) Never done   COVID-19 Vaccine (9 - 2023-24 season) 01/03/2023   Medicare Annual Wellness Visit  11/15/2023   Mammogram  03/28/2024   DTaP/Tdap/Td vaccine (2 - Td or Tdap) 02/27/2026   Colon Cancer Screening  12/14/2026   Pneumonia Vaccine  Completed   Flu Shot  Completed   DEXA scan (bone density measurement)  Completed   Hepatitis C Screening  Completed   HPV Vaccine  Aged Out    Advanced directives: (Copy Requested) Please bring a copy of your health care power of attorney and living will to the office to be added to your chart at your convenience.  Next Medicare Annual Wellness Visit scheduled for next year: No, schedule is not open for next year  Insert Preventive Care attachment Insert FALL PREVENTION attachment if needed

## 2022-11-15 NOTE — Progress Notes (Signed)
Subjective:   Cassandra Mcfarland is a 72 y.o. female who presents for Medicare Annual (Subsequent) preventive examination.  Visit Complete: Virtual  I connected with  [name] on 11/15/22 by a audio enabled telemedicine application and verified that I am speaking with the correct person using two identifiers.  Patient Location: Home  Provider Location: Office/Clinic  I discussed the limitations of evaluation and management by telemedicine. The patient expressed understanding and agreed to proceed.  Vital Signs: Unable to obtain new vitals due to this being a telehealth visit.  Cardiac Risk Factors include: advanced age (>41men, >41 women);dyslipidemia;hypertension     Objective:    Today's Vitals   There is no height or weight on file to calculate BMI.     11/15/2022    2:03 PM 12/08/2020    2:34 PM 06/25/2018    7:31 PM 10/23/2017   10:16 AM 10/23/2017    6:42 AM 10/17/2017    2:26 PM 03/15/2017    5:57 AM  Advanced Directives  Does Patient Have a Medical Advance Directive? Yes No No Yes  Yes Yes  Type of Estate agent of [place];Living will   Healthcare Power of [place];Living will Healthcare Power of [place];Living will Healthcare Power of [place];Living will Healthcare Power of [place];Living will  Does patient want to make changes to medical advance directive?    No - Patient declined No - Patient declined No - Patient declined   Copy of Healthcare Power of Attorney in Chart? No - copy requested   No - copy requested No - copy requested No - copy requested No - copy requested  Would patient like information on creating a medical advance directive?  No - Patient declined No - Patient declined        Current Medications (verified) Outpatient Encounter Medications as of 11/15/2022  Medication Sig   ALPRAZolam (XANAX) 0.5 MG tablet TAKE 1 TABLET (0.5 MG TOTAL) BY MOUTH AT BEDTIME AS NEEDED.   Cyanocobalamin (VITAMIN B12) 1000 MCG TBCR 1 tablet  Orally Once a day for 30 day(s)   ezetimibe (ZETIA) 10 MG tablet TAKE 1 TABLET BY MOUTH EVERY DAY   levothyroxine (SYNTHROID) 88 MCG tablet levothyroxine 88 mcg tablet   lisinopril (ZESTRIL) 10 MG tablet Take 1 tablet (10 mg total) by mouth daily.   Multiple Vitamin (MULTIVITAMIN) capsule Take by mouth.   Multiple Vitamins-Minerals (MULTI FOR HER 50+) TABS as directed Orally   Cholecalciferol (VITAMIN D) 50 MCG (2000 UT) tablet Take 2,000 Units by mouth daily. (Patient not taking: Reported on 11/15/2022)   No facility-administered encounter medications on file as of 11/15/2022.    Allergies (verified) Ciprofloxacin, Tetanus toxoid, Tetanus antitoxin, Tetanus-diphtheria toxoids td, Codeine, and Methimazole   History: Past Medical History:  Diagnosis Date   Anemia    after knee surgery ( only time)   Anxiety    Complication of anesthesia    Depression    History of urinary tract infection    Hypertension    no longer on medications  since knee surgery and pain resolved   Osteoarthritis    PONV (postoperative nausea and vomiting)    Situational anxiety 09/2003   Situational depression    Thyroid disease    hyper parathyroidism   Past Surgical History:  Procedure Laterality Date   abddominoplasty  1995   APPENDECTOMY  Age 77   BREAST REDUCTION SURGERY  09/2001   & Lift   COLONOSCOPY     EYE SURGERY  cataract bil   ORIF ELBOW FRACTURE Left 03/15/2017   Procedure: OPEN REDUCTION INTERNAL FIXATION (ORIF) ELBOW/OLECRANON FRACTURE;  Surgeon: Myrene Galas, MD;  Location: [facility];  Service: Orthopedics;  Laterality: Left;   PARATHYROIDECTOMY  5/1/112   due to hypercalcemia   SHOULDER SURGERY     left rotator cuff; bicep tendon repair on left    THUMB FUSION  08/2007   with bone graft- post trauma Dr. Cyndee Brightly   THYROIDECTOMY N/A 10/23/2017   Procedure: TOTAL THYROIDECTOMY;  Surgeon: Darnell Level, MD;  Location: [facility];  Service: General;  Laterality: N/A;   toatal knee  replacement Right 2001   TOTAL KNEE ARTHROPLASTY Left 08/02/2015   Procedure: LEFT TOTAL KNEE ARTHROPLASTY;  Surgeon: Ollen Gross, MD;  Location: [facility];  Service: Orthopedics;  Laterality: Left;   TOTAL THYROIDECTOMY     Gerkin   TUBAL LIGATION  1994   Family History  Problem Relation Age of Onset   Coronary artery disease Father    Heart disease Father    Heart attack Father    Coronary artery disease Brother    Hypertension Brother    Cancer Mother    Leukemia Mother 30   Osteoporosis Mother    Hypertension Sister    Osteoporosis Sister    Hypertension Brother    Osteoporosis Maternal Aunt    Social History   Socioeconomic History   Marital status: Married    Spouse name: Not on file   Number of children: Not on file   Years of education: Not on file   Highest education level: Not on file  Occupational History   Not on file  Tobacco Use   Smoking status: Never   Smokeless tobacco: Never  Vaping Use   Vaping status: Never Used  Substance and Sexual Activity   Alcohol use: Yes    Comment: occas glass of wine    Drug use: No   Sexual activity: Yes    Partners: Male    Birth control/protection: Post-menopausal  Other Topics Concern   Not on file  Social History Narrative   Not on file   Social Determinants of Health   Financial Resource Strain: Low Risk  (11/15/2022)   Overall Financial Resource Strain (CARDIA)    Difficulty of Paying Living Expenses: Not hard at all  Food Insecurity: No Food Insecurity (11/15/2022)   Hunger Vital Sign    Worried About Running Out of Food in the Last Year: Never true    Ran Out of Food in the Last Year: Never true  Transportation Needs: No Transportation Needs (11/15/2022)   PRAPARE - Administrator, Civil Service (Medical): No    Lack of Transportation (Non-Medical): No  Physical Activity: Sufficiently Active (11/15/2022)   Exercise Vital Sign    Days of Exercise per Week: 7 days    Minutes of Exercise per  Session: 30 min  Stress: No Stress Concern Present (11/15/2022)   [employer] of Occupational Health - Occupational Stress Questionnaire    Feeling of Stress : Only a little  Social Connections: Socially Integrated (11/15/2022)   Social Connection and Isolation Panel [NHANES]    Frequency of Communication with Friends and Family: More than three times a week    Frequency of Social Gatherings with Friends and Family: Twice a week    Attends Religious Services: More than 4 times per year    Active Member of [employer] or Organizations: Yes    Attends Banker Meetings: More  than 4 times per year    Marital Status: Married    Tobacco Counseling Counseling given: Not Answered   Clinical Intake:  Pre-visit preparation completed: Yes  Pain : No/denies pain     Nutritional Risks: None Diabetes: No  How often do you need to have someone help you when you read instructions, pamphlets, or other written materials from your doctor or pharmacy?: 1 - Never  Interpreter Needed?: No  Information entered by :: NAllen LPN   Activities of Daily Living    11/15/2022    1:58 PM  In your present state of health, do you have any difficulty performing the following activities:  Hearing? 0  Vision? 0  Difficulty concentrating or making decisions? 0  Walking or climbing stairs? 0  Dressing or bathing? 0  Doing errands, shopping? 0  Preparing Food and eating ? N  Using the Toilet? N  In the past six months, have you accidently leaked urine? N  Do you have problems with loss of bowel control? N  Managing your Medications? N  Managing your Finances? N  Housekeeping or managing your Housekeeping? N    Patient Care Team: Georganna Skeans, MD as PCP - General (Family Medicine) [facility] Dorisann Frames, MD as Referring Physician (Endocrinology)  Indicate any recent Medical Services you may have received from other than [facility] providers in the past year (date  may be approximate).     Assessment:   This is a routine wellness examination for Lajeana.  Hearing/Vision screen Hearing Screening - Comments:: Denies hearing issues Vision Screening - Comments:: Regular eye exams, [facility]   Goals Addressed             This Visit's Progress    Patient Stated       11/15/2022, wants to lose love handlers       Depression Screen    11/15/2022    2:06 PM 11/14/2021    9:42 AM 11/14/2021    9:39 AM 08/02/2021   11:17 AM 10/05/2020   10:56 AM 05/13/2020   12:54 PM 10/02/2019    3:48 PM  PHQ 2/9 Scores  PHQ - 2 Score 1 2 0 4 0 5 0  PHQ- 9 Score 2 4  7  0 6 0    Fall Risk    11/15/2022    2:05 PM 11/14/2021    9:38 AM 10/05/2020   10:55 AM 10/02/2019    3:48 PM 09/10/2018    9:15 AM  Fall Risk   Falls in the past year? 0 0 0 0 1  Number falls in past yr: 0 0 0 0 0  Comment    missed a step   Injury with Fall? 0 0 0 0 1  Risk for fall due to : Medication side effect No Fall Risks     Follow up Falls prevention discussed;Falls evaluation completed Falls evaluation completed       MEDICARE RISK AT HOME: Medicare Risk at Home Any stairs in or around the home?: Yes If so, are there any without handrails?: No Home free of loose throw rugs in walkways, pet beds, electrical cords, etc?: Yes Adequate lighting in your home to reduce risk of falls?: Yes Life alert?: No Use of a cane, walker or w/c?: No Grab bars in the bathroom?: Yes Shower chair or bench in shower?: No Elevated toilet seat or a handicapped toilet?: Yes  TIMED UP AND GO:  Was the test performed?  No  Cognitive Function:        11/15/2022    2:07 PM  6CIT Screen  What Year? 0 points  What month? 0 points  What time? 0 points  Count back from 20 0 points  Months in reverse 0 points  Repeat phrase 2 points  Total Score 2 points    Immunizations Immunization History  Administered Date(s) Administered   Fluad Quad(high Dose 65+) 11/14/2021   Influenza Split  01/03/2012   Influenza, High Dose Seasonal PF 01/16/2018, 01/10/2019, 11/21/2019, 01/05/2021   Influenza,inj,quad, With Preservative 10/29/2014, 04/10/2017   Influenza-Unspecified 11/11/2014, 12/20/2015, 02/27/2017, 04/10/2017   Moderna SARS-COV2 Booster Vaccination 11/28/2019   Moderna Sars-Covid-2 Vaccination 03/19/2019, 03/31/2019, 04/28/2019, 05/14/2019, 01/27/2020, 04/21/2020   Pfizer Covid-19 Vaccine Bivalent Booster 13yrs & up 01/05/2021   Pneumococcal Conjugate-13 05/14/2017   Pneumococcal Polysaccharide-23 09/10/2018   Tdap 02/28/2016    TDAP status: Up to date  Flu Vaccine status: Up to date  Pneumococcal vaccine status: Up to date  Covid-19 vaccine status: Completed vaccines  Qualifies for Shingles Vaccine? Yes   Zostavax completed No   Shingrix Completed?: No.    Education has been provided regarding the importance of this vaccine. Patient has been advised to call insurance company to determine out of pocket expense if they have not yet received this vaccine. Advised may also receive vaccine at local pharmacy or Health Dept. Verbalized acceptance and understanding.  Screening Tests Health Maintenance  Topic Date Due   Zoster Vaccines- Shingrix (1 of 2) Never done   COVID-19 Vaccine (9 - 2023-24 season) 01/03/2023   Medicare Annual Wellness (AWV)  11/15/2023   MAMMOGRAM  03/28/2024   DTaP/Tdap/Td (2 - Td or Tdap) 02/27/2026   Colonoscopy  12/14/2026   Pneumonia Vaccine 54+ Years old  Completed   INFLUENZA VACCINE  Completed   DEXA SCAN  Completed   Hepatitis C Screening  Completed   HPV VACCINES  Aged Out    Health Maintenance  Health Maintenance Due  Topic Date Due   Zoster Vaccines- Shingrix (1 of 2) Never done    Colorectal cancer screening: Type of screening: Colonoscopy. Completed 12/13/2016. Repeat every 10 years  Mammogram status: Completed 03/28/2022. Repeat every year  Bone Density status: Completed 05/06/2021.   Lung Cancer Screening: (Low Dose  CT Chest recommended if Age 15-80 years, 20 pack-year currently smoking OR have quit w/in 15years.) does not qualify.   Lung Cancer Screening Referral: no  Additional Screening:  Hepatitis C Screening: does qualify; Completed 05/14/2017  Vision Screening: Recommended annual ophthalmology exams for early detection of glaucoma and other disorders of the eye. Is the patient up to date with their annual eye exam?  Yes  Who is the provider or what is the name of the office in which the patient attends annual eye exams? [facility] If pt is not established with a provider, would they like to be referred to a provider to establish care? No .   Dental Screening: Recommended annual dental exams for proper oral hygiene  Diabetic Foot Exam: n/a  Community Resource Referral / Chronic Care Management: CRR required this visit?  No   CCM required this visit?  No     Plan:     I have personally reviewed and noted the following in the patient's chart:   Medical and social history Use of alcohol, tobacco or illicit drugs  Current medications and supplements including opioid prescriptions. Patient is not currently taking opioid prescriptions. Functional ability and status Nutritional status  Physical activity Advanced directives List of other physicians Hospitalizations, surgeries, and ER visits in previous 12 months Vitals Screenings to include cognitive, depression, and falls Referrals and appointments  In addition, I have reviewed and discussed with patient certain preventive protocols, quality metrics, and best practice recommendations. A written personalized care plan for preventive services as well as general preventive health recommendations were provided to patient.     Barb Merino, LPN   [id number]/84/[id number]   After Visit Summary: (MyChart) Due to this being a telephonic visit, the after visit summary with patients personalized plan was offered to patient via MyChart   Nurse Notes:  none

## 2022-11-17 ENCOUNTER — Encounter: Payer: Self-pay | Admitting: Family Medicine

## 2022-11-17 ENCOUNTER — Ambulatory Visit (INDEPENDENT_AMBULATORY_CARE_PROVIDER_SITE_OTHER): Payer: PPO | Admitting: Family Medicine

## 2022-11-17 VITALS — BP 123/87 | HR 75 | Temp 98.1°F | Resp 16 | Ht 64.0 in | Wt 135.0 lb

## 2022-11-17 DIAGNOSIS — E032 Hypothyroidism due to medicaments and other exogenous substances: Secondary | ICD-10-CM | POA: Diagnosis not present

## 2022-11-17 DIAGNOSIS — Z0000 Encounter for general adult medical examination without abnormal findings: Secondary | ICD-10-CM | POA: Diagnosis not present

## 2022-11-17 DIAGNOSIS — E559 Vitamin D deficiency, unspecified: Secondary | ICD-10-CM | POA: Diagnosis not present

## 2022-11-17 DIAGNOSIS — F419 Anxiety disorder, unspecified: Secondary | ICD-10-CM

## 2022-11-17 DIAGNOSIS — Z1329 Encounter for screening for other suspected endocrine disorder: Secondary | ICD-10-CM | POA: Diagnosis not present

## 2022-11-17 DIAGNOSIS — N952 Postmenopausal atrophic vaginitis: Secondary | ICD-10-CM | POA: Insufficient documentation

## 2022-11-17 DIAGNOSIS — Z130 Encounter for screening for diseases of the blood and blood-forming organs and certain disorders involving the immune mechanism: Secondary | ICD-10-CM

## 2022-11-17 DIAGNOSIS — Z13228 Encounter for screening for other metabolic disorders: Secondary | ICD-10-CM | POA: Diagnosis not present

## 2022-11-17 DIAGNOSIS — Z13220 Encounter for screening for lipoid disorders: Secondary | ICD-10-CM

## 2022-11-17 MED ORDER — ALPRAZOLAM 0.5 MG PO TABS: 0.5000 mg | ORAL_TABLET | Freq: Every evening | ORAL | 0 refills | Status: DC | PRN

## 2022-11-18 LAB — LIPID PANEL
Chol/HDL Ratio: 3.9 ratio (ref 0.0–4.4)
Cholesterol, Total: 205 mg/dL — ABNORMAL HIGH (ref 100–199)
HDL: 52 mg/dL (ref >39)
LDL Chol Calc (NIH): 114 mg/dL — ABNORMAL HIGH (ref 0–99)
Triglycerides: 223 mg/dL — ABNORMAL HIGH (ref 0–149)
VLDL Cholesterol Cal: 39 mg/dL (ref 5–40)

## 2022-11-18 LAB — CBC WITH DIFFERENTIAL/PLATELET
Basophils Absolute: 0.1 10*3/uL (ref 0.0–0.2)
Basos: 1 %
EOS (ABSOLUTE): 0.1 10*3/uL (ref 0.0–0.4)
Eos: 1 %
Hematocrit: 41 % (ref 34.0–46.6)
Hemoglobin: 12.8 g/dL (ref 11.1–15.9)
Immature Grans (Abs): 0 10*3/uL (ref 0.0–0.1)
Immature Granulocytes: 0 %
Lymphocytes Absolute: 1.8 10*3/uL (ref 0.7–3.1)
Lymphs: 31 %
MCH: 29.6 pg (ref 26.6–33.0)
MCHC: 31.2 g/dL — ABNORMAL LOW (ref 31.5–35.7)
MCV: 95 fL (ref 79–97)
Monocytes Absolute: 0.4 10*3/uL (ref 0.1–0.9)
Monocytes: 7 %
Neutrophils Absolute: 3.4 10*3/uL (ref 1.4–7.0)
Neutrophils: 60 %
Platelets: 247 10*3/uL (ref 150–450)
RBC: 4.33 x10E6/uL (ref 3.77–5.28)
RDW: 13.4 % (ref 11.7–15.4)
WBC: 5.7 10*3/uL (ref 3.4–10.8)

## 2022-11-18 LAB — COMPREHENSIVE METABOLIC PANEL
ALT: 15 IU/L (ref 0–32)
AST: 28 IU/L (ref 0–40)
Albumin: 4.4 g/dL (ref 3.8–4.8)
Alkaline Phosphatase: 81 IU/L (ref 44–121)
BUN/Creatinine Ratio: 16 (ref 12–28)
BUN: 15 mg/dL (ref 8–27)
Bilirubin Total: 0.2 mg/dL (ref 0.0–1.2)
CO2: 23 mmol/L (ref 20–29)
Calcium: 9.4 mg/dL (ref 8.7–10.3)
Chloride: 102 mmol/L (ref 96–106)
Creatinine, Ser: 0.94 mg/dL (ref 0.57–1.00)
Globulin, Total: 2.3 g/dL (ref 1.5–4.5)
Glucose: 79 mg/dL (ref 70–99)
Potassium: 4.9 mmol/L (ref 3.5–5.2)
Sodium: 139 mmol/L (ref 134–144)
Total Protein: 6.7 g/dL (ref 6.0–8.5)
eGFR: 65 mL/min/{1.73_m2} (ref >59)

## 2022-11-18 LAB — THYROID PANEL WITH TSH
Free Thyroxine Index: 2.7 (ref 1.2–4.9)
T3 Uptake Ratio: 31 % (ref 24–39)
T4, Total: 8.6 ug/dL (ref 4.5–12.0)
TSH: 1.25 u[IU]/mL (ref 0.450–4.500)

## 2022-11-18 LAB — VITAMIN D 25 HYDROXY (VIT D DEFICIENCY, FRACTURES): Vit D, 25-Hydroxy: 42.6 ng/mL (ref 30.0–100.0)

## 2022-11-21 ENCOUNTER — Encounter: Payer: Self-pay | Admitting: Family Medicine

## 2022-11-21 NOTE — Progress Notes (Signed)
Established Patient Office Visit  Subjective    Patient ID: Cassandra Mcfarland, female    DOB: 1950-03-09  Age: 72 y.o. MRN: [id number]  CC:  Chief Complaint  Patient presents with   Annual Exam    HPI Cassandra Mcfarland presents for routine annual exam. Patient denies acute complaints.   Outpatient Encounter Medications as of 11/17/2022  Medication Sig   Cyanocobalamin (VITAMIN B12) 1000 MCG TBCR 1 tablet Orally Once a day for 30 day(s)   ezetimibe (ZETIA) 10 MG tablet TAKE 1 TABLET BY MOUTH EVERY DAY   levothyroxine (SYNTHROID) 88 MCG tablet levothyroxine 88 mcg tablet   lisinopril (ZESTRIL) 10 MG tablet Take 1 tablet (10 mg total) by mouth daily.   Multiple Vitamin (MULTIVITAMIN) capsule Take by mouth.   Multiple Vitamins-Minerals (MULTI FOR HER 50+) TABS as directed Orally   [DISCONTINUED] ALPRAZolam (XANAX) 0.5 MG tablet TAKE 1 TABLET (0.5 MG TOTAL) BY MOUTH AT BEDTIME AS NEEDED.   ALPRAZolam (XANAX) 0.5 MG tablet Take 1 tablet (0.5 mg total) by mouth at bedtime as needed.   Cholecalciferol (VITAMIN D) 50 MCG (2000 UT) tablet Take 2,000 Units by mouth daily. (Patient not taking: Reported on 11/15/2022)   No facility-administered encounter medications on file as of 11/17/2022.    Past Medical History:  Diagnosis Date   Anemia    after knee surgery ( only time)   Anxiety    Complication of anesthesia    Depression    History of urinary tract infection    Hypertension    no longer on medications  since knee surgery and pain resolved   Osteoarthritis    PONV (postoperative nausea and vomiting)    Situational anxiety 09/2003   Situational depression    Thyroid disease    hyper parathyroidism    Past Surgical History:  Procedure Laterality Date   abddominoplasty  1995   APPENDECTOMY  Age 89   BREAST REDUCTION SURGERY  09/2001   & Lift   COLONOSCOPY     EYE SURGERY     cataract bil   ORIF ELBOW FRACTURE Left 03/15/2017   Procedure: OPEN REDUCTION INTERNAL FIXATION (ORIF)  ELBOW/OLECRANON FRACTURE;  Surgeon: Myrene Galas, MD;  Location: [facility] OR;  Service: Orthopedics;  Laterality: Left;   PARATHYROIDECTOMY  5/1/112   due to hypercalcemia   SHOULDER SURGERY     left rotator cuff; bicep tendon repair on left    THUMB FUSION  08/2007   with bone graft- post trauma Dr. Cyndee Brightly   THYROIDECTOMY N/A 10/23/2017   Procedure: TOTAL THYROIDECTOMY;  Surgeon: Darnell Level, MD;  Location: [facility];  Service: General;  Laterality: N/A;   toatal knee replacement Right 2001   TOTAL KNEE ARTHROPLASTY Left 08/02/2015   Procedure: LEFT TOTAL KNEE ARTHROPLASTY;  Surgeon: Ollen Gross, MD;  Location: [facility];  Service: Orthopedics;  Laterality: Left;   TOTAL THYROIDECTOMY     Gerkin   TUBAL LIGATION  1994    Family History  Problem Relation Age of Onset   Coronary artery disease Father    Heart disease Father    Heart attack Father    Coronary artery disease Brother    Hypertension Brother    Cancer Mother    Leukemia Mother 21   Osteoporosis Mother    Hypertension Sister    Osteoporosis Sister    Hypertension Brother    Osteoporosis Maternal Aunt     Social History   Socioeconomic History   Marital status: Married  Spouse name: Not on file   Number of children: Not on file   Years of education: Not on file   Highest education level: Not on file  Occupational History   Not on file  Tobacco Use   Smoking status: Never   Smokeless tobacco: Never  Vaping Use   Vaping status: Never Used  Substance and Sexual Activity   Alcohol use: Yes    Comment: occas glass of wine    Drug use: No   Sexual activity: Yes    Partners: Male    Birth control/protection: Post-menopausal  Other Topics Concern   Not on file  Social History Narrative   Not on file   Social Determinants of Health   Financial Resource Strain: Low Risk  (11/15/2022)   Overall Financial Resource Strain (CARDIA)    Difficulty of Paying Living Expenses: Not hard at all  Food Insecurity: No Food  Insecurity (11/15/2022)   Hunger Vital Sign    Worried About Running Out of Food in the Last Year: Never true    Ran Out of Food in the Last Year: Never true  Transportation Needs: No Transportation Needs (11/15/2022)   PRAPARE - Administrator, Civil Service (Medical): No    Lack of Transportation (Non-Medical): No  Physical Activity: Sufficiently Active (11/15/2022)   Exercise Vital Sign    Days of Exercise per Week: 7 days    Minutes of Exercise per Session: 30 min  Stress: No Stress Concern Present (11/15/2022)   [employer] of Occupational Health - Occupational Stress Questionnaire    Feeling of Stress : Only a little  Social Connections: Socially Integrated (11/15/2022)   Social Connection and Isolation Panel [NHANES]    Frequency of Communication with Friends and Family: More than three times a week    Frequency of Social Gatherings with Friends and Family: Twice a week    Attends Religious Services: More than 4 times per year    Active Member of [employer] or Organizations: Yes    Attends Banker Meetings: More than 4 times per year    Marital Status: Married  Catering manager Violence: Not At Risk (11/15/2022)   Humiliation, Afraid, Rape, and Kick questionnaire    Fear of Current or Ex-Partner: No    Emotionally Abused: No    Physically Abused: No    Sexually Abused: No    Review of Systems  All other systems reviewed and are negative.       Objective    BP 123/87   Pulse 75   Temp 98.1 F (36.7 C) (Oral)   Resp 16   Ht 5\' 4"  (1.626 m)   Wt 135 lb (61.2 kg)   LMP 11/10/2014 (Exact Date)   SpO2 98%   BMI 23.17 kg/m   Physical Exam Vitals and nursing note reviewed.  Constitutional:      General: She is not in acute distress. HENT:     Head: Normocephalic and atraumatic.     Right Ear: Tympanic membrane, ear canal and external ear normal.     Left Ear: Tympanic membrane, ear canal and external ear normal.     Nose: Nose normal.      Mouth/Throat:     Mouth: Mucous membranes are moist.     Pharynx: Oropharynx is clear.  Eyes:     Conjunctiva/sclera: Conjunctivae normal.     Pupils: Pupils are equal, round, and reactive to light.  Neck:     Thyroid: No thyromegaly.  Cardiovascular:     Rate and Rhythm: Normal rate and regular rhythm.     Heart sounds: Normal heart sounds. No murmur heard. Pulmonary:     Effort: Pulmonary effort is normal. No respiratory distress.     Breath sounds: Normal breath sounds.  Abdominal:     General: There is no distension.     Palpations: Abdomen is soft. There is no mass.     Tenderness: There is no abdominal tenderness.  Musculoskeletal:        General: Normal range of motion.     Cervical back: Normal range of motion and neck supple.  Skin:    General: Skin is warm and dry.  Neurological:     General: No focal deficit present.     Mental Status: She is alert and oriented to person, place, and time.  Psychiatric:        Mood and Affect: Mood normal.        Behavior: Behavior normal.         Assessment & Plan:   Annual physical exam -     Comprehensive metabolic panel  Iatrogenic hypothyroidism -     Thyroid Panel With TSH  Screening for deficiency anemia -     CBC with Differential/Platelet  Screening for lipid disorders -     Lipid panel  Screening for endocrine/metabolic/immunity disorders  Vitamin D deficiency -     VITAMIN D 25 Hydroxy (Vit-D Deficiency, Fractures)  Anxiety -     ALPRAZolam; Take 1 tablet (0.5 mg total) by mouth at bedtime as needed.  Dispense: 90 tablet; Refill: 0     No follow-ups on file.   Tommie Raymond, MD

## 2022-12-25 ENCOUNTER — Other Ambulatory Visit: Payer: Self-pay | Admitting: Family Medicine

## 2023-02-14 DIAGNOSIS — M19031 Primary osteoarthritis, right wrist: Secondary | ICD-10-CM | POA: Insufficient documentation

## 2023-02-14 DIAGNOSIS — M79642 Pain in left hand: Secondary | ICD-10-CM | POA: Diagnosis not present

## 2023-02-14 DIAGNOSIS — M19032 Primary osteoarthritis, left wrist: Secondary | ICD-10-CM | POA: Insufficient documentation

## 2023-02-14 DIAGNOSIS — M189 Osteoarthritis of first carpometacarpal joint, unspecified: Secondary | ICD-10-CM | POA: Insufficient documentation

## 2023-02-14 DIAGNOSIS — M1811 Unilateral primary osteoarthritis of first carpometacarpal joint, right hand: Secondary | ICD-10-CM | POA: Diagnosis not present

## 2023-02-14 DIAGNOSIS — G5600 Carpal tunnel syndrome, unspecified upper limb: Secondary | ICD-10-CM | POA: Insufficient documentation

## 2023-02-14 DIAGNOSIS — G5601 Carpal tunnel syndrome, right upper limb: Secondary | ICD-10-CM | POA: Diagnosis not present

## 2023-02-16 ENCOUNTER — Other Ambulatory Visit: Payer: Self-pay | Admitting: Family Medicine

## 2023-02-16 DIAGNOSIS — F419 Anxiety disorder, unspecified: Secondary | ICD-10-CM

## 2023-03-05 ENCOUNTER — Other Ambulatory Visit: Payer: Self-pay | Admitting: Family Medicine

## 2023-03-05 DIAGNOSIS — Z0000 Encounter for general adult medical examination without abnormal findings: Secondary | ICD-10-CM

## 2023-03-23 ENCOUNTER — Other Ambulatory Visit: Payer: Self-pay | Admitting: Family Medicine

## 2023-03-30 ENCOUNTER — Ambulatory Visit
Admission: RE | Admit: 2023-03-30 | Discharge: 2023-03-30 | Disposition: A | Discharge status: Home / Self Care | Payer: PPO | Source: Ambulatory Visit | Attending: Family Medicine | Admitting: Family Medicine

## 2023-03-30 DIAGNOSIS — Z0000 Encounter for general adult medical examination without abnormal findings: Secondary | ICD-10-CM

## 2023-03-30 DIAGNOSIS — Z1231 Encounter for screening mammogram for malignant neoplasm of breast: Secondary | ICD-10-CM | POA: Diagnosis not present

## 2023-04-10 DIAGNOSIS — M19031 Primary osteoarthritis, right wrist: Secondary | ICD-10-CM | POA: Diagnosis not present

## 2023-04-10 DIAGNOSIS — M1811 Unilateral primary osteoarthritis of first carpometacarpal joint, right hand: Secondary | ICD-10-CM | POA: Diagnosis not present

## 2023-04-10 DIAGNOSIS — G5601 Carpal tunnel syndrome, right upper limb: Secondary | ICD-10-CM | POA: Diagnosis not present

## 2023-04-10 DIAGNOSIS — M13831 Other specified arthritis, right wrist: Secondary | ICD-10-CM | POA: Diagnosis not present

## 2023-04-24 DIAGNOSIS — H43812 Vitreous degeneration, left eye: Secondary | ICD-10-CM | POA: Diagnosis not present

## 2023-04-24 DIAGNOSIS — H04123 Dry eye syndrome of bilateral lacrimal glands: Secondary | ICD-10-CM | POA: Diagnosis not present

## 2023-04-24 DIAGNOSIS — H35371 Puckering of macula, right eye: Secondary | ICD-10-CM | POA: Diagnosis not present

## 2023-04-25 DIAGNOSIS — Z4789 Encounter for other orthopedic aftercare: Secondary | ICD-10-CM | POA: Diagnosis not present

## 2023-05-06 ENCOUNTER — Other Ambulatory Visit: Payer: Self-pay | Admitting: Family Medicine

## 2023-05-07 DIAGNOSIS — M25641 Stiffness of right hand, not elsewhere classified: Secondary | ICD-10-CM | POA: Diagnosis not present

## 2023-05-07 DIAGNOSIS — Z4789 Encounter for other orthopedic aftercare: Secondary | ICD-10-CM | POA: Diagnosis not present

## 2023-05-17 ENCOUNTER — Encounter: Payer: Self-pay | Admitting: Family Medicine

## 2023-05-17 ENCOUNTER — Ambulatory Visit (INDEPENDENT_AMBULATORY_CARE_PROVIDER_SITE_OTHER): Payer: PPO | Admitting: Family Medicine

## 2023-05-17 VITALS — BP 126/85 | HR 85 | Temp 98.0°F | Resp 16 | Wt 135.0 lb

## 2023-05-17 DIAGNOSIS — E785 Hyperlipidemia, unspecified: Secondary | ICD-10-CM

## 2023-05-17 DIAGNOSIS — E0590 Thyrotoxicosis, unspecified without thyrotoxic crisis or storm: Secondary | ICD-10-CM | POA: Diagnosis not present

## 2023-05-17 DIAGNOSIS — F419 Anxiety disorder, unspecified: Secondary | ICD-10-CM | POA: Diagnosis not present

## 2023-05-17 DIAGNOSIS — I10 Essential (primary) hypertension: Secondary | ICD-10-CM

## 2023-05-17 MED ORDER — ALPRAZOLAM 0.5 MG PO TABS: 0.5000 mg | ORAL_TABLET | Freq: Every evening | ORAL | 0 refills | Status: DC | PRN

## 2023-05-17 MED ORDER — LISINOPRIL 10 MG PO TABS: 10.0000 mg | ORAL_TABLET | Freq: Every day | ORAL | 1 refills | Status: DC

## 2023-05-17 MED ORDER — EZETIMIBE 10 MG PO TABS: 10.0000 mg | ORAL_TABLET | Freq: Every day | ORAL | 1 refills | Status: DC

## 2023-05-17 NOTE — Progress Notes (Signed)
 Established Patient Office Visit  Subjective    Patient ID: Cassandra Mcfarland, female    DOB: 04-05-1950  Age: 73 y.o. MRN: [id number]  CC:  Chief Complaint  Patient presents with   Medical Management of Chronic Issues    HPI Cassandra Mcfarland presents for routine follow up of chronic med issues including hypertension and anxiety. Patient reports med compliance and denies acute complaints.   Outpatient Encounter Medications as of 05/17/2023  Medication Sig   Cholecalciferol (VITAMIN D) 50 MCG (2000 UT) tablet Take 2,000 Units by mouth daily.   Cyanocobalamin (VITAMIN B12) 1000 MCG TBCR 1 tablet Orally Once a day for 30 day(s)   doxycycline (VIBRAMYCIN) 100 MG capsule Take 100 mg by mouth 2 (two) times daily.   HYDROmorphone (DILAUDID) 2 MG tablet Take 2-4 mg by mouth every 8 (eight) hours as needed.   levothyroxine (SYNTHROID) 88 MCG tablet levothyroxine 88 mcg tablet   methocarbamol (ROBAXIN) 500 MG tablet Take 500 mg by mouth every 6 (six) hours as needed.   Multiple Vitamin (MULTIVITAMIN) capsule Take by mouth.   Multiple Vitamins-Minerals (MULTI FOR HER 50+) TABS as directed Orally   ondansetron (ZOFRAN) 4 MG tablet SMARTSIG:2 Tablet(s) By Mouth Every 6-8 Hours PRN   traMADol (ULTRAM) 50 MG tablet Take 50 mg by mouth.   [DISCONTINUED] ALPRAZolam (XANAX) 0.5 MG tablet TAKE 1 TABLET (0.5 MG TOTAL) BY MOUTH AT BEDTIME AS NEEDED.   [DISCONTINUED] ezetimibe (ZETIA) 10 MG tablet TAKE 1 TABLET BY MOUTH EVERY DAY   [DISCONTINUED] lisinopril (ZESTRIL) 10 MG tablet TAKE 1 TABLET BY MOUTH EVERY DAY   ALPRAZolam (XANAX) 0.5 MG tablet Take 1 tablet (0.5 mg total) by mouth at bedtime as needed.   ezetimibe (ZETIA) 10 MG tablet Take 1 tablet (10 mg total) by mouth daily.   lisinopril (ZESTRIL) 10 MG tablet Take 1 tablet (10 mg total) by mouth daily.   No facility-administered encounter medications on file as of 05/17/2023.    Past Medical History:  Diagnosis Date   Anemia    after knee surgery  ( only time)   Anxiety    Complication of anesthesia    Depression    History of urinary tract infection    Hypertension    no longer on medications  since knee surgery and pain resolved   Osteoarthritis    PONV (postoperative nausea and vomiting)    Situational anxiety 09/2003   Situational depression    Thyroid disease    hyper parathyroidism    Past Surgical History:  Procedure Laterality Date   abddominoplasty  1995   APPENDECTOMY  Age 65   BREAST REDUCTION SURGERY  09/2001   & Lift   COLONOSCOPY     EYE SURGERY     cataract bil   ORIF ELBOW FRACTURE Left 03/15/2017   Procedure: OPEN REDUCTION INTERNAL FIXATION (ORIF) ELBOW/OLECRANON FRACTURE;  Surgeon: Myrene Galas, MD;  Location: [facility];  Service: Orthopedics;  Laterality: Left;   PARATHYROIDECTOMY  5/1/112   due to hypercalcemia   SHOULDER SURGERY     left rotator cuff; bicep tendon repair on left    THUMB FUSION  08/2007   with bone graft- post trauma Dr. Cyndee Brightly   THYROIDECTOMY N/A 10/23/2017   Procedure: TOTAL THYROIDECTOMY;  Surgeon: Darnell Level, MD;  Location: [facility];  Service: General;  Laterality: N/A;   toatal knee replacement Right 2001   TOTAL KNEE ARTHROPLASTY Left 08/02/2015   Procedure: LEFT TOTAL KNEE ARTHROPLASTY;  Surgeon: Homero Fellers  Aluisio, MD;  Location: [facility];  Service: Orthopedics;  Laterality: Left;   TOTAL THYROIDECTOMY     Gerkin   TUBAL LIGATION  1994    Family History  Problem Relation Age of Onset   Coronary artery disease Father    Heart disease Father    Heart attack Father    Coronary artery disease Brother    Hypertension Brother    Cancer Mother    Leukemia Mother 37   Osteoporosis Mother    Hypertension Sister    Osteoporosis Sister    Hypertension Brother    Osteoporosis Maternal Aunt     Social History   Socioeconomic History   Marital status: Married    Spouse name: Not on file   Number of children: Not on file   Years of education: Not on file   Highest education  level: Not on file  Occupational History   Not on file  Tobacco Use   Smoking status: Never   Smokeless tobacco: Never  Vaping Use   Vaping status: Never Used  Substance and Sexual Activity   Alcohol use: Yes    Comment: occas glass of wine    Drug use: No   Sexual activity: Yes    Partners: Male    Birth control/protection: Post-menopausal  Other Topics Concern   Not on file  Social History Narrative   Not on file   Social Drivers of Health   Financial Resource Strain: Low Risk  (11/15/2022)   Overall Financial Resource Strain (CARDIA)    Difficulty of Paying Living Expenses: Not hard at all  Food Insecurity: No Food Insecurity (11/15/2022)   Hunger Vital Sign    Worried About Running Out of Food in the Last Year: Never true    Ran Out of Food in the Last Year: Never true  Transportation Needs: No Transportation Needs (11/15/2022)   PRAPARE - Administrator, Civil Service (Medical): No    Lack of Transportation (Non-Medical): No  Physical Activity: Sufficiently Active (11/15/2022)   Exercise Vital Sign    Days of Exercise per Week: 7 days    Minutes of Exercise per Session: 30 min  Stress: No Stress Concern Present (11/15/2022)   [employer] of Occupational Health - Occupational Stress Questionnaire    Feeling of Stress : Only a little  Social Connections: Socially Integrated (11/15/2022)   Social Connection and Isolation Panel [NHANES]    Frequency of Communication with Friends and Family: More than three times a week    Frequency of Social Gatherings with Friends and Family: Twice a week    Attends Religious Services: More than 4 times per year    Active Member of [employer] or Organizations: Yes    Attends Banker Meetings: More than 4 times per year    Marital Status: Married  Catering manager Violence: Not At Risk (11/15/2022)   Humiliation, Afraid, Rape, and Kick questionnaire    Fear of Current or Ex-Partner: No    Emotionally Abused: No     Physically Abused: No    Sexually Abused: No    Review of Systems  All other systems reviewed and are negative.       Objective    BP 126/85   Pulse 85   Temp 98 F (36.7 C) (Oral)   Resp 16   Wt 135 lb (61.2 kg)   LMP 11/10/2014 (Exact Date)   SpO2 97%   BMI 23.17 kg/m   Physical Exam Vitals  and nursing note reviewed.  Constitutional:      General: She is not in acute distress. Neck:     Thyroid: No thyromegaly.  Cardiovascular:     Rate and Rhythm: Normal rate and regular rhythm.  Pulmonary:     Effort: Pulmonary effort is normal.     Breath sounds: Normal breath sounds.  Abdominal:     Palpations: Abdomen is soft.     Tenderness: There is no abdominal tenderness.  Musculoskeletal:     Cervical back: Normal range of motion and neck supple.  Neurological:     General: No focal deficit present.     Mental Status: She is alert and oriented to person, place, and time.  Psychiatric:        Mood and Affect: Mood normal.        Behavior: Behavior normal.         Assessment & Plan:   1. Essential hypertension (Primary) Appears stable. Continue   2. Hyperlipidemia, unspecified hyperlipidemia type Continue   3. Hyperthyroidism management as per consultant. Appears stable.   4. Anxiety  - ALPRAZolam (XANAX) 0.5 MG tablet; Take 1 tablet (0.5 mg total) by mouth at bedtime as needed.  Dispense: 90 tablet; Refill: 0    Return in about 6 months (around 11/17/2023) for follow up, chronic med issues.   Tommie Raymond, MD

## 2023-05-17 NOTE — Progress Notes (Signed)
 Patient is here for their 6 month follow-up Patient has no concerns today Care gaps have been discussed with patient

## 2023-05-21 ENCOUNTER — Other Ambulatory Visit: Payer: Self-pay | Admitting: Family Medicine

## 2023-05-21 DIAGNOSIS — M25641 Stiffness of right hand, not elsewhere classified: Secondary | ICD-10-CM | POA: Diagnosis not present

## 2023-05-21 DIAGNOSIS — F419 Anxiety disorder, unspecified: Secondary | ICD-10-CM

## 2023-05-22 DIAGNOSIS — E213 Hyperparathyroidism, unspecified: Secondary | ICD-10-CM | POA: Diagnosis not present

## 2023-05-22 DIAGNOSIS — E890 Postprocedural hypothyroidism: Secondary | ICD-10-CM | POA: Diagnosis not present

## 2023-05-22 DIAGNOSIS — C73 Malignant neoplasm of thyroid gland: Secondary | ICD-10-CM | POA: Diagnosis not present

## 2023-05-29 DIAGNOSIS — E213 Hyperparathyroidism, unspecified: Secondary | ICD-10-CM | POA: Diagnosis not present

## 2023-05-29 DIAGNOSIS — I10 Essential (primary) hypertension: Secondary | ICD-10-CM | POA: Diagnosis not present

## 2023-05-29 DIAGNOSIS — E890 Postprocedural hypothyroidism: Secondary | ICD-10-CM | POA: Diagnosis not present

## 2023-05-29 DIAGNOSIS — C73 Malignant neoplasm of thyroid gland: Secondary | ICD-10-CM | POA: Diagnosis not present

## 2023-05-29 DIAGNOSIS — M810 Age-related osteoporosis without current pathological fracture: Secondary | ICD-10-CM | POA: Diagnosis not present

## 2023-05-30 ENCOUNTER — Other Ambulatory Visit: Payer: Self-pay | Admitting: Endocrinology

## 2023-05-30 DIAGNOSIS — M810 Age-related osteoporosis without current pathological fracture: Secondary | ICD-10-CM

## 2023-05-31 DIAGNOSIS — M25641 Stiffness of right hand, not elsewhere classified: Secondary | ICD-10-CM | POA: Diagnosis not present

## 2023-06-02 ENCOUNTER — Ambulatory Visit
Admission: RE | Admit: 2023-06-02 | Discharge: 2023-06-02 | Disposition: A | Discharge status: Home / Self Care | Source: Ambulatory Visit | Attending: Endocrinology | Admitting: Endocrinology

## 2023-06-02 DIAGNOSIS — M810 Age-related osteoporosis without current pathological fracture: Secondary | ICD-10-CM | POA: Diagnosis not present

## 2023-06-06 DIAGNOSIS — Z4789 Encounter for other orthopedic aftercare: Secondary | ICD-10-CM | POA: Diagnosis not present

## 2023-06-06 DIAGNOSIS — G5622 Lesion of ulnar nerve, left upper limb: Secondary | ICD-10-CM | POA: Diagnosis not present

## 2023-06-06 DIAGNOSIS — S63592A Other specified sprain of left wrist, initial encounter: Secondary | ICD-10-CM | POA: Diagnosis not present

## 2023-06-20 ENCOUNTER — Encounter: Payer: Self-pay | Admitting: Pharmacist

## 2023-06-20 ENCOUNTER — Other Ambulatory Visit (HOSPITAL_BASED_OUTPATIENT_CLINIC_OR_DEPARTMENT_OTHER): Payer: Self-pay | Admitting: Pharmacist

## 2023-06-20 DIAGNOSIS — I10 Essential (primary) hypertension: Secondary | ICD-10-CM

## 2023-06-20 DIAGNOSIS — G5622 Lesion of ulnar nerve, left upper limb: Secondary | ICD-10-CM | POA: Diagnosis not present

## 2023-06-20 DIAGNOSIS — Z4789 Encounter for other orthopedic aftercare: Secondary | ICD-10-CM | POA: Diagnosis not present

## 2023-06-20 DIAGNOSIS — S63592A Other specified sprain of left wrist, initial encounter: Secondary | ICD-10-CM | POA: Diagnosis not present

## 2023-06-20 NOTE — Progress Notes (Signed)
 Pharmacy Quality Measure Review  This patient is appearing on an adherence report for MAH medications this calendar year.   Medication: lisinopril   Last fill date: 06/17/2023 for 90 day supply  Fills are up to date.   Marene Shape, PharmD, Becky Bowels, CPP Clinical Pharmacist [facility] & [facility] [phone]

## 2023-07-13 DIAGNOSIS — M25532 Pain in left wrist: Secondary | ICD-10-CM | POA: Diagnosis not present

## 2023-07-19 DIAGNOSIS — M13832 Other specified arthritis, left wrist: Secondary | ICD-10-CM | POA: Diagnosis not present

## 2023-07-19 DIAGNOSIS — S63592D Other specified sprain of left wrist, subsequent encounter: Secondary | ICD-10-CM | POA: Diagnosis not present

## 2023-07-19 DIAGNOSIS — G5622 Lesion of ulnar nerve, left upper limb: Secondary | ICD-10-CM | POA: Diagnosis not present

## 2023-07-19 DIAGNOSIS — G5602 Carpal tunnel syndrome, left upper limb: Secondary | ICD-10-CM | POA: Diagnosis not present

## 2023-07-19 DIAGNOSIS — M13831 Other specified arthritis, right wrist: Secondary | ICD-10-CM | POA: Diagnosis not present

## 2023-07-19 DIAGNOSIS — Z4789 Encounter for other orthopedic aftercare: Secondary | ICD-10-CM | POA: Diagnosis not present

## 2023-08-14 DIAGNOSIS — M6598 Unspecified synovitis and tenosynovitis, other site: Secondary | ICD-10-CM | POA: Diagnosis not present

## 2023-08-14 DIAGNOSIS — G5602 Carpal tunnel syndrome, left upper limb: Secondary | ICD-10-CM | POA: Diagnosis not present

## 2023-08-14 DIAGNOSIS — G5622 Lesion of ulnar nerve, left upper limb: Secondary | ICD-10-CM | POA: Diagnosis not present

## 2023-08-14 DIAGNOSIS — M65832 Other synovitis and tenosynovitis, left forearm: Secondary | ICD-10-CM | POA: Diagnosis not present

## 2023-08-14 DIAGNOSIS — M24132 Other articular cartilage disorders, left wrist: Secondary | ICD-10-CM | POA: Diagnosis not present

## 2023-08-14 DIAGNOSIS — M19032 Primary osteoarthritis, left wrist: Secondary | ICD-10-CM | POA: Diagnosis not present

## 2023-08-14 DIAGNOSIS — S66195A Other injury of flexor muscle, fascia and tendon of left ring finger at wrist and hand level, initial encounter: Secondary | ICD-10-CM | POA: Diagnosis not present

## 2023-08-14 DIAGNOSIS — S63592A Other specified sprain of left wrist, initial encounter: Secondary | ICD-10-CM | POA: Diagnosis not present

## 2023-08-14 DIAGNOSIS — X58XXXA Exposure to other specified factors, initial encounter: Secondary | ICD-10-CM | POA: Diagnosis not present

## 2023-08-14 DIAGNOSIS — S66117A Strain of flexor muscle, fascia and tendon of left little finger at wrist and hand level, initial encounter: Secondary | ICD-10-CM | POA: Diagnosis not present

## 2023-08-14 DIAGNOSIS — M66342 Spontaneous rupture of flexor tendons, left hand: Secondary | ICD-10-CM | POA: Diagnosis not present

## 2023-08-14 DIAGNOSIS — M25332 Other instability, left wrist: Secondary | ICD-10-CM | POA: Diagnosis not present

## 2023-08-14 DIAGNOSIS — Y999 Unspecified external cause status: Secondary | ICD-10-CM | POA: Diagnosis not present

## 2023-08-21 ENCOUNTER — Other Ambulatory Visit: Payer: Self-pay | Admitting: Family Medicine

## 2023-08-21 DIAGNOSIS — F419 Anxiety disorder, unspecified: Secondary | ICD-10-CM

## 2023-08-22 DIAGNOSIS — M25642 Stiffness of left hand, not elsewhere classified: Secondary | ICD-10-CM | POA: Diagnosis not present

## 2023-08-28 DIAGNOSIS — M25642 Stiffness of left hand, not elsewhere classified: Secondary | ICD-10-CM | POA: Diagnosis not present

## 2023-09-03 DIAGNOSIS — M25642 Stiffness of left hand, not elsewhere classified: Secondary | ICD-10-CM | POA: Diagnosis not present

## 2023-09-11 DIAGNOSIS — M25632 Stiffness of left wrist, not elsewhere classified: Secondary | ICD-10-CM | POA: Diagnosis not present

## 2023-09-11 DIAGNOSIS — M25642 Stiffness of left hand, not elsewhere classified: Secondary | ICD-10-CM | POA: Diagnosis not present

## 2023-09-17 DIAGNOSIS — Z85828 Personal history of other malignant neoplasm of skin: Secondary | ICD-10-CM | POA: Diagnosis not present

## 2023-09-17 DIAGNOSIS — C44722 Squamous cell carcinoma of skin of right lower limb, including hip: Secondary | ICD-10-CM | POA: Diagnosis not present

## 2023-09-26 ENCOUNTER — Telehealth: Payer: Self-pay | Admitting: Pharmacist

## 2023-09-26 NOTE — Telephone Encounter (Signed)
 Pharmacy Quality Measure Review  Medication: lisinopril   Last fill date: 09/26/2023 for 90 day supply  Fills are up to date. Called pharmacy and confirmed lisinopril  is ready for pick-up.  Herlene Fleeta Morris, PharmD, JAQUELINE, CPP Clinical Pharmacist [facility] & [facility] [phone]

## 2023-09-27 DIAGNOSIS — M25642 Stiffness of left hand, not elsewhere classified: Secondary | ICD-10-CM | POA: Diagnosis not present

## 2023-10-02 DIAGNOSIS — M25642 Stiffness of left hand, not elsewhere classified: Secondary | ICD-10-CM | POA: Diagnosis not present

## 2023-10-03 ENCOUNTER — Other Ambulatory Visit: Payer: Self-pay | Admitting: Pharmacist

## 2023-10-03 NOTE — Progress Notes (Signed)
 Pharmacy Quality Measure Review  This patient is appearing on an adherence report for MAH medications this calendar year. She is currently passing the measure, and fill histories look excellent.   Medication: lisinopril   Last fill date: 09/27/2023 for 90 day supply  Fills are up to date. Reminder set for next fill.  Herlene Fleeta Morris, PharmD, JAQUELINE, CPP Clinical Pharmacist [facility] & [facility] [phone]

## 2023-10-09 DIAGNOSIS — M25642 Stiffness of left hand, not elsewhere classified: Secondary | ICD-10-CM | POA: Diagnosis not present

## 2023-10-16 DIAGNOSIS — M25642 Stiffness of left hand, not elsewhere classified: Secondary | ICD-10-CM | POA: Diagnosis not present

## 2023-10-22 DIAGNOSIS — M25642 Stiffness of left hand, not elsewhere classified: Secondary | ICD-10-CM | POA: Diagnosis not present

## 2023-10-22 DIAGNOSIS — M79642 Pain in left hand: Secondary | ICD-10-CM | POA: Diagnosis not present

## 2023-11-19 ENCOUNTER — Encounter: Payer: Self-pay | Admitting: Family Medicine

## 2023-11-19 ENCOUNTER — Ambulatory Visit (INDEPENDENT_AMBULATORY_CARE_PROVIDER_SITE_OTHER): Admitting: Family Medicine

## 2023-11-19 VITALS — BP 139/86 | HR 66 | Ht 64.0 in | Wt 135.0 lb

## 2023-11-19 DIAGNOSIS — Z130 Encounter for screening for diseases of the blood and blood-forming organs and certain disorders involving the immune mechanism: Secondary | ICD-10-CM

## 2023-11-19 DIAGNOSIS — S63592D Other specified sprain of left wrist, subsequent encounter: Secondary | ICD-10-CM | POA: Diagnosis not present

## 2023-11-19 DIAGNOSIS — Z13228 Encounter for screening for other metabolic disorders: Secondary | ICD-10-CM

## 2023-11-19 DIAGNOSIS — Z136 Encounter for screening for cardiovascular disorders: Secondary | ICD-10-CM | POA: Diagnosis not present

## 2023-11-19 DIAGNOSIS — Z1329 Encounter for screening for other suspected endocrine disorder: Secondary | ICD-10-CM

## 2023-11-19 DIAGNOSIS — Z4789 Encounter for other orthopedic aftercare: Secondary | ICD-10-CM | POA: Diagnosis not present

## 2023-11-19 DIAGNOSIS — Z0000 Encounter for general adult medical examination without abnormal findings: Secondary | ICD-10-CM | POA: Diagnosis not present

## 2023-11-19 NOTE — Progress Notes (Unsigned)
 Established Patient Office Visit  Subjective    Patient ID: Cassandra Mcfarland, female    DOB: 1950-08-19  Age: 73 y.o. MRN: [id number]  CC:  Chief Complaint  Patient presents with   Annual Exam    HPI Cassandra Mcfarland presents for routine annual exam. Patient denies acute complaints.   Outpatient Encounter Medications as of 11/19/2023  Medication Sig   ALPRAZolam  (XANAX ) 0.5 MG tablet TAKE 1 TABLET (0.5 MG TOTAL) BY MOUTH AT BEDTIME AS NEEDED.   Cholecalciferol (VITAMIN D ) 50 MCG (2000 UT) tablet Take 2,000 Units by mouth daily.   Cyanocobalamin  (VITAMIN B12) 1000 MCG TBCR 1 tablet Orally Once a day for 30 day(s)   ezetimibe  (ZETIA ) 10 MG tablet Take 1 tablet (10 mg total) by mouth daily.   levothyroxine (SYNTHROID) 88 MCG tablet levothyroxine 88 mcg tablet   lisinopril  (ZESTRIL ) 10 MG tablet Take 1 tablet (10 mg total) by mouth daily.   Multiple Vitamin (MULTIVITAMIN) capsule Take by mouth.   doxycycline  (VIBRAMYCIN ) 100 MG capsule Take 100 mg by mouth 2 (two) times daily.   HYDROmorphone  (DILAUDID ) 2 MG tablet Take 2-4 mg by mouth every 8 (eight) hours as needed.   methocarbamol  (ROBAXIN ) 500 MG tablet Take 500 mg by mouth every 6 (six) hours as needed.   Multiple Vitamins-Minerals (MULTI FOR HER 50+) TABS as directed Orally   ondansetron  (ZOFRAN ) 4 MG tablet SMARTSIG:2 Tablet(s) By Mouth Every 6-8 Hours PRN   traMADol  (ULTRAM ) 50 MG tablet Take 50 mg by mouth.   No facility-administered encounter medications on file as of 11/19/2023.    Past Medical History:  Diagnosis Date   Anemia    after knee surgery ( only time)   Anxiety    Complication of anesthesia    Depression    History of urinary tract infection    Hypertension    no longer on medications  since knee surgery and pain resolved   Osteoarthritis    PONV (postoperative nausea and vomiting)    Situational anxiety 09/2003   Situational depression    Thyroid  disease    hyper parathyroidism    Past Surgical  History:  Procedure Laterality Date   abddominoplasty  1995   APPENDECTOMY  Age 51   BREAST REDUCTION SURGERY  09/2001   & Lift   COLONOSCOPY     EYE SURGERY     cataract bil   ORIF ELBOW FRACTURE Left 03/15/2017   Procedure: OPEN REDUCTION INTERNAL FIXATION (ORIF) ELBOW/OLECRANON FRACTURE;  Surgeon: Celena Sharper, MD;  Location: [facility];  Service: Orthopedics;  Laterality: Left;   PARATHYROIDECTOMY  5/1/112   due to hypercalcemia   SHOULDER SURGERY     left rotator cuff; bicep tendon repair on left    THUMB FUSION  08/2007   with bone graft- post trauma Dr. darus   THYROIDECTOMY N/A 10/23/2017   Procedure: TOTAL THYROIDECTOMY;  Surgeon: Eletha Boas, MD;  Location: [facility];  Service: General;  Laterality: N/A;   toatal knee replacement Right 2001   TOTAL KNEE ARTHROPLASTY Left 08/02/2015   Procedure: LEFT TOTAL KNEE ARTHROPLASTY;  Surgeon: Dempsey Moan, MD;  Location: [facility];  Service: Orthopedics;  Laterality: Left;   TOTAL THYROIDECTOMY     Gerkin   TUBAL LIGATION  1994    Family History  Problem Relation Age of Onset   Coronary artery disease Father    Heart disease Father    Heart attack Father    Coronary artery disease Brother  Hypertension Brother    Cancer Mother    Leukemia Mother 41   Osteoporosis Mother    Hypertension Sister    Osteoporosis Sister    Hypertension Brother    Osteoporosis Maternal Aunt     Social History   Socioeconomic History   Marital status: Married    Spouse name: Not on file   Number of children: Not on file   Years of education: Not on file   Highest education level: Not on file  Occupational History   Not on file  Tobacco Use   Smoking status: Never   Smokeless tobacco: Never  Vaping Use   Vaping status: Never Used  Substance and Sexual Activity   Alcohol use: Yes    Comment: occas glass of wine    Drug use: No   Sexual activity: Yes    Partners: Male    Birth control/protection: Post-menopausal  Other Topics Concern    Not on file  Social History Narrative   Not on file   Social Drivers of Health   Financial Resource Strain: Low Risk  (11/15/2022)   Overall Financial Resource Strain (CARDIA)    Difficulty of Paying Living Expenses: Not hard at all  Food Insecurity: No Food Insecurity (11/15/2022)   Hunger Vital Sign    Worried About Running Out of Food in the Last Year: Never true    Ran Out of Food in the Last Year: Never true  Transportation Needs: No Transportation Needs (11/15/2022)   PRAPARE - Administrator, Civil Service (Medical): No    Lack of Transportation (Non-Medical): No  Physical Activity: Sufficiently Active (11/15/2022)   Exercise Vital Sign    Days of Exercise per Week: 7 days    Minutes of Exercise per Session: 30 min  Stress: No Stress Concern Present (11/15/2022)   [employer] of Occupational Health - Occupational Stress Questionnaire    Feeling of Stress : Only a little  Social Connections: Socially Integrated (11/15/2022)   Social Connection and Isolation Panel    Frequency of Communication with Friends and Family: More than three times a week    Frequency of Social Gatherings with Friends and Family: Twice a week    Attends Religious Services: More than 4 times per year    Active Member of [employer] or Organizations: Yes    Attends Banker Meetings: More than 4 times per year    Marital Status: Married  Catering manager Violence: Not At Risk (11/15/2022)   Humiliation, Afraid, Rape, and Kick questionnaire    Fear of Current or Ex-Partner: No    Emotionally Abused: No    Physically Abused: No    Sexually Abused: No    Review of Systems  All other systems reviewed and are negative.       Objective    BP 139/86   Pulse 66   Ht 5' 4 (1.626 m)   Wt 135 lb (61.2 kg)   LMP 11/10/2014 (Exact Date)   SpO2 96%   BMI 23.17 kg/m   Physical Exam Vitals and nursing note reviewed.  Constitutional:      General: She is not in acute  distress. HENT:     Head: Normocephalic and atraumatic.     Right Ear: Tympanic membrane, ear canal and external ear normal.     Left Ear: Tympanic membrane, ear canal and external ear normal.     Nose: Nose normal.     Mouth/Throat:     Mouth:  Mucous membranes are moist.     Pharynx: Oropharynx is clear.  Eyes:     Conjunctiva/sclera: Conjunctivae normal.     Pupils: Pupils are equal, round, and reactive to light.  Neck:     Thyroid : No thyromegaly.  Cardiovascular:     Rate and Rhythm: Normal rate and regular rhythm.     Heart sounds: Normal heart sounds. No murmur heard. Pulmonary:     Effort: Pulmonary effort is normal. No respiratory distress.     Breath sounds: Normal breath sounds.  Abdominal:     General: There is no distension.     Palpations: Abdomen is soft. There is no mass.     Tenderness: There is no abdominal tenderness.  Musculoskeletal:        General: Normal range of motion.     Cervical back: Normal range of motion and neck supple.  Skin:    General: Skin is warm and dry.  Neurological:     General: No focal deficit present.     Mental Status: She is alert and oriented to person, place, and time.  Psychiatric:        Mood and Affect: Mood normal.        Behavior: Behavior normal.         Assessment & Plan:   Annual physical exam -     CMP14+EGFR  Screening for deficiency anemia -     CBC with Differential/Platelet  Encounter for screening for cardiovascular disorders -     Lipid panel  Screening for endocrine/metabolic/immunity disorders -     VITAMIN D  25 Hydroxy (Vit-D Deficiency, Fractures) -     Hemoglobin A1c     No follow-ups on file.   Tanda Raguel SQUIBB, MD

## 2023-11-20 ENCOUNTER — Ambulatory Visit: Payer: Self-pay | Admitting: Family Medicine

## 2023-11-20 LAB — CBC WITH DIFFERENTIAL/PLATELET
Basophils Absolute: 0.1 x10E3/uL (ref 0.0–0.2)
Basos: 1 %
EOS (ABSOLUTE): 0.1 x10E3/uL (ref 0.0–0.4)
Eos: 2 %
Hematocrit: 38 % (ref 34.0–46.6)
Hemoglobin: 12.4 g/dL (ref 11.1–15.9)
Immature Grans (Abs): 0 x10E3/uL (ref 0.0–0.1)
Immature Granulocytes: 0 %
Lymphocytes Absolute: 1.7 x10E3/uL (ref 0.7–3.1)
Lymphs: 28 %
MCH: 31.6 pg (ref 26.6–33.0)
MCHC: 32.6 g/dL (ref 31.5–35.7)
MCV: 97 fL (ref 79–97)
Monocytes Absolute: 0.5 x10E3/uL (ref 0.1–0.9)
Monocytes: 8 %
Neutrophils Absolute: 3.9 x10E3/uL (ref 1.4–7.0)
Neutrophils: 61 %
Platelets: 240 x10E3/uL (ref 150–450)
RBC: 3.93 x10E6/uL (ref 3.77–5.28)
RDW: 13.8 % (ref 11.7–15.4)
WBC: 6.3 x10E3/uL (ref 3.4–10.8)

## 2023-11-20 LAB — LIPID PANEL
Chol/HDL Ratio: 3.9 ratio (ref 0.0–4.4)
Cholesterol, Total: 236 mg/dL — ABNORMAL HIGH (ref 100–199)
HDL: 60 mg/dL (ref >39)
LDL Chol Calc (NIH): 127 mg/dL — ABNORMAL HIGH (ref 0–99)
Triglycerides: 281 mg/dL — ABNORMAL HIGH (ref 0–149)
VLDL Cholesterol Cal: 49 mg/dL — ABNORMAL HIGH (ref 5–40)

## 2023-11-20 LAB — HEMOGLOBIN A1C
Est. average glucose Bld gHb Est-mCnc: 108 mg/dL
Hgb A1c MFr Bld: 5.4 % (ref 4.8–5.6)

## 2023-11-20 LAB — CMP14+EGFR
ALT: 19 IU/L (ref 0–32)
AST: 25 IU/L (ref 0–40)
Albumin: 4.4 g/dL (ref 3.8–4.8)
Alkaline Phosphatase: 64 IU/L (ref 49–135)
BUN/Creatinine Ratio: 28 (ref 12–28)
BUN: 22 mg/dL (ref 8–27)
Bilirubin Total: 0.3 mg/dL (ref 0.0–1.2)
CO2: 22 mmol/L (ref 20–29)
Calcium: 9.4 mg/dL (ref 8.7–10.3)
Chloride: 104 mmol/L (ref 96–106)
Creatinine, Ser: 0.78 mg/dL (ref 0.57–1.00)
Globulin, Total: 2.1 g/dL (ref 1.5–4.5)
Glucose: 78 mg/dL (ref 70–99)
Potassium: 4.7 mmol/L (ref 3.5–5.2)
Sodium: 140 mmol/L (ref 134–144)
Total Protein: 6.5 g/dL (ref 6.0–8.5)
eGFR: 81 mL/min/1.73 (ref >59)

## 2023-11-20 LAB — VITAMIN D 25 HYDROXY (VIT D DEFICIENCY, FRACTURES): Vit D, 25-Hydroxy: 31 ng/mL (ref 30.0–100.0)

## 2023-11-21 ENCOUNTER — Other Ambulatory Visit: Payer: Self-pay | Admitting: Family Medicine

## 2023-11-21 DIAGNOSIS — F419 Anxiety disorder, unspecified: Secondary | ICD-10-CM

## 2023-11-22 NOTE — Telephone Encounter (Signed)
 Copied from CRM [id number]. Topic: Clinical - Medication Question >> Nov 22, 2023 10:16 AM Dedra B wrote: Reason for CRM: Pt is out of alprazolam  and called  to follow up on refill request.

## 2023-11-26 ENCOUNTER — Encounter: Payer: Self-pay | Admitting: Family Medicine

## 2023-12-16 ENCOUNTER — Encounter: Payer: Self-pay | Admitting: Family Medicine

## 2023-12-17 ENCOUNTER — Other Ambulatory Visit: Payer: Self-pay | Admitting: Family Medicine

## 2023-12-17 MED ORDER — ALENDRONATE SODIUM 70 MG PO TABS: 70.0000 mg | ORAL_TABLET | ORAL | 11 refills | Status: AC

## 2023-12-19 DIAGNOSIS — D225 Melanocytic nevi of trunk: Secondary | ICD-10-CM | POA: Diagnosis not present

## 2023-12-19 DIAGNOSIS — L821 Other seborrheic keratosis: Secondary | ICD-10-CM | POA: Diagnosis not present

## 2023-12-19 DIAGNOSIS — D1801 Hemangioma of skin and subcutaneous tissue: Secondary | ICD-10-CM | POA: Diagnosis not present

## 2023-12-19 DIAGNOSIS — Z85828 Personal history of other malignant neoplasm of skin: Secondary | ICD-10-CM | POA: Diagnosis not present

## 2023-12-19 DIAGNOSIS — L814 Other melanin hyperpigmentation: Secondary | ICD-10-CM | POA: Diagnosis not present

## 2023-12-22 ENCOUNTER — Other Ambulatory Visit: Payer: Self-pay | Admitting: Family Medicine

## 2023-12-24 NOTE — Telephone Encounter (Signed)
 Requested Prescriptions  Pending Prescriptions Disp Refills   lisinopril  (ZESTRIL ) 10 MG tablet [Pharmacy Med Name: LISINOPRIL  10 MG TABLET] 90 tablet 1    Sig: TAKE 1 TABLET BY MOUTH EVERY DAY     Cardiovascular:  ACE Inhibitors Passed - 12/24/2023  4:42 PM      Passed - Cr in normal range and within 180 days    Creat  Date Value Ref Range Status  10/03/2019 0.86 0.50 - 0.99 mg/dL Final    Comment:    For patients >72 years of age, the reference limit for Creatinine is approximately 13% higher for people identified as African-American. .    Creatinine, Ser  Date Value Ref Range Status  11/19/2023 0.78 0.57 - 1.00 mg/dL Final         Passed - K in normal range and within 180 days    Potassium  Date Value Ref Range Status  11/19/2023 4.7 3.5 - 5.2 mmol/L Final         Passed - Patient is not pregnant      Passed - Last BP in normal range    BP Readings from Last 1 Encounters:  11/19/23 139/86         Passed - Valid encounter within last 6 months    Recent Outpatient Visits           1 month ago Annual physical exam   [employer] [facility], MD   7 months ago Essential hypertension   [place] [facility], MD   1 year ago Annual physical exam   [place] [facility], MD   1 year ago Essential hypertension    [facility] at [facility], MD

## 2023-12-25 ENCOUNTER — Ambulatory Visit

## 2024-01-02 ENCOUNTER — Telehealth: Payer: Self-pay | Admitting: Pharmacist

## 2024-01-02 NOTE — Telephone Encounter (Signed)
 Pharmacy Quality Measure Review  This patient is appearing on a report the adherence measure for hypertension (ACEi/ARB) medications this calendar year.   Medication: lisinopril  Last fill date: 12/24/2023 for 90 day supply  Insurance report was not up to date. No action needed at this time.   Herlene Fleeta Morris, PharmD, JAQUELINE, CPP Clinical Pharmacist [facility] & [facility] [phone]

## 2024-01-14 DIAGNOSIS — M19032 Primary osteoarthritis, left wrist: Secondary | ICD-10-CM | POA: Diagnosis not present

## 2024-01-14 DIAGNOSIS — M19031 Primary osteoarthritis, right wrist: Secondary | ICD-10-CM | POA: Diagnosis not present

## 2024-01-14 DIAGNOSIS — Z4789 Encounter for other orthopedic aftercare: Secondary | ICD-10-CM | POA: Diagnosis not present

## 2024-02-22 ENCOUNTER — Other Ambulatory Visit: Payer: Self-pay | Admitting: Family Medicine

## 2024-02-22 DIAGNOSIS — F419 Anxiety disorder, unspecified: Secondary | ICD-10-CM

## 2024-03-10 ENCOUNTER — Other Ambulatory Visit: Payer: Self-pay | Admitting: Family Medicine

## 2024-03-10 DIAGNOSIS — Z1231 Encounter for screening mammogram for malignant neoplasm of breast: Secondary | ICD-10-CM

## 2024-03-25 ENCOUNTER — Ambulatory Visit: Admitting: Family Medicine

## 2024-03-25 ENCOUNTER — Encounter: Payer: Self-pay | Admitting: Family Medicine

## 2024-03-25 VITALS — BP 143/87 | HR 81 | Ht 64.0 in | Wt 129.0 lb

## 2024-03-25 DIAGNOSIS — E0590 Thyrotoxicosis, unspecified without thyrotoxic crisis or storm: Secondary | ICD-10-CM

## 2024-03-25 DIAGNOSIS — M150 Primary generalized (osteo)arthritis: Secondary | ICD-10-CM | POA: Diagnosis not present

## 2024-03-25 DIAGNOSIS — E785 Hyperlipidemia, unspecified: Secondary | ICD-10-CM

## 2024-03-25 DIAGNOSIS — M810 Age-related osteoporosis without current pathological fracture: Secondary | ICD-10-CM

## 2024-03-25 DIAGNOSIS — F5105 Insomnia due to other mental disorder: Secondary | ICD-10-CM | POA: Diagnosis not present

## 2024-03-25 DIAGNOSIS — F99 Mental disorder, not otherwise specified: Secondary | ICD-10-CM

## 2024-03-25 DIAGNOSIS — F419 Anxiety disorder, unspecified: Secondary | ICD-10-CM | POA: Diagnosis not present

## 2024-03-25 DIAGNOSIS — Z85850 Personal history of malignant neoplasm of thyroid: Secondary | ICD-10-CM

## 2024-03-25 MED ORDER — TRAZODONE HCL 50 MG PO TABS: 25.0000 mg | ORAL_TABLET | Freq: Every evening | ORAL | 3 refills | Status: AC | PRN

## 2024-03-25 NOTE — Progress Notes (Signed)
 " Assessment & Plan   Assessment/Plan:     Assessment & Plan Insomnia and anxiety Chronic insomnia and anxiety managed with alprazolam  0.5 mg at night. Concerns about long-term benzodiazepine use, including potential for dependence and dementia. Open to trying alternative medications for sleep. - Prescribed trazodone  25 mg as needed for sleep, with option to increase to 50 mg if needed. - Advised to take trazodone  30 minutes before bedtime. - Discussed potential side effects and advised to monitor for grogginess.  Hypertension Managed with lisinopril  10 mg daily. Blood pressure today was 143/80 mmHg, slightly elevated. Home blood pressure readings previously around 127-128/80 mmHg. - Advised to monitor blood pressure at home. - Will schedule follow-up in one month to reassess blood pressure.  Dyslipidemia Long-standing hyperlipidemia with elevated triglycerides. Currently on Zetia  10 mg daily. Previous intolerance to statins due to muscle pain. Discussed potential for newer statins with lower risk of muscle pain, but she prefers to avoid statins due to past experiences. - Continue Zetia  10 mg daily. - Will reassess cholesterol management at next visit.  Osteoporosis Managed with calcium  and vitamin D  supplementation. Intolerance to Fosamax  due to severe gastrointestinal side effects. Declined Prolia injections due to concerns about long-term effects and preference to avoid injections. - Continue calcium  and vitamin D  supplementation.  Osteoarthritis, multiple joints Osteoarthritis affecting multiple joints, including hands and back. Managed with celecoxib 200 mg as needed for pain and inflammation. - Continue celecoxib 200 mg as needed for pain and inflammation.  Hypothyroidism post-thyroidectomy Hypothyroidism secondary to thyroidectomy for thyroid  cancer. Managed with levothyroxine 88 mcg daily. - Continue levothyroxine 88 mcg daily.  Vitamin B12 deficiency anemia Managed with  high-dose vitamin B12 supplementation. Uncertain if deficiency persists. - Consider future check vitamin B12 levels to assess current status.  History of thyroid  cancer Thyroid  cancer status post-thyroidectomy. Regular monitoring with special blood tests to ensure no recurrence. - Continue annual follow-up with endocrinologist for thyroid  cancer monitoring.  General health maintenance Routine health maintenance discussed, including mammogram and colonoscopy screenings. Mammogram scheduled for next Monday. Last colonoscopy was 8-9 years ago. - Continue with scheduled mammogram. - Will discuss colonoscopy scheduling at next visit.  Recording duration: 28 minutes      There are no discontinued medications.           Subjective:   Encounter date: 03/25/2024  Cassandra Mcfarland is a 74 y.o. female who has HYPERCALCEMIA; MENOPAUSAL SYNDROME; Osteoarthritis; Dyslipidemia; Hypertension; OA (osteoarthritis) of knee; Iron  deficiency anemia; Depression; Low serum thyroid  stimulating hormone (TSH); Hyperthyroidism; Multiple thyroid  nodules; Iatrogenic hypothyroidism; History of total knee replacement, left; Acute pain of right wrist; Atrophic vaginitis; History of thyroid  cancer; Closed fracture of distal end of right radius; Herniation of rectum into vagina; Laceration of left middle finger with foreign body without damage to nail; Open wound of left hand due to dog bite; Osteopenia; Pain in left arm; Pain in left knee; Nuclear cataract of left eye, nonsenile; Pain of left shoulder joint on movement; Anemia; Sprain of MCL (medial collateral ligament) of knee; Osteoarthritis of carpometacarpal (CMC) joint of thumb; Carpal tunnel syndrome; Arthritis of right wrist; Arthritis of left wrist; Stiffness of left shoulder joint; and Pain of left hand on their problem list..   She  has a past medical history of Anemia, Anxiety, Complication of anesthesia, Depression, History of urinary tract infection,  Hypertension, Osteoarthritis, PONV (postoperative nausea and vomiting), Situational anxiety (09/2003), Situational depression, and Thyroid  disease..   She presents with chief complaint of Transitions Of  Care (Pt presents today for TOC. Establish care with provider, states she has osteoporasis and HLD ) .   Discussed the use of AI scribe software for clinical note transcription with the patient, who gave verbal consent to proceed.  History of Present Illness Cassandra Mcfarland is a 74 year old female who presents for establishment of care.  Osteoporosis - Previously managed with Fosamax , discontinued due to severe indigestion - Currently taking calcium  and vitamin D  supplementation - Hesitant to initiate Prolia injections  Vitamin b12 deficiency - Managed with cyanocobalamin  1000 mcg daily - Initially treated with high doses of B12 and vitamin C post-surgery for nerve regeneration and healing  Hypothyroidism post-thyroidectomy - Thyroidectomy performed due to incidentally discovered thyroid  cancer - Managed with levothyroxine 88 mcg daily - Annual monitoring by endocrinologist  Hypertension - Managed with lisinopril  10 mg daily - Home blood pressure readings typically 127-128/80 mmHg  Hyperlipidemia and statin intolerance - Managed with Zetia  10 mg daily for past ten years - Persistent high triglycerides and lack of efficacy with Zetia  - Severe muscle pain with simvastatin , unable to tolerate statins  Osteoarthritis and post-surgical joint pain - Affects multiple joints, including hands and knees - Uses celecoxib 200 mg daily as needed for inflammation and pain, especially after activities such as knitting - History of multiple surgeries for tendon repair and carpal tunnel syndrome  Anxiety and sleep disturbance - Experiences anxiety and difficulty sleeping - Uses alprazolam  0.5 mg at night as needed, recently reduced to 0.25 mg to avoid morning grogginess - Wakes at 2 AM if  alprazolam  is not taken     ROS  Past Surgical History:  Procedure Laterality Date   abddominoplasty  1995   APPENDECTOMY  Age 33   BREAST REDUCTION SURGERY  09/2001   & Lift   COLONOSCOPY     EYE SURGERY     cataract bil   ORIF ELBOW FRACTURE Left 03/15/2017   Procedure: OPEN REDUCTION INTERNAL FIXATION (ORIF) ELBOW/OLECRANON FRACTURE;  Surgeon: Celena Sharper, MD;  Location: [facility] OR;  Service: Orthopedics;  Laterality: Left;   PARATHYROIDECTOMY  5/1/112   due to hypercalcemia   REPAIR EXTENSOR TENDON HAND Left 08/14/2023   SHOULDER SURGERY     left rotator cuff; bicep tendon repair on left    Thumb arthroplasty  Right 04/10/2023   THUMB FUSION  08/2007   with bone graft- post trauma Dr. darus   THYROIDECTOMY N/A 10/23/2017   Procedure: TOTAL THYROIDECTOMY;  Surgeon: Eletha Boas, MD;  Location: [facility];  Service: General;  Laterality: N/A;   toatal knee replacement Right 2001   TOTAL KNEE ARTHROPLASTY Left 08/02/2015   Procedure: LEFT TOTAL KNEE ARTHROPLASTY;  Surgeon: Dempsey Moan, MD;  Location: [facility];  Service: Orthopedics;  Laterality: Left;   TOTAL THYROIDECTOMY     Gerkin   TUBAL LIGATION  1994    Outpatient Medications Prior to Visit  Medication Sig Dispense Refill   ALPRAZolam  (XANAX ) 0.5 MG tablet TAKE 1 TABLET (0.5 MG TOTAL) BY MOUTH AT BEDTIME AS NEEDED. 30 tablet 0   Ascorbic Acid (VITAMIN C) 1000 MG tablet Take 1,000 mg by mouth daily.     Berberine Chloride 500 MG CAPS Take 500 mg by mouth 3 (three) times daily with meals.     calcium  carbonate (OSCAL) 1500 (600 Ca) MG TABS tablet Take 600 mg of elemental calcium  by mouth 2 (two) times daily with a meal.     celecoxib (CELEBREX) 200 MG capsule Take 200 mg  by mouth daily as needed.     Cholecalciferol (VITAMIN D ) 50 MCG (2000 UT) tablet Take 2,000 Units by mouth daily.     Cyanocobalamin  (VITAMIN B12) 1000 MCG TBCR 1 tablet Orally Once a day for 30 day(s)     ezetimibe  (ZETIA ) 10 MG tablet Take 1 tablet  (10 mg total) by mouth daily. 90 tablet 1   levothyroxine (SYNTHROID) 88 MCG tablet levothyroxine 88 mcg tablet     lisinopril  (ZESTRIL ) 10 MG tablet TAKE 1 TABLET BY MOUTH EVERY DAY 90 tablet 1   magnesium oxide (MAG-OX) 400 (240 Mg) MG tablet Take 500 mg by mouth daily.     Multiple Vitamin (MULTIVITAMIN) capsule Take by mouth.     Multiple Vitamins-Minerals (CITRACAL +D3 PO) Take 1,000 mg by mouth 2 (two) times daily.     pyridOXINE (VITAMIN B6) 100 MG tablet Take 100 mg by mouth daily.     alendronate  (FOSAMAX ) 70 MG tablet Take 1 tablet (70 mg total) by mouth every 7 (seven) days. Take with a full glass of water on an empty stomach. (Patient not taking: Reported on 03/25/2024) 4 tablet 11   No facility-administered medications prior to visit.   She more Family History  Problem Relation Age of Onset   Cancer Mother    Leukemia Mother 89   Osteoporosis Mother    Coronary artery disease Father    Heart disease Father    Heart attack Father    Hypertension Sister    Osteoporosis Sister    Coronary artery disease Brother    Hypertension Brother    Hypertension Brother    Osteoporosis Maternal Aunt     Social History   Socioeconomic History   Marital status: Married    Spouse name: Not on file   Number of children: Not on file   Years of education: Not on file   Highest education level: Not on file  Occupational History   Not on file  Tobacco Use   Smoking status: Never   Smokeless tobacco: Never  Vaping Use   Vaping status: Never Used  Substance and Sexual Activity   Alcohol use: Yes    Comment: occas glass of wine    Drug use: No   Sexual activity: Not Currently    Partners: Male    Birth control/protection: Post-menopausal  Other Topics Concern   Not on file  Social History Narrative   Not on file   Social Drivers of Health   Tobacco Use: Low Risk (03/25/2024)   Patient History    Smoking Tobacco Use: Never    Smokeless Tobacco Use: Never    Passive  Exposure: Not on file  Financial Resource Strain: Low Risk (11/15/2022)   Overall Financial Resource Strain (CARDIA)    Difficulty of Paying Living Expenses: Not hard at all  Food Insecurity: No Food Insecurity (11/15/2022)   Hunger Vital Sign    Worried About Running Out of Food in the Last Year: Never true    Ran Out of Food in the Last Year: Never true  Transportation Needs: No Transportation Needs (11/15/2022)   PRAPARE - Administrator, Civil Service (Medical): No    Lack of Transportation (Non-Medical): No  Physical Activity: Sufficiently Active (11/15/2022)   Exercise Vital Sign    Days of Exercise per Week: 7 days    Minutes of Exercise per Session: 30 min  Stress: No Stress Concern Present (11/15/2022)   [employer] of Occupational Health - Occupational Stress  Questionnaire    Feeling of Stress : Only a little  Social Connections: Socially Integrated (11/15/2022)   Social Connection and Isolation Panel    Frequency of Communication with Friends and Family: More than three times a week    Frequency of Social Gatherings with Friends and Family: Twice a week    Attends Religious Services: More than 4 times per year    Active Member of Clubs or Organizations: Yes    Attends Banker Meetings: More than 4 times per year    Marital Status: Married  Catering Manager Violence: Not At Risk (11/15/2022)   Humiliation, Afraid, Rape, and Kick questionnaire    Fear of Current or Ex-Partner: No    Emotionally Abused: No    Physically Abused: No    Sexually Abused: No  Depression (PHQ2-9): Low Risk (05/17/2023)   Depression (PHQ2-9)    PHQ-2 Score: 0  Alcohol Screen: Low Risk (11/15/2022)   Alcohol Screen    Last Alcohol Screening Score (AUDIT): 1  Housing: Low Risk (11/15/2022)   Housing    Last Housing Risk Score: 0  Utilities: Not At Risk (11/15/2022)   [employer] Utilities    Threatened with loss of utilities: No  Health Literacy: Adequate Health Literacy  (11/15/2022)   B1300 Health Literacy    Frequency of need for help with medical instructions: Never                                                                                                  Objective:  Physical Exam: BP (!) 143/87 (BP Location: Right Arm, Patient Position: Sitting)   Pulse 81   Ht 5' 4 (1.626 m)   Wt 129 lb (58.5 kg)   LMP 11/10/2014   SpO2 100%   BMI 22.14 kg/m    Physical Exam VITALS: BP- 143/80 GENERAL: Alert, cooperative, well developed, no acute distress HEENT: Normocephalic, normal oropharynx, moist mucous membranes CHEST: Clear to auscultation bilaterally, No wheezes, rhonchi, or crackles CARDIOVASCULAR: Normal heart rate and rhythm, S1 and S2 normal without murmurs ABDOMEN: Soft, non-tender, non-distended, without organomegaly, Normal bowel sounds EXTREMITIES: No cyanosis or edema NEUROLOGICAL: Cranial nerves grossly intact, Moves all extremities without gross motor or sensory deficit  osteoporosis results she back chairback here 5   Latest Reference Range & Units 11/19/23 11:27  Sodium 134 - 144 mmol/L 140  Potassium 3.5 - 5.2 mmol/L 4.7  Chloride 96 - 106 mmol/L 104  CO2 20 - 29 mmol/L 22  Glucose 70 - 99 mg/dL 78  BUN 8 - 27 mg/dL 22  Creatinine 9.42 - 8.99 mg/dL 9.21  Calcium  8.7 - 10.3 mg/dL 9.4  BUN/Creatinine Ratio 12 - 28  28  eGFR >59 mL/min/1.73 81  Alkaline Phosphatase 49 - 135 IU/L 64  Albumin 3.8 - 4.8 g/dL 4.4  AST 0 - 40 IU/L 25  ALT 0 - 32 IU/L 19  Total Protein 6.0 - 8.5 g/dL 6.5  Total Bilirubin 0.0 - 1.2 mg/dL 0.3  Total CHOL/HDL Ratio 0.0 - 4.4 ratio 3.9  Cholesterol, Total 100 - 199 mg/dL 763 (H)  HDL  Cholesterol >39 mg/dL 60  Triglycerides 0 - 850 mg/dL 718 (H)  VLDL Cholesterol Cal 5 - 40 mg/dL 49 (H)  LDL Chol Calc (NIH) 0 - 99 mg/dL 872 (H)  Vitamin D , 25-Hydroxy 30.0 - 100.0 ng/mL 31.0  Globulin, Total 1.5 - 4.5 g/dL 2.1  WBC 3.4 - 89.1 [id number] 6.3  RBC 3.77 - 5.28 x10E6/uL 3.93  Hemoglobin 11.1 - 15.9  g/dL 87.5  HCT 65.9 - 53.3 % 38.0  MCV 79 - 97 fL 97  MCH 26.6 - 33.0 pg 31.6  MCHC 31.5 - 35.7 g/dL 67.3  RDW 88.2 - 84.5 % 13.8  Platelets 150 - 450 x10E3/uL 240  Neutrophils Not Estab. % 61  Immature Granulocytes Not Estab. % 0  NEUT# 1.4 - 7.0 x10E3/uL 3.9  Lymphs Abs 0.7 - 3.1 x10E3/uL 1.7  Monocytes Absolute 0.1 - 0.9 x10E3/uL 0.5  Basophils Absolute 0.0 - 0.2 x10E3/uL 0.1  Immature Grans (Abs) 0.0 - 0.1 x10E3/uL 0.0  Lymphs Not Estab. % 28  Monocytes Not Estab. % 8  Basos Not Estab. % 1  Eos Not Estab. % 2  EOS (ABSOLUTE) 0.0 - 0.4 x10E3/uL 0.1  Hemoglobin A1C 4.8 - 5.6 % 5.4  Est. average glucose Bld gHb Est-mCnc mg/dL 891  (H): Data is abnormally high  No results found.  No results found for this or any previous visit (from the past 2160 hours).      Beverley Adine Hummer, MD, MS "

## 2024-03-31 ENCOUNTER — Ambulatory Visit

## 2024-04-03 ENCOUNTER — Other Ambulatory Visit: Payer: Self-pay | Admitting: Family Medicine

## 2024-04-03 ENCOUNTER — Inpatient Hospital Stay: Admission: RE | Admit: 2024-04-03 | Discharge: 2024-04-03 | Attending: Family Medicine | Admitting: Family Medicine

## 2024-04-03 DIAGNOSIS — Z1231 Encounter for screening mammogram for malignant neoplasm of breast: Secondary | ICD-10-CM

## 2024-04-04 ENCOUNTER — Other Ambulatory Visit: Payer: Self-pay | Admitting: Family

## 2024-04-04 ENCOUNTER — Other Ambulatory Visit: Payer: Self-pay | Admitting: Family Medicine

## 2024-04-04 DIAGNOSIS — F419 Anxiety disorder, unspecified: Secondary | ICD-10-CM

## 2024-04-23 ENCOUNTER — Ambulatory Visit: Admitting: Family Medicine
